# Patient Record
Sex: Female | Born: 1935 | Race: White | Hispanic: No | Smoking: Former smoker
Health system: Southern US, Community
[De-identification: ages and names within clinical notes are randomized; demographics above are authoritative.]

## PROBLEM LIST (undated history)

## (undated) ENCOUNTER — Inpatient Hospital Stay: Admission: EM | Payer: Self-pay | Source: Home / Self Care

## (undated) DIAGNOSIS — G500 Trigeminal neuralgia: Secondary | ICD-10-CM

## (undated) DIAGNOSIS — K760 Fatty (change of) liver, not elsewhere classified: Secondary | ICD-10-CM

## (undated) DIAGNOSIS — I2089 Other forms of angina pectoris: Secondary | ICD-10-CM

## (undated) DIAGNOSIS — I340 Nonrheumatic mitral (valve) insufficiency: Secondary | ICD-10-CM

## (undated) DIAGNOSIS — Z5189 Encounter for other specified aftercare: Secondary | ICD-10-CM

## (undated) DIAGNOSIS — Z974 Presence of external hearing-aid: Secondary | ICD-10-CM

## (undated) DIAGNOSIS — Z972 Presence of dental prosthetic device (complete) (partial): Secondary | ICD-10-CM

## (undated) DIAGNOSIS — J449 Chronic obstructive pulmonary disease, unspecified: Secondary | ICD-10-CM

## (undated) DIAGNOSIS — Z87898 Personal history of other specified conditions: Secondary | ICD-10-CM

## (undated) DIAGNOSIS — I517 Cardiomegaly: Secondary | ICD-10-CM

## (undated) DIAGNOSIS — I350 Nonrheumatic aortic (valve) stenosis: Secondary | ICD-10-CM

## (undated) DIAGNOSIS — H8110 Benign paroxysmal vertigo, unspecified ear: Secondary | ICD-10-CM

## (undated) DIAGNOSIS — J45909 Unspecified asthma, uncomplicated: Secondary | ICD-10-CM

## (undated) DIAGNOSIS — Z8719 Personal history of other diseases of the digestive system: Secondary | ICD-10-CM

## (undated) DIAGNOSIS — K219 Gastro-esophageal reflux disease without esophagitis: Secondary | ICD-10-CM

## (undated) DIAGNOSIS — H04123 Dry eye syndrome of bilateral lacrimal glands: Secondary | ICD-10-CM

## (undated) DIAGNOSIS — H269 Unspecified cataract: Secondary | ICD-10-CM

## (undated) DIAGNOSIS — I10 Essential (primary) hypertension: Secondary | ICD-10-CM

## (undated) DIAGNOSIS — N1831 Chronic kidney disease, stage 3a: Secondary | ICD-10-CM

## (undated) DIAGNOSIS — I5032 Chronic diastolic (congestive) heart failure: Secondary | ICD-10-CM

## (undated) DIAGNOSIS — I509 Heart failure, unspecified: Secondary | ICD-10-CM

## (undated) DIAGNOSIS — R011 Cardiac murmur, unspecified: Secondary | ICD-10-CM

## (undated) DIAGNOSIS — C801 Malignant (primary) neoplasm, unspecified: Secondary | ICD-10-CM

## (undated) DIAGNOSIS — D649 Anemia, unspecified: Secondary | ICD-10-CM

## (undated) DIAGNOSIS — I480 Paroxysmal atrial fibrillation: Secondary | ICD-10-CM

## (undated) DIAGNOSIS — I951 Orthostatic hypotension: Secondary | ICD-10-CM

## (undated) DIAGNOSIS — G459 Transient cerebral ischemic attack, unspecified: Secondary | ICD-10-CM

## (undated) DIAGNOSIS — M1990 Unspecified osteoarthritis, unspecified site: Secondary | ICD-10-CM

## (undated) HISTORY — DX: Encounter for other specified aftercare: Z51.89

## (undated) HISTORY — DX: Cardiac murmur, unspecified: R01.1

## (undated) HISTORY — DX: Anemia, unspecified: D64.9

## (undated) HISTORY — DX: Unspecified asthma, uncomplicated: J45.909

## (undated) HISTORY — PX: KNEE SURGERY: SHX244

## (undated) HISTORY — DX: Unspecified cataract: H26.9

## (undated) HISTORY — DX: Unspecified osteoarthritis, unspecified site: M19.90

## (undated) HISTORY — PX: JOINT REPLACEMENT: SHX530

## (undated) HISTORY — DX: Malignant (primary) neoplasm, unspecified: C80.1

## (undated) HISTORY — PX: APPENDECTOMY: SHX54

## (undated) HISTORY — PX: EYE SURGERY: SHX253

---

## 2004-09-23 DIAGNOSIS — E785 Hyperlipidemia, unspecified: Secondary | ICD-10-CM | POA: Insufficient documentation

## 2005-12-04 DIAGNOSIS — M5489 Other dorsalgia: Secondary | ICD-10-CM | POA: Insufficient documentation

## 2008-10-18 DIAGNOSIS — H8110 Benign paroxysmal vertigo, unspecified ear: Secondary | ICD-10-CM | POA: Insufficient documentation

## 2010-08-09 DIAGNOSIS — E669 Obesity, unspecified: Secondary | ICD-10-CM | POA: Insufficient documentation

## 2013-01-07 DIAGNOSIS — H2510 Age-related nuclear cataract, unspecified eye: Secondary | ICD-10-CM | POA: Insufficient documentation

## 2013-06-28 DIAGNOSIS — M47819 Spondylosis without myelopathy or radiculopathy, site unspecified: Secondary | ICD-10-CM | POA: Insufficient documentation

## 2013-10-29 DIAGNOSIS — N183 Chronic kidney disease, stage 3 unspecified: Secondary | ICD-10-CM | POA: Insufficient documentation

## 2013-10-29 DIAGNOSIS — N1831 Chronic kidney disease, stage 3a: Secondary | ICD-10-CM | POA: Diagnosis present

## 2014-11-18 HISTORY — PX: CATARACT EXTRACTION W/ INTRAOCULAR LENS IMPLANT: SHX1309

## 2015-11-08 DIAGNOSIS — I2510 Atherosclerotic heart disease of native coronary artery without angina pectoris: Secondary | ICD-10-CM | POA: Insufficient documentation

## 2016-03-24 DIAGNOSIS — M5412 Radiculopathy, cervical region: Secondary | ICD-10-CM | POA: Insufficient documentation

## 2016-03-24 DIAGNOSIS — G500 Trigeminal neuralgia: Secondary | ICD-10-CM | POA: Insufficient documentation

## 2016-08-22 DIAGNOSIS — E871 Hypo-osmolality and hyponatremia: Secondary | ICD-10-CM | POA: Insufficient documentation

## 2017-01-01 DIAGNOSIS — Z961 Presence of intraocular lens: Secondary | ICD-10-CM | POA: Insufficient documentation

## 2017-01-01 DIAGNOSIS — H353190 Nonexudative age-related macular degeneration, unspecified eye, stage unspecified: Secondary | ICD-10-CM | POA: Insufficient documentation

## 2017-01-01 DIAGNOSIS — E538 Deficiency of other specified B group vitamins: Secondary | ICD-10-CM | POA: Insufficient documentation

## 2017-02-06 DIAGNOSIS — D649 Anemia, unspecified: Secondary | ICD-10-CM | POA: Insufficient documentation

## 2017-03-10 DIAGNOSIS — K2100 Gastro-esophageal reflux disease with esophagitis, without bleeding: Secondary | ICD-10-CM | POA: Insufficient documentation

## 2017-05-05 DIAGNOSIS — K469 Unspecified abdominal hernia without obstruction or gangrene: Secondary | ICD-10-CM | POA: Insufficient documentation

## 2018-03-09 DIAGNOSIS — E042 Nontoxic multinodular goiter: Secondary | ICD-10-CM | POA: Insufficient documentation

## 2018-03-15 DIAGNOSIS — R55 Syncope and collapse: Secondary | ICD-10-CM | POA: Diagnosis present

## 2018-12-22 DIAGNOSIS — E559 Vitamin D deficiency, unspecified: Secondary | ICD-10-CM | POA: Insufficient documentation

## 2018-12-30 DIAGNOSIS — M810 Age-related osteoporosis without current pathological fracture: Secondary | ICD-10-CM | POA: Insufficient documentation

## 2020-08-08 DIAGNOSIS — R42 Dizziness and giddiness: Secondary | ICD-10-CM | POA: Insufficient documentation

## 2020-10-17 ENCOUNTER — Encounter: Payer: Self-pay | Admitting: Emergency Medicine

## 2020-10-17 ENCOUNTER — Other Ambulatory Visit: Payer: Self-pay

## 2020-10-17 ENCOUNTER — Emergency Department
Admission: EM | Admit: 2020-10-17 | Discharge: 2020-10-17 | Disposition: A | Discharge status: Home / Self Care | Payer: Medicare Other | Attending: Emergency Medicine | Admitting: Emergency Medicine

## 2020-10-17 DIAGNOSIS — Z87891 Personal history of nicotine dependence: Secondary | ICD-10-CM | POA: Insufficient documentation

## 2020-10-17 DIAGNOSIS — I10 Essential (primary) hypertension: Secondary | ICD-10-CM | POA: Diagnosis not present

## 2020-10-17 HISTORY — DX: Essential (primary) hypertension: I10

## 2020-10-17 LAB — CBC
HCT: 35 % — ABNORMAL LOW (ref 36.0–46.0)
Hemoglobin: 11.3 g/dL — ABNORMAL LOW (ref 12.0–15.0)
MCH: 29 pg (ref 26.0–34.0)
MCHC: 32.3 g/dL (ref 30.0–36.0)
MCV: 89.7 fL (ref 80.0–100.0)
Platelets: 243 10*3/uL (ref 150–400)
RBC: 3.9 MIL/uL (ref 3.87–5.11)
RDW: 13.3 % (ref 11.5–15.5)
WBC: 5.4 10*3/uL (ref 4.0–10.5)
nRBC: 0 % (ref 0.0–0.2)

## 2020-10-17 LAB — URINALYSIS, COMPLETE (UACMP) WITH MICROSCOPIC
Bilirubin Urine: NEGATIVE
Glucose, UA: NEGATIVE mg/dL
Hgb urine dipstick: NEGATIVE
Ketones, ur: NEGATIVE mg/dL
Nitrite: NEGATIVE
Protein, ur: NEGATIVE mg/dL
Specific Gravity, Urine: 1.005 (ref 1.005–1.030)
pH: 7 (ref 5.0–8.0)

## 2020-10-17 LAB — BASIC METABOLIC PANEL
Anion gap: 10 (ref 5–15)
BUN: 19 mg/dL (ref 8–23)
CO2: 28 mmol/L (ref 22–32)
Calcium: 8.7 mg/dL — ABNORMAL LOW (ref 8.9–10.3)
Chloride: 101 mmol/L (ref 98–111)
Creatinine, Ser: 1.07 mg/dL — ABNORMAL HIGH (ref 0.44–1.00)
GFR, Estimated: 50 mL/min — ABNORMAL LOW (ref >60)
Glucose, Bld: 107 mg/dL — ABNORMAL HIGH (ref 70–99)
Potassium: 3.9 mmol/L (ref 3.5–5.1)
Sodium: 139 mmol/L (ref 135–145)

## 2020-10-17 NOTE — ED Triage Notes (Signed)
Pt to ED via POV with c/o hypertension. Pt reports home reading of 180 systolic. Denies symptoms at this time, however does report had HTN earlier this week and had a syncopal episode. Pt denies injury from syncopal episode.

## 2020-10-17 NOTE — ED Notes (Signed)
Patient verbalizes understanding of discharge instructions. Opportunity for questioning and answers were provided. Armband removed by staff, pt discharged from ED. Wheeled out to lobby with daughter  

## 2020-10-17 NOTE — ED Notes (Addendum)
See triage note. Pt presents to the ED for HTN. Pt states she is on medication. Pt denies symptoms at this time. Pt is A&Ox4 and NAD.

## 2020-10-17 NOTE — ED Provider Notes (Signed)
[facility] Emergency Department Provider Note   ____________________________________________   First MD Initiated Contact with Patient 10/17/20 1359     (approximate)  I have reviewed the triage vital signs and the nursing notes.   HISTORY  Chief Complaint Hypertension    HPI Tricia Edwards is a 86 y.o. female with past medical history of orthostatic hypotension and trigeminal neuralgia who presents to the ED complaining of hypertension.  Majority of history is obtained from patient's daughter, who states that the patient's blood pressure has been running high for the past 3 to 4 days.  She initially had a near syncopal episode 5 days ago, which is not unusual for her as she often deals with lightheadedness when rising to a standing position.  She had just stood up and was walking down the hallway, when she started to feel lightheaded and nearly passed out.  She did not have any chest pain or shortness of breath with this episode.  She typically takes Florinef and midodrine to avoid these episodes.  Daughter then noticed that patient's blood pressure has been running high over the past few days.  She has otherwise been feeling well, has not had any further episodes of lightheadedness or near syncope.  She denies any headaches, numbness, or weakness.  She denies any pain.        Past Medical History:  Diagnosis Date  . Hypertension     There are no problems to display for this patient.   Past Surgical History:  Procedure Laterality Date  . APPENDECTOMY    . KNEE SURGERY      Prior to Admission medications   Not on File    Allergies Pneumovax [pneumococcal polysaccharide vaccine] and Pneumococcal vaccine  No family history on file.  Social History Social History   Tobacco Use  . Smoking status: Former Games developer  . Smokeless tobacco: Never Used  Substance Use Topics  . Alcohol use: Not Currently  . Drug use: Not Currently    Review of  Systems  Constitutional: No fever/chills Eyes: No visual changes. ENT: No sore throat. Cardiovascular: Denies chest pain.  Positive for lightheadedness and syncope. Respiratory: Denies shortness of breath. Gastrointestinal: No abdominal pain.  No nausea, no vomiting.  No diarrhea.  No constipation. Genitourinary: Negative for dysuria. Musculoskeletal: Negative for back pain. Skin: Negative for rash. Neurological: Negative for headaches, focal weakness or numbness.  ____________________________________________   PHYSICAL EXAM:  VITAL SIGNS: ED Triage Vitals  Enc Vitals Group     BP 10/17/20 1320 (!) 177/82     Pulse Rate 10/17/20 1320 74     Resp 10/17/20 1320 18     Temp 10/17/20 1320 98.2 F (36.8 C)     Temp Source 10/17/20 1320 Oral     SpO2 10/17/20 1320 98 %     Weight 10/17/20 1313 196 lb (88.9 kg)     Height 10/17/20 1313 5\' 7"  (1.702 m)     Head Circumference --      Peak Flow --      Pain Score 10/17/20 1313 0     Pain Loc --      Pain Edu? --      Excl. in GC? --     Constitutional: Alert and oriented. Eyes: Conjunctivae are normal. Head: Atraumatic. Nose: No congestion/rhinnorhea. Mouth/Throat: Mucous membranes are moist. Neck: Normal ROM Cardiovascular: Normal rate, regular rhythm. Grossly normal heart sounds.  2+ radial pulses bilaterally. Respiratory: Normal respiratory effort.  No retractions.  Lungs CTAB. Gastrointestinal: Soft and nontender. No distention. Genitourinary: deferred Musculoskeletal: No lower extremity tenderness nor edema. Neurologic:  Normal speech and language. No gross focal neurologic deficits are appreciated. Skin:  Skin is warm, dry and intact. No rash noted. Psychiatric: Mood and affect are normal. Speech and behavior are normal.  ____________________________________________   LABS (all labs ordered are listed, but only abnormal results are displayed)  Labs Reviewed  BASIC METABOLIC PANEL - Abnormal; Notable for the  following components:      Result Value   Glucose, Bld 107 (*)    Creatinine, Ser 1.07 (*)    Calcium 8.7 (*)    GFR, Estimated 50 (*)    All other components within normal limits  CBC - Abnormal; Notable for the following components:   Hemoglobin 11.3 (*)    HCT 35.0 (*)    All other components within normal limits  URINALYSIS, COMPLETE (UACMP) WITH MICROSCOPIC - Abnormal; Notable for the following components:   Color, Urine STRAW (*)    APPearance CLEAR (*)    Leukocytes,Ua MODERATE (*)    Bacteria, UA RARE (*)    All other components within normal limits   ____________________________________________  EKG  ED ECG REPORT I, [name], the attending physician, personally viewed and interpreted this ECG.   Date: 10/17/2020  EKG Time: 13:16  Rate: 73  Rhythm: normal EKG, normal sinus rhythm, unchanged from previous tracings  Axis: Normal  Intervals:none  ST&T Change: None   PROCEDURES  Procedure(s) performed (including Critical Care):  Procedures   ____________________________________________   INITIAL IMPRESSION / ASSESSMENT AND PLAN / ED COURSE       86 year old female with past medical history of orthostatic hypotension and trigeminal neuralgia who presents to the ED complaining of elevated blood pressure for the past few days along with a syncopal episode earlier this week.  Patient is asymptomatic at this time, states that the lightheadedness and syncopal episode earlier this week is very typical for her with her orthostatic hypotension.  She has no focal neurologic deficits on exam to suggest stroke, no headache to raise concern for intracranial hemorrhage.  She denies any chest pain or shortness of breath, EKG shows no evidence of arrhythmia or ischemia.  Lab work is unremarkable, shows some elevation in creatinine that is similar to previous.  There is no evidence of hypertensive emergency at this time.  Daughter counseled to have patient hold midodrine  unless the patient's blood pressure is low, otherwise schedule follow-up with her PCP back in [place].  Patient and daughter agree with plan.      ____________________________________________   FINAL CLINICAL IMPRESSION(S) / ED DIAGNOSES  Final diagnoses:  Hypertension, unspecified type     ED Discharge Orders    None       Note:  This document was prepared using Dragon voice recognition software and may include unintentional dictation errors.   [name], MD 10/17/20 1438

## 2021-01-08 ENCOUNTER — Emergency Department
Admission: EM | Admit: 2021-01-08 | Discharge: 2021-01-08 | Disposition: A | Discharge status: Home / Self Care | Payer: Medicare Other | Attending: Emergency Medicine | Admitting: Emergency Medicine

## 2021-01-08 ENCOUNTER — Emergency Department: Payer: Medicare Other

## 2021-01-08 ENCOUNTER — Other Ambulatory Visit: Payer: Self-pay

## 2021-01-08 DIAGNOSIS — I10 Essential (primary) hypertension: Secondary | ICD-10-CM | POA: Insufficient documentation

## 2021-01-08 DIAGNOSIS — Z87891 Personal history of nicotine dependence: Secondary | ICD-10-CM | POA: Insufficient documentation

## 2021-01-08 DIAGNOSIS — R55 Syncope and collapse: Secondary | ICD-10-CM | POA: Insufficient documentation

## 2021-01-08 DIAGNOSIS — E860 Dehydration: Secondary | ICD-10-CM | POA: Diagnosis not present

## 2021-01-08 LAB — CBC
HCT: 31.5 % — ABNORMAL LOW (ref 36.0–46.0)
Hemoglobin: 10.1 g/dL — ABNORMAL LOW (ref 12.0–15.0)
MCH: 28.5 pg (ref 26.0–34.0)
MCHC: 32.1 g/dL (ref 30.0–36.0)
MCV: 88.7 fL (ref 80.0–100.0)
Platelets: 242 10*3/uL (ref 150–400)
RBC: 3.55 MIL/uL — ABNORMAL LOW (ref 3.87–5.11)
RDW: 13.6 % (ref 11.5–15.5)
WBC: 5.3 10*3/uL (ref 4.0–10.5)
nRBC: 0 % (ref 0.0–0.2)

## 2021-01-08 LAB — BASIC METABOLIC PANEL
Anion gap: 9 (ref 5–15)
BUN: 19 mg/dL (ref 8–23)
CO2: 25 mmol/L (ref 22–32)
Calcium: 8.6 mg/dL — ABNORMAL LOW (ref 8.9–10.3)
Chloride: 100 mmol/L (ref 98–111)
Creatinine, Ser: 1.24 mg/dL — ABNORMAL HIGH (ref 0.44–1.00)
GFR, Estimated: 42 mL/min — ABNORMAL LOW (ref >60)
Glucose, Bld: 133 mg/dL — ABNORMAL HIGH (ref 70–99)
Potassium: 3.5 mmol/L (ref 3.5–5.1)
Sodium: 134 mmol/L — ABNORMAL LOW (ref 135–145)

## 2021-01-08 LAB — TROPONIN I (HIGH SENSITIVITY)
Troponin I (High Sensitivity): 20 ng/L — ABNORMAL HIGH (ref <18)
Troponin I (High Sensitivity): 17 ng/L (ref <18)

## 2021-01-08 MED ORDER — MIDODRINE HCL 5 MG PO TABS
2.5000 mg | ORAL_TABLET | ORAL | Status: DC
  Filled 2021-01-08 (×2): qty 0.5

## 2021-01-08 MED ORDER — LACTATED RINGERS IV BOLUS
1000.0000 mL | Freq: Once | INTRAVENOUS | Status: AC
  Administered 2021-01-08: 1000 mL via INTRAVENOUS

## 2021-01-08 NOTE — ED Triage Notes (Signed)
Pt comes with c/o weakness and LOC. Family reports pt usually puts her head back breaths heavy and then passes out. Pt states fatigue following incidents.  Pt denies any CP or SOB. Pt states some nausea.

## 2021-01-08 NOTE — ED Provider Notes (Signed)
[facility] Emergency Department Provider Note  ____________________________________________   Event Date/Time   First MD Initiated Contact with Patient 01/08/21 1704     (approximate)  I have reviewed the triage vital signs and the nursing notes.   HISTORY  Chief Complaint Weakness and Loss of Consciousness   HPI Tricia Edwards is a 87 y.o. 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  Patient's blood pressure reportedly is often in the 90s but will sometimes spike up into the 180s although she is not on any blood pressure lowering medications.  She seemed to be in her usual state of health this morning when she used her exercise bike as usual and went shopping with her daughter but shortly after lunch when they were stopping at home for bathroom break she stated she felt dizzy and was quickly lowered to a chair by family before losing consciousness for less than a minute.  She did not fall or hit her head and has not had any other recent falls or injuries.  When she woke up she still felt like her normal self.  She states she remembers feeling a little bit nauseous before passing out but no other recent or preceding symptoms.  She denies any headache, earache, vision changes, chest pain, cough, shortness of breath, abdominal pain, vomiting, diarrhea, dysuria, rash or focal extremity pain weakness numbness or tingling.  Similar episode did occur last week.  Patient is visiting from [place].       Past Medical History:  Diagnosis Date  . Hypertension     There are no problems to display for this patient.   Past Surgical History:  Procedure Laterality Date  . APPENDECTOMY    . KNEE SURGERY      Prior to Admission medications   Not on File    Allergies Pneumovax  [pneumococcal polysaccharide vaccine] and Pneumococcal vaccine  No family history on file.  Social History Social History   Tobacco Use  . Smoking status: Former Games developer  . Smokeless tobacco: Never Used  Substance Use Topics  . Alcohol use: Not Currently  . Drug use: Not Currently    Review of Systems  Review of Systems  Constitutional: Negative for chills and fever.  HENT: Negative for sore throat.   Eyes: Negative for pain.  Respiratory: Negative for cough and stridor.   Cardiovascular: Negative for chest pain.  Gastrointestinal: Negative for vomiting.  Genitourinary: Negative for dysuria.  Musculoskeletal: Negative for myalgias.  Skin: Negative for rash.  Neurological: Positive for loss of consciousness. Negative for seizures and headaches.  Psychiatric/Behavioral: Negative for suicidal ideas.  All other systems reviewed and are negative.     ____________________________________________   PHYSICAL EXAM:  VITAL SIGNS: ED Triage Vitals  Enc Vitals Group     BP 01/08/21 1355 104/64     Pulse Rate 01/08/21 1355 83     Resp 01/08/21 1355 18     Temp 01/08/21 1355 98.1 F (36.7 C)     Temp Source 01/08/21 1646 Oral     SpO2 01/08/21 1355 100 %     Weight --      Height --      Head Circumference --      Peak Flow --      Pain Score 01/08/21 1356 0  Pain Loc --      Pain Edu? --      Excl. in GC? --    Vitals:   01/08/21 1958 01/08/21 2030  BP: (!) 153/116 (!) 199/92  Pulse: 74 74  Resp:  20  Temp:    SpO2:  99%   Physical Exam Vitals and nursing note reviewed.  Constitutional:      General: She is not in acute distress.    Appearance: She is well-developed and well-nourished.  HENT:     Head: Normocephalic and atraumatic.     Right Ear: External ear normal.     Left Ear: External ear normal.     Nose: Nose normal.     Mouth/Throat:     Mouth: Mucous membranes are dry.  Eyes:     Conjunctiva/sclera: Conjunctivae normal.  Cardiovascular:      Rate and Rhythm: Normal rate and regular rhythm.     Heart sounds: No murmur heard.   Pulmonary:     Effort: Pulmonary effort is normal. No respiratory distress.     Breath sounds: Normal breath sounds.  Abdominal:     Palpations: Abdomen is soft.     Tenderness: There is no abdominal tenderness.  Musculoskeletal:        General: No edema.     Cervical back: Neck supple.  Skin:    General: Skin is warm and dry.     Capillary Refill: Capillary refill takes 2 to 3 seconds.  Neurological:     Mental Status: She is alert and oriented to person, place, and time.  Psychiatric:        Mood and Affect: Mood and affect and mood normal.     Cranial nerves II through XII grossly intact.  No pronator drift.  No finger dysmetria.  Symmetric 5/5 strength of all extremities.  Sensation intact to light touch in all extremities.  Unremarkable unassisted gait.  ____________________________________________   LABS (all labs ordered are listed, but only abnormal results are displayed)  Labs Reviewed  BASIC METABOLIC PANEL - Abnormal; Notable for the following components:      Result Value   Sodium 134 (*)    Glucose, Bld 133 (*)    Creatinine, Ser 1.24 (*)    Calcium 8.6 (*)    GFR, Estimated 42 (*)    All other components within normal limits  CBC - Abnormal; Notable for the following components:   RBC 3.55 (*)    Hemoglobin 10.1 (*)    HCT 31.5 (*)    All other components within normal limits  TROPONIN I (HIGH SENSITIVITY) - Abnormal; Notable for the following components:   Troponin I (High Sensitivity) 20 (*)    All other components within normal limits  URINALYSIS, COMPLETE (UACMP) WITH MICROSCOPIC  CBG MONITORING, ED  TROPONIN I (HIGH SENSITIVITY)   ____________________________________________  EKG  Sinus rhythm ventricular rate of 79, normal axis, unremarkable intervals, no clear evidence of acute ischemia or other significant underlying arrhythmia although is a nonspecific  change in lead III. ____________________________________________  RADIOLOGY  ED MD interpretation: No clear focal consolidation, effusion, significant edema or any other clear acute intrathoracic process.  Official radiology report(s): DG Chest 2 View  Result Date: 01/08/2021 CLINICAL DATA:  Weakness, syncope, loss of consciousness, fatigue, nausea EXAM: CHEST - 2 VIEW COMPARISON:  None FINDINGS: Upper normal size of cardiac silhouette. Calcified tortuous aorta. Mediastinal contours and pulmonary vascularity normal. Lungs clear. Mild peribronchial thickening. No pleural effusion or pneumothorax. Bones  demineralized with endplate spur formation thoracic spine. IMPRESSION: Bronchitic changes without infiltrate. Aortic Atherosclerosis (ICD10-I70.0). Electronically Signed   By: Ulyses Southward M.D.   On: 01/08/2021 16:19    ____________________________________________   PROCEDURES  Procedure(s) performed (including Critical Care):  Procedures   ____________________________________________   INITIAL IMPRESSION / ASSESSMENT AND PLAN / ED COURSE      Patient presents with above to history and exam with history of labile blood pressures on midodrine and fludrocortisone at baseline but not recently missed doses after a syncopal episode preceded by some dizziness and slightly decreased p.o. intake per patient and daughter.  On arrival she is slightly soft blood pressures in 9/61 otherwise stable vital signs on room air.   Suspect patient may have been orthostatic secondary to poor p.o. intake.  She has no chest pain ECG findings or elevated troponin to suggest ACS.  No evidence of arrhythmia on ECG.  Chest x-ray shows no evidence of heart failure or other acute intrathoracic process and there were no other historical or exam findings to suggest acute cardiomyopathy.  No focal deficits on exam to suggest CVA.  BMP shows a creatinine of 1.24 compared to 1.072 months ago and no other significant  derangements.  CBC shows no leukocytosis and hemoglobin of 10.1 close to baseline 11.32 months ago.  Low suspicion for acute symptomatic anemia.  No fever or other findings on exam or history to suggest acute infectious process.  Following IV hydration patient's blood pressure improved on recheck and she was not orthostatic.  She actually trended to the hypertensive range.  However she remained asymptomatic without any focal deficits.  In addition she was able to ambulate with some assistance and denies any lightheadedness dizziness or other acute sick symptoms.  Given reassuring exam and work-up and likely orthostatic etiology for her syncopal episode of lucency for discharge with plan for continued outpatient follow-up.  Discharged stable condition.  Strict term cautions advised and discussed  ____________________________________________   FINAL CLINICAL IMPRESSION(S) / ED DIAGNOSES  Final diagnoses:  Syncope and collapse  Dehydration    Medications  midodrine (PROAMATINE) tablet 2.5 mg (2.5 mg Oral Not Given 01/08/21 1955)  lactated ringers bolus 1,000 mL (0 mLs Intravenous Stopped 01/08/21 2036)     ED Discharge Orders    None       Note:  This document was prepared using Dragon voice recognition software and may include unintentional dictation errors.   Gilles Chiquito, MD 01/08/21 2106

## 2021-01-08 NOTE — ED Notes (Signed)
Pt and daughter, who is at bedside, agreeable with d/c plan as discussed by provider- this nurse has verbally reinforced d/c instructions and provided pt with written copy - pt acknowledges verbal understanding and denies any additional questions, concerns, needs.  Escorted to waiting room via w/c with daughter to await ride- enroute; ETA 5 minutes

## 2021-01-08 NOTE — ED Triage Notes (Addendum)
Pt comes via EMS from home with c/o syncopal episode for few minutes. Witnessed by family. Family states they were not able to get to respond. Pt has had two episodes for last week of this.  EMS reports 190/90 for BP, HR-86 EKG unremarkable.  CBG-129, 20g in IV

## 2021-01-08 NOTE — ED Notes (Signed)
Patient transported to X-ray 

## 2021-01-18 ENCOUNTER — Observation Stay (HOSPITAL_BASED_OUTPATIENT_CLINIC_OR_DEPARTMENT_OTHER)
Admit: 2021-01-18 | Discharge: 2021-01-18 | Disposition: A | Discharge status: Home / Self Care | Payer: Medicare Other | Attending: Internal Medicine | Admitting: Internal Medicine

## 2021-01-18 ENCOUNTER — Emergency Department: Payer: Medicare Other

## 2021-01-18 ENCOUNTER — Observation Stay
Admission: EM | Admit: 2021-01-18 | Discharge: 2021-01-19 | Disposition: A | Discharge status: Home / Self Care | Payer: Medicare Other | Attending: Obstetrics and Gynecology | Admitting: Obstetrics and Gynecology

## 2021-01-18 ENCOUNTER — Other Ambulatory Visit: Payer: Self-pay

## 2021-01-18 DIAGNOSIS — I951 Orthostatic hypotension: Secondary | ICD-10-CM | POA: Diagnosis not present

## 2021-01-18 DIAGNOSIS — I10 Essential (primary) hypertension: Secondary | ICD-10-CM | POA: Insufficient documentation

## 2021-01-18 DIAGNOSIS — Z87891 Personal history of nicotine dependence: Secondary | ICD-10-CM | POA: Diagnosis not present

## 2021-01-18 DIAGNOSIS — E876 Hypokalemia: Secondary | ICD-10-CM | POA: Diagnosis not present

## 2021-01-18 DIAGNOSIS — Z79899 Other long term (current) drug therapy: Secondary | ICD-10-CM | POA: Diagnosis not present

## 2021-01-18 DIAGNOSIS — R55 Syncope and collapse: Principal | ICD-10-CM | POA: Insufficient documentation

## 2021-01-18 DIAGNOSIS — Z20822 Contact with and (suspected) exposure to covid-19: Secondary | ICD-10-CM | POA: Diagnosis not present

## 2021-01-18 DIAGNOSIS — Z8669 Personal history of other diseases of the nervous system and sense organs: Secondary | ICD-10-CM

## 2021-01-18 LAB — ECHOCARDIOGRAM COMPLETE
AR max vel: 3.16 cm2
AV Area VTI: 4.03 cm2
AV Area mean vel: 3.39 cm2
AV Mean grad: 3 mmHg
AV Peak grad: 5.3 mmHg
Ao pk vel: 1.15 m/s
Area-P 1/2: 3.21 cm2
Height: 67 in
S' Lateral: 2.89 cm
Weight: 3135.82 oz

## 2021-01-18 LAB — CBC WITH DIFFERENTIAL/PLATELET
Abs Immature Granulocytes: 0.04 10*3/uL (ref 0.00–0.07)
Basophils Absolute: 0 10*3/uL (ref 0.0–0.1)
Basophils Relative: 0 %
Eosinophils Absolute: 0.1 10*3/uL (ref 0.0–0.5)
Eosinophils Relative: 1 %
HCT: 31.2 % — ABNORMAL LOW (ref 36.0–46.0)
Hemoglobin: 10 g/dL — ABNORMAL LOW (ref 12.0–15.0)
Immature Granulocytes: 1 %
Lymphocytes Relative: 28 %
Lymphs Abs: 1.6 10*3/uL (ref 0.7–4.0)
MCH: 28.5 pg (ref 26.0–34.0)
MCHC: 32.1 g/dL (ref 30.0–36.0)
MCV: 88.9 fL (ref 80.0–100.0)
Monocytes Absolute: 0.7 10*3/uL (ref 0.1–1.0)
Monocytes Relative: 12 %
Neutro Abs: 3.3 10*3/uL (ref 1.7–7.7)
Neutrophils Relative %: 58 %
Platelets: 226 10*3/uL (ref 150–400)
RBC: 3.51 MIL/uL — ABNORMAL LOW (ref 3.87–5.11)
RDW: 13.8 % (ref 11.5–15.5)
WBC: 5.7 10*3/uL (ref 4.0–10.5)
nRBC: 0 % (ref 0.0–0.2)

## 2021-01-18 LAB — BASIC METABOLIC PANEL
Anion gap: 10 (ref 5–15)
BUN: 19 mg/dL (ref 8–23)
CO2: 26 mmol/L (ref 22–32)
Calcium: 8.2 mg/dL — ABNORMAL LOW (ref 8.9–10.3)
Chloride: 103 mmol/L (ref 98–111)
Creatinine, Ser: 1.12 mg/dL — ABNORMAL HIGH (ref 0.44–1.00)
GFR, Estimated: 47 mL/min — ABNORMAL LOW (ref >60)
Glucose, Bld: 134 mg/dL — ABNORMAL HIGH (ref 70–99)
Potassium: 2.8 mmol/L — ABNORMAL LOW (ref 3.5–5.1)
Sodium: 139 mmol/L (ref 135–145)

## 2021-01-18 LAB — URINALYSIS, COMPLETE (UACMP) WITH MICROSCOPIC
Bacteria, UA: NONE SEEN
Bilirubin Urine: NEGATIVE
Glucose, UA: NEGATIVE mg/dL
Hgb urine dipstick: NEGATIVE
Ketones, ur: NEGATIVE mg/dL
Nitrite: NEGATIVE
Protein, ur: NEGATIVE mg/dL
Specific Gravity, Urine: 1.01 (ref 1.005–1.030)
pH: 7 (ref 5.0–8.0)

## 2021-01-18 LAB — TROPONIN I (HIGH SENSITIVITY)
Troponin I (High Sensitivity): 29 ng/L — ABNORMAL HIGH (ref <18)
Troponin I (High Sensitivity): 140 ng/L (ref <18)
Troponin I (High Sensitivity): 149 ng/L (ref <18)
Troponin I (High Sensitivity): 131 ng/L (ref <18)

## 2021-01-18 LAB — MAGNESIUM: Magnesium: 2.1 mg/dL (ref 1.7–2.4)

## 2021-01-18 LAB — SARS CORONAVIRUS 2 (TAT 6-24 HRS): SARS Coronavirus 2: NEGATIVE

## 2021-01-18 MED ORDER — MIDODRINE HCL 5 MG PO TABS
2.5000 mg | ORAL_TABLET | Freq: Three times a day (TID) | ORAL | Status: DC
  Administered 2021-01-18 – 2021-01-19 (×3): 2.5 mg via ORAL
  Filled 2021-01-18 (×3): qty 0.5
  Filled 2021-01-18: qty 1

## 2021-01-18 MED ORDER — CARBAMAZEPINE ER 200 MG PO TB12
200.0000 mg | ORAL_TABLET | Freq: Three times a day (TID) | ORAL | Status: DC
  Administered 2021-01-18 – 2021-01-19 (×3): 200 mg via ORAL
  Filled 2021-01-18 (×5): qty 1

## 2021-01-18 MED ORDER — ACETAMINOPHEN 650 MG RE SUPP: 650.0000 mg | Freq: Four times a day (QID) | RECTAL | Status: DC | PRN

## 2021-01-18 MED ORDER — SODIUM CHLORIDE 0.9% FLUSH
3.0000 mL | Freq: Two times a day (BID) | INTRAVENOUS | Status: DC
  Administered 2021-01-18 – 2021-01-19 (×2): 3 mL via INTRAVENOUS

## 2021-01-18 MED ORDER — PRAVASTATIN SODIUM 40 MG PO TABS
40.0000 mg | ORAL_TABLET | Freq: Every day | ORAL | Status: DC
  Administered 2021-01-18: 40 mg via ORAL
  Filled 2021-01-18 (×2): qty 1

## 2021-01-18 MED ORDER — ASPIRIN 81 MG PO CHEW
81.0000 mg | CHEWABLE_TABLET | Freq: Every day | ORAL | Status: DC
  Administered 2021-01-18 – 2021-01-19 (×2): 81 mg via ORAL
  Filled 2021-01-18 (×2): qty 1

## 2021-01-18 MED ORDER — FERROUS SULFATE 325 (65 FE) MG PO TABS
324.0000 mg | ORAL_TABLET | ORAL | Status: DC
  Administered 2021-01-19: 324 mg via ORAL
  Filled 2021-01-18: qty 1

## 2021-01-18 MED ORDER — ONDANSETRON HCL 4 MG PO TABS: 4.0000 mg | ORAL_TABLET | Freq: Four times a day (QID) | ORAL | Status: DC | PRN

## 2021-01-18 MED ORDER — SODIUM CHLORIDE 0.9 % IV SOLN: Freq: Once | INTRAVENOUS | Status: AC

## 2021-01-18 MED ORDER — PANTOPRAZOLE SODIUM 40 MG PO TBEC
40.0000 mg | DELAYED_RELEASE_TABLET | Freq: Every day | ORAL | Status: DC
  Administered 2021-01-18 – 2021-01-19 (×2): 40 mg via ORAL
  Filled 2021-01-18 (×2): qty 1

## 2021-01-18 MED ORDER — SODIUM CHLORIDE 0.9 % IV SOLN: INTRAVENOUS | Status: DC

## 2021-01-18 MED ORDER — ACETAMINOPHEN 325 MG PO TABS: 650.0000 mg | ORAL_TABLET | Freq: Four times a day (QID) | ORAL | Status: DC | PRN

## 2021-01-18 MED ORDER — POTASSIUM CHLORIDE CRYS ER 20 MEQ PO TBCR
40.0000 meq | EXTENDED_RELEASE_TABLET | Freq: Once | ORAL | Status: AC
  Administered 2021-01-18: 40 meq via ORAL
  Filled 2021-01-18: qty 2

## 2021-01-18 MED ORDER — ONDANSETRON HCL 4 MG/2ML IJ SOLN: 4.0000 mg | Freq: Four times a day (QID) | INTRAMUSCULAR | Status: DC | PRN

## 2021-01-18 MED ORDER — CALCIUM CARB-CHOLECALCIFEROL 600-400 MG-UNIT PO CHEW: CHEWABLE_TABLET | Freq: Every day | ORAL | Status: DC

## 2021-01-18 MED ORDER — ENOXAPARIN SODIUM 40 MG/0.4ML ~~LOC~~ SOLN
40.0000 mg | SUBCUTANEOUS | Status: DC
  Administered 2021-01-18: 40 mg via SUBCUTANEOUS
  Filled 2021-01-18: qty 0.4

## 2021-01-18 MED ORDER — CALCIUM CARBONATE-VITAMIN D 500-200 MG-UNIT PO TABS
1.0000 | ORAL_TABLET | Freq: Two times a day (BID) | ORAL | Status: DC
  Administered 2021-01-18 – 2021-01-19 (×3): 1 via ORAL
  Filled 2021-01-18 (×4): qty 1

## 2021-01-18 MED ORDER — POLYETHYLENE GLYCOL 3350 17 G PO PACK
17.0000 g | PACK | Freq: Every day | ORAL | Status: DC
  Administered 2021-01-19: 17 g via ORAL
  Filled 2021-01-18 (×2): qty 1

## 2021-01-18 MED ORDER — FLUDROCORTISONE ACETATE 0.1 MG PO TABS
0.1000 mg | ORAL_TABLET | Freq: Every day | ORAL | Status: DC
  Administered 2021-01-18 – 2021-01-19 (×2): 0.1 mg via ORAL
  Filled 2021-01-18 (×2): qty 1

## 2021-01-18 NOTE — ED Triage Notes (Signed)
PT ARRIVES VIA [facility]. PER EMS, CALLED OUT D/T POSSIBLE SYNCOPAL EPISODE AND "SEIZURE LIKE ACTIVITY". PT DENIES PAIN AND C/O NAUSEA. NO HX OF SEIZURES. PT HAS HAD THIS EPISODE MULTIPLE TIMES WITHIN THE LAST MONTH.

## 2021-01-18 NOTE — ED Notes (Signed)
Inpatient RN receiving patient denies questions. Patient awaiting transport to floor by Kathlen Mody, RN.

## 2021-01-18 NOTE — ED Provider Notes (Signed)
[facility] Emergency Department Provider Note   ____________________________________________   I have reviewed the triage vital signs and the nursing notes.   HISTORY  Chief Complaint Near Syncope   History limited by: Not Limited   HPI Tricia Edwards is a 87 y.o. female who presents to the emergency department today because of concerns for a syncopal episode.  The daughter states that she was with her mother and she just walked to the bathroom.  She started feeling dizzy.  She sat down in her walker and then the daughter states she went limp.  The daughter also felt like the patient was doing heavy breathing at this time.  She called 911 and was instructed to place her mother on the ground which she did.  She states that after that her breathing did improve and then her mother regained consciousness.  This is now the third time a similar episode is happened in the past 2 weeks.  Patient had no complaints of any chest pain.  Patient does have issues with blood pressure control and will have varying blood pressures.  Is on midodrine but has not had a dose this morning. No fevers.    Records reviewed. Per medical record review patient has a history of recent ER visits because of concern for near syncope and syncope.   Past Medical History:  Diagnosis Date  . Hypertension     There are no problems to display for this patient.   Past Surgical History:  Procedure Laterality Date  . APPENDECTOMY    . KNEE SURGERY      Prior to Admission medications   Not on File    Allergies Pneumovax [pneumococcal polysaccharide vaccine] and Pneumococcal vaccine  History reviewed. No pertinent family history.  Social History Social History   Tobacco Use  . Smoking status: Former Games developer  . Smokeless tobacco: Never Used  Substance Use Topics  . Alcohol use: Not Currently  . Drug use: Not Currently    Review of Systems Constitutional: No fever/chills Eyes:  No visual changes. ENT: No sore throat. Cardiovascular: Denies chest pain. Respiratory: Denies shortness of breath. Gastrointestinal: No abdominal pain.  No nausea, no vomiting.  No diarrhea.   Genitourinary: Negative for dysuria. Musculoskeletal: Positive for left hip pain.  Skin: Negative for rash. Neurological: Positive for dizziness and syncope.  ____________________________________________   PHYSICAL EXAM:  VITAL SIGNS: ED Triage Vitals  Enc Vitals Group     BP 01/18/21 0755 (!) 169/87     Pulse Rate 01/18/21 0755 88     Resp 01/18/21 0755 15     Temp 01/18/21 0755 (!) 97.3 F (36.3 C)     Temp Source 01/18/21 0755 Oral     SpO2 01/18/21 0755 96 %     Weight 01/18/21 0757 195 lb 15.8 oz (88.9 kg)     Height 01/18/21 0757 5\' 7"  (1.702 m)     Head Circumference --      Peak Flow --      Pain Score 01/18/21 0757 0   Constitutional: Alert and oriented.  Eyes: Conjunctivae are normal.  ENT      Head: Normocephalic and atraumatic.      Nose: No congestion/rhinnorhea.      Mouth/Throat: Mucous membranes are moist.      Neck: No stridor. Hematological/Lymphatic/Immunilogical: No cervical lymphadenopathy. Cardiovascular: Normal rate, regular rhythm.  No murmurs, rubs, or gallops.  Respiratory: Normal respiratory effort without tachypnea nor retractions. Breath sounds are clear and equal  bilaterally. No wheezes/rales/rhonchi. Gastrointestinal: Soft and non tender. No rebound. No guarding.  Genitourinary: Deferred Musculoskeletal: Normal range of motion in all extremities. No lower extremity edema. Neurologic:  Normal speech and language. No gross focal neurologic deficits are appreciated.  Skin:  Skin is warm, dry and intact. No rash noted. Psychiatric: Mood and affect are normal. Speech and behavior are normal. Patient exhibits appropriate insight and judgment.  ____________________________________________    LABS (pertinent positives/negatives)  BMP na 139, k 2.8, glu  134, cr 1.12 Trop hs 29 CBC wbc 5.7, hgb 10.0, plt 226  ____________________________________________   EKG  I, Phineas Semen, attending physician, personally viewed and interpreted this EKG  EKG Time: 0754 Rate: 88 Rhythm: sinus rhythm Axis: left axis deviation Intervals: qtc 478 QRS: narrow ST changes: no st elevation Impression: abnormal ekg  ____________________________________________    RADIOLOGY  CXR No acute abnormality  ____________________________________________   PROCEDURES  Procedures  ____________________________________________   INITIAL IMPRESSION / ASSESSMENT AND PLAN / ED COURSE  Pertinent labs & imaging results that were available during my care of the patient were reviewed by me and considered in my medical decision making (see chart for details).  Patient presented to the emergency department today because of concerns for syncopal episode.  The time my exam patient is awake and alert.  Blood work does show mild hypokalemia.  No obvious etiology of the patient's syncope however given that this is now the third time it has happened in 2 weeks to feel patient would benefit from prolonged monitoring and further work-up.  Discussed this with patient and family.  Will plan admission to the hospitalist service.  ____________________________________________   FINAL CLINICAL IMPRESSION(S) / ED DIAGNOSES  Final diagnoses:  Syncope, unspecified syncope type     Note: This dictation was prepared with Dragon dictation. Any transcriptional errors that result from this process are unintentional     Phineas Semen, MD 01/18/21 1035

## 2021-01-18 NOTE — ED Notes (Signed)
Attempt made to contact patient's daughter to update her about bed assignment at phone number provided on demographics sheet. No answer received.

## 2021-01-18 NOTE — ED Notes (Signed)
Patient transported on monitor to floor by Connye Burkitt, RN.

## 2021-01-18 NOTE — ED Notes (Signed)
Troponin 140- message sent to [place], MD

## 2021-01-18 NOTE — Progress Notes (Signed)
*  PRELIMINARY RESULTS* Echocardiogram 2D Echocardiogram has been performed.  Cristela Blue 01/18/2021, 12:39 PM

## 2021-01-18 NOTE — Consult Note (Signed)
Cardiology Consultation:   Patient ID: Tricia Edwards MRN: [id number]; DOB: [birth year]  Admit date: 01/18/2021 Date of Consult: 01/18/2021  PCP:  Aviva Kluver   [facility]  Cardiologist:  [facility] Physician requesting consult: Dr. Joylene Igo Reason for consult: Syncope, near syncope   Patient Profile:   Tricia Edwards is a 87 y.o. female with a hx of orthostatic hypotension dating back years, syncope, near syncope   History of Present Illness:   Ms. Heatwole with long history of syncope and near syncope, orthostasis managed by cardiologist in [place], on high-dose Florinef daily with midodrine 3 times daily, 3 visits to the emergency room October 17, 2020, febrile 21st 2022 and today  This morning got up in her usual state of health, had not had anything to eat or drink, walking around her kitchen, had orthostasis symptoms, sat down on the chair of her walker, had near syncope/syncope Daughter called 911, they recommended that daughter lay her on the ground which she did, mentation improved EMTs brought her to emergency room In the ER in a supine position blood pressure has been stable at least 160 systolic  Reports that typically she takes her midodrine sometimes later in the morning sometimes after breakfast Has hot coffee first thing  Occasionally has near syncope episodes after lunch One recent episode with out shopping, had near syncope Tries to stay hydrated  Review of records from outpatient cardiology indicates long history of orthostasis going back several years Managed with fludrocortisone, midodrine added last year  Some issues with constipation Unsteady gait, uses a walker   Past Medical History:  Diagnosis Date  . Hypertension     Past Surgical History:  Procedure Laterality Date  . APPENDECTOMY    . KNEE SURGERY       Home Medications:  Prior to Admission medications   Medication Sig Start Date End Date Taking? Authorizing  Provider  Calcium Carb-Cholecalciferol (CALCIUM 600/VITAMIN D PO) Take 1 tablet by mouth daily.   Yes [provider]  carbamazepine (CARBATROL) 200 MG 12 hr capsule Take 200 mg by mouth 3 (three) times daily. 12/25/20  Yes [provider]  ferrous sulfate 324 MG TBEC Take 324 mg by mouth 3 (three) times a week.   Yes [provider]  fludrocortisone (FLORINEF) 0.1 MG tablet TAKE 5 TABLETS BY MOUTH ONCE DAILY 12/25/20  Yes [provider]  midodrine (PROAMATINE) 2.5 MG tablet Take 2.5 mg by mouth 3 (three) times daily. 12/25/20  Yes [provider]  omeprazole (PRILOSEC) 20 MG capsule Take 20 mg by mouth daily.   Yes [provider]  polyethylene glycol (MIRALAX / GLYCOLAX) 17 g packet Take 17 g by mouth daily.   Yes [provider]  pravastatin (PRAVACHOL) 40 MG tablet Take 40 mg by mouth at bedtime. 12/25/20  Yes [provider]    Inpatient Medications: Scheduled Meds: . aspirin  81 mg Oral Daily  . calcium-vitamin D  1 tablet Oral BID  . carbamazepine  200 mg Oral TID  . enoxaparin (LOVENOX) injection  40 mg Subcutaneous Q24H  . [START ON 01/19/2021] ferrous sulfate  324 mg Oral Once per day on Mon Wed Fri  . fludrocortisone  0.1 mg Oral Daily  . midodrine  2.5 mg Oral TID  . pantoprazole  40 mg Oral Daily  . polyethylene glycol  17 g Oral Daily  . potassium chloride  40 mEq Oral Once  . pravastatin  40 mg Oral QHS  . sodium  chloride flush  3 mL Intravenous Q12H   Continuous Infusions: . sodium chloride 100 mL/hr at 01/18/21 1340   PRN Meds: acetaminophen **OR** acetaminophen, ondansetron **OR** ondansetron (ZOFRAN) IV  Allergies:    Allergies  Allergen Reactions  . Pneumovax [Pneumococcal Polysaccharide Vaccine]     Other reaction(s): Severe arm swelling  . Pneumococcal Vaccine     Other reaction(s): Unknown    Social History:   Social History   Socioeconomic History  . Marital status: Widowed    Spouse  name: Not on file  . Number of children: Not on file  . Years of education: Not on file  . Highest education level: Not on file  Occupational History  . Not on file  Tobacco Use  . Smoking status: Former Games developer  . Smokeless tobacco: Never Used  Substance and Sexual Activity  . Alcohol use: Not Currently  . Drug use: Not Currently  . Sexual activity: Not on file  Other Topics Concern  . Not on file  Social History Narrative  . Not on file   Social Determinants of Health   Financial Resource Strain: Not on file  Food Insecurity: Not on file  Transportation Needs: Not on file  Physical Activity: Not on file  Stress: Not on file  Social Connections: Not on file  Intimate Partner Violence: Not on file    Family History:   History reviewed. No pertinent family history.   ROS:  Please see the history of present illness.  Review of Systems  Constitutional: Negative.   HENT: Negative.   Respiratory: Negative.   Cardiovascular: Negative.   Gastrointestinal: Negative.   Musculoskeletal: Negative.   Neurological: Positive for dizziness and loss of consciousness.  Psychiatric/Behavioral: Negative.   All other systems reviewed and are negative.   Physical Exam/Data:   Vitals:   01/18/21 0900 01/18/21 1000 01/18/21 1200 01/18/21 1230  BP: 133/79 (!) 168/92    Pulse: 76 75 86 81  Resp: 14 17 18  (!) 23  Temp:      TempSrc:      SpO2: 92% 97% 96% 98%  Weight:      Height:        Intake/Output Summary (Last 24 hours) at 01/18/2021 1341 Last data filed at 01/18/2021 1339 Gross per 24 hour  Intake 366.67 ml  Output 125 ml  Net 241.67 ml   Last 3 Weights 01/18/2021 10/17/2020  Weight (lbs) 195 lb 15.8 oz 196 lb  Weight (kg) 88.9 kg 88.905 kg     Body mass index is 30.7 kg/m.  General:  Well nourished, well developed, in no acute distress obese HEENT: normal Lymph: no adenopathy Neck: no JVD Endocrine:  No thryomegaly Vascular: No carotid bruits; FA pulses 2+  bilaterally without bruits  Cardiac:  normal S1, S2; RRR; no murmur  Lungs:  clear to auscultation bilaterally, no wheezing, rhonchi or rales  Abd: soft, nontender, no hepatomegaly  Ext: no edema Musculoskeletal:  No deformities, BUE and BLE strength normal and equal Skin: warm and dry  Neuro:  CNs 2-12 intact, no focal abnormalities noted Psych:  Normal affect  EKG:  The EKG was personally reviewed and demonstrates:   NSR with rate 88 bpm, LAD, LVH, no ST or T wave changes  Telemetry:  Telemetry was personally reviewed and demonstrates:   NSR  Relevant CV Studies: Echo pending Normal EF, LVH noted (moderate)  Laboratory Data:  High Sensitivity Troponin:   Recent Labs  Lab 01/08/21 1830 01/08/21 1959 01/18/21 03/20/21  01/18/21 1049  TROPONINIHS 20* 17 29* 140*     Chemistry Recent Labs  Lab 01/18/21 0828  NA 139  K 2.8*  CL 103  CO2 26  GLUCOSE 134*  BUN 19  CREATININE 1.12*  CALCIUM 8.2*  GFRNONAA 47*  ANIONGAP 10    No results for input(s): PROT, ALBUMIN, AST, ALT, ALKPHOS, BILITOT in the last 168 hours. Hematology Recent Labs  Lab 01/18/21 0828  WBC 5.7  RBC 3.51*  HGB 10.0*  HCT 31.2*  MCV 88.9  MCH 28.5  MCHC 32.1  RDW 13.8  PLT 226   BNPNo results for input(s): BNP, PROBNP in the last 168 hours.  DDimer No results for input(s): DDIMER in the last 168 hours.   Radiology/Studies:  DG Chest Portable 1 View  Result Date: 01/18/2021 CLINICAL DATA:  Syncopal episode and seizure-like behavior. Ex-smoker. EXAM: PORTABLE CHEST 1 VIEW COMPARISON:  01/08/2021 FINDINGS: The cardiac silhouette remains borderline enlarged. The aorta remains tortuous and partially calcified. The lungs remain clear with stable mild prominence of the interstitial markings and normal vascularity. Minimal peribronchial thickening is unchanged. Thoracic spine and bilateral shoulder degenerative changes with superior migration of the right humeral head suggesting a chronic rotator cuff  tear. IMPRESSION: 1. No acute abnormality. 2. Stable mild chronic bronchitic changes. Electronically Signed   By: Beckie Salts M.D.   On: 01/18/2021 09:43     Assessment and Plan:   1. Syncope/near-syncope Long discussion with daughter and patient, Suspect some of her symptoms are exacerbated by LVH, small LV cavity --Recommend several changes including taking her midodrine 2.5 mg first thing in the morning with water Wait half an hour before getting out of bed, and before her coffee and breakfast  -Strongly recommend they check orthostatics almost on a daily basis particularly morning time and after lunch For profound drops in blood pressure may need to add additional midodrine 2.5 as needed in addition to the 2.5 3 times daily dosing -We will continue Florinef at current dose for now -Recommend they closely monitor symptoms for orthostasis after meals, try to avoid hot meals, large meals which can cause postprandial symptoms -We did discuss compression hose, abdominal binder  2.  Hypokalemia Will replete Etiology unclear, denies diarrhea or loose bowel movements  3.  Seizure disorder We will continue carbamazepine  4.  Hyperlipidemia On pravastatin 40 daily   Total encounter time more than 110 minutes  Greater than 50% was spent in counseling and coordination of care with the patient  For questions or updates, please contact [facility] Please consult [email or web address] for contact info under    Signed, [name], MD  01/18/2021 1:41 PM

## 2021-01-18 NOTE — ED Notes (Signed)
Patient and daughter at bedside updated on bed assignment and pending lab collection by Kathlen Mody, Charity fundraiser.

## 2021-01-18 NOTE — H&P (Addendum)
History and Physical    Tricia Edwards UXL:[id number] DOB: [birth year] DOA: 01/18/2021  PCP: Pcp, No   Patient coming from: Home  I have personally briefly reviewed patient's old medical records in [facility]  Chief Complaint: " I passed out"  HPI: Tricia Edwards is a 87 y.o. female with medical history significant for trigeminal neuralgia on carbamazepine, orthostatic hypotension, near syncope, chronic labile blood pressure on midodrine and fludrocortisone daily who was brought into the emergency room by EMS for evaluation of recurrent syncopal episodes.   Her daughter states that she has had issues with dizziness in the past related to orthostatic blood pressure changes for which she was started on midodrine and fludrocortisone but in the last 2 weeks patient has had 3 witnessed syncopal episodes. She was recently seen in the emergency room on January 08, 2021 for same but was discharged home.  Patient had walked to the bathroom and started feeling dizzy so she sat down in her rollator and according to the daughter she went limp.  Her daughter called 911 and was instructed to place her mother on the ground which he did.  She states that her mother regained consciousness not too long after that.  This is the third episode the patient has had in the last 2 weeks.   She complains of nausea but denies having any chest pain, no nausea, no vomiting, no diarrhea, no palpitations, no diaphoresis, no abdominal pain, no fever, no chills, no urinary frequency, nocturia, no dysuria, no hematuria, no headache, no blurred vision. Patient is on midodrine daily but has not had a dose prior to this episode.  Her 2 prior episodes happened in the morning but she also had one episode at noon. Labs show sodium 139, potassium 2.8, chloride 103, bicarb 26, glucose 134, BUN 19, creatinine 1.2, calcium 8.2, troponin XX 9, white count 5.7, hemoglobin 10.0, hematocrit 39.2, MCV 81.9, RDW 13.8, platelet count  226 Patient's SARS coronavirus 2 point-of-care test is still pending Chest x-ray reviewed by me shows no acute abnormality. Stable mild chronic bronchitic changes. Twelve-lead EKG reviewed by me shows sinus rhythm with LVH   ED Course: Patient is an 87 year old Caucasian female who presents to the ER via EMS for evaluation of recurrent syncopal episodes.  Patient has had 3 episodes in the last 2 weeks.  She has a history of orthostatic hypotension and usually has dizzy spells for which she was started on midodrine and fludrocortisone.  Her daughter who is her caregiver and healthcare power of attorney states that the syncopal episodes are new and started in the last 2 weeks.  Labs revealed hypokalemia.  She will be referred to observation status for further evaluation.  Review of Systems: As per HPI otherwise all other systems reviewed and negative.    Past Medical History:  Diagnosis Date  . Hypertension     Past Surgical History:  Procedure Laterality Date  . APPENDECTOMY    . KNEE SURGERY       reports that she has quit smoking. She has never used smokeless tobacco. She reports previous alcohol use. She reports previous drug use.  Allergies  Allergen Reactions  . Pneumovax [Pneumococcal Polysaccharide Vaccine]     Other reaction(s): Severe arm swelling  . Pneumococcal Vaccine     Other reaction(s): Unknown    History reviewed. No pertinent family history.    Prior to Admission medications   Medication Sig Start Date End Date Taking? Authorizing Provider  carbamazepine (CARBATROL) 200 MG  12 hr capsule Take 200 mg by mouth 3 (three) times daily. 12/25/20  Yes [provider]  fludrocortisone (FLORINEF) 0.1 MG tablet TAKE 5 TABLETS BY MOUTH ONCE DAILY 12/25/20   [provider]  midodrine (PROAMATINE) 2.5 MG tablet Take 2.5 mg by mouth 3 (three) times daily. 12/25/20   [provider]  pravastatin (PRAVACHOL) 40 MG tablet Take 40 mg by mouth at bedtime.  12/25/20   [provider]    Physical Exam: Vitals:   01/18/21 0755 01/18/21 0757 01/18/21 0900 01/18/21 1000  BP: (!) 169/87  133/79 (!) 168/92  Pulse: 88  76 75  Resp: 15  14 17   Temp: (!) 97.3 F (36.3 C)     TempSrc: Oral     SpO2: 96%  92% 97%  Weight:  88.9 kg    Height:  5\' 7"  (1.702 m)       Vitals:   01/18/21 0755 01/18/21 0757 01/18/21 0900 01/18/21 1000  BP: (!) 169/87  133/79 (!) 168/92  Pulse: 88  76 75  Resp: 15  14 17   Temp: (!) 97.3 F (36.3 C)     TempSrc: Oral     SpO2: 96%  92% 97%  Weight:  88.9 kg    Height:  5\' 7"  (1.702 m)        Constitutional: Alert and oriented x 3 . Not in any apparent distress HEENT:      Head: Normocephalic and atraumatic.         Eyes: PERLA, EOMI, Conjunctivae are normal. Sclera is non-icteric.       Mouth/Throat: Mucous membranes are moist.       Neck: Supple with no signs of meningismus. Cardiovascular: Regular rate and rhythm. No murmurs, gallops, or rubs. 2+ symmetrical distal pulses are present . No JVD. No LE edema Respiratory: Respiratory effort normal.Lungs sounds clear bilaterally. No wheezes, crackles, or rhonchi.  Gastrointestinal: Soft, non tender, and non distended with positive bowel sounds.  Genitourinary: No CVA tenderness. Musculoskeletal: Nontender with normal range of motion in all extremities. No cyanosis, or erythema of extremities. Neurologic:  Face is symmetric. Moving all extremities. No gross focal neurologic deficits  Skin: Skin is warm, dry.  No rash or ulcers Psychiatric: Mood and affect are normal   Labs on Admission: I have personally reviewed following labs and imaging studies  CBC: Recent Labs  Lab 01/18/21 0828  WBC 5.7  NEUTROABS 3.3  HGB 10.0*  HCT 31.2*  MCV 88.9  PLT 226   Basic Metabolic Panel: Recent Labs  Lab 01/18/21 0828  NA 139  K 2.8*  CL 103  CO2 26  GLUCOSE 134*  BUN 19  CREATININE 1.12*  CALCIUM 8.2*   GFR: Estimated Creatinine Clearance:  39.7 mL/min (A) (by C-G formula based on SCr of 1.12 mg/dL (H)). Liver Function Tests: No results for input(s): AST, ALT, ALKPHOS, BILITOT, PROT, ALBUMIN in the last 168 hours. No results for input(s): LIPASE, AMYLASE in the last 168 hours. No results for input(s): AMMONIA in the last 168 hours. Coagulation Profile: No results for input(s): INR, PROTIME in the last 168 hours. Cardiac Enzymes: No results for input(s): CKTOTAL, CKMB, CKMBINDEX, TROPONINI in the last 168 hours. BNP (last 3 results) No results for input(s): PROBNP in the last 8760 hours. HbA1C: No results for input(s): HGBA1C in the last 72 hours. CBG: No results for input(s): GLUCAP in the last 168 hours. Lipid Profile: No results for input(s): CHOL, HDL, LDLCALC, TRIG, CHOLHDL, LDLDIRECT  in the last 72 hours. Thyroid Function Tests: No results for input(s): TSH, T4TOTAL, FREET4, T3FREE, THYROIDAB in the last 72 hours. Anemia Panel: No results for input(s): VITAMINB12, FOLATE, FERRITIN, TIBC, IRON, RETICCTPCT in the last 72 hours. Urine analysis:    Component Value Date/Time   COLORURINE STRAW (A) 10/17/2020 1316   APPEARANCEUR CLEAR (A) 10/17/2020 1316   LABSPEC 1.005 10/17/2020 1316   PHURINE 7.0 10/17/2020 1316   GLUCOSEU NEGATIVE 10/17/2020 1316   HGBUR NEGATIVE 10/17/2020 1316   BILIRUBINUR NEGATIVE 10/17/2020 1316   KETONESUR NEGATIVE 10/17/2020 1316   PROTEINUR NEGATIVE 10/17/2020 1316   NITRITE NEGATIVE 10/17/2020 1316   LEUKOCYTESUR MODERATE (A) 10/17/2020 1316    Radiological Exams on Admission: DG Chest Portable 1 View  Result Date: 01/18/2021 CLINICAL DATA:  Syncopal episode and seizure-like behavior. Ex-smoker. EXAM: PORTABLE CHEST 1 VIEW COMPARISON:  01/08/2021 FINDINGS: The cardiac silhouette remains borderline enlarged. The aorta remains tortuous and partially calcified. The lungs remain clear with stable mild prominence of the interstitial markings and normal vascularity. Minimal peribronchial  thickening is unchanged. Thoracic spine and bilateral shoulder degenerative changes with superior migration of the right humeral head suggesting a chronic rotator cuff tear. IMPRESSION: 1. No acute abnormality. 2. Stable mild chronic bronchitic changes. Electronically Signed   By: Beckie Salts M.D.   On: 01/18/2021 09:43     Assessment/Plan Principal Problem:   Syncope and collapse Active Problems:   Hypokalemia   Chronic orthostatic hypotension   History of trigeminal neuralgia     Syncope and collapse Recurrent ??  Secondary to known orthostatic hypotension Orthostatic blood pressure checks We will place patient on cardiac monitor to rule out arrhythmias Obtain 2D echocardiogram to assess LVEF and rule out aortic valve stenosis We will consult cardiology   History of chronic orthostatic hypotension Continue midodrine and fludrocortisone   Hypokalemia Supplement potassium Obtain magnesium levels   History of trigeminal neuralgia Stable Continue carbamazepine   Elevated troponin Unclear etiology Patient bumped her troponin 29 >> 140 Patient denies having any chest pain or shortness of breath Twelve-lead EKG without any acute changes We will cycle cardiac enzymes and will start patient on a heparin drip if troponin continues to show an upward trend Start patient on aspirin 81 mg daily and continue statins   DVT prophylaxis: Lovenox Code Status: DO NOT RESUSCITATE Family Communication: Greater than 50% of time was spent discussing patient's condition and plan of care with patient and her daughter, Vara Mairena at the bedside.  All questions and concerns have been addressed.  CODE STATUS patient is a DO NOT RESUSCITATE. Disposition Plan: Back to previous home environment Consults called: Cardiology Status: Observation    Golden Emile MD [facility]     01/18/2021, 11:25 AM

## 2021-01-19 DIAGNOSIS — R55 Syncope and collapse: Secondary | ICD-10-CM

## 2021-01-19 DIAGNOSIS — I951 Orthostatic hypotension: Secondary | ICD-10-CM | POA: Diagnosis not present

## 2021-01-19 DIAGNOSIS — E876 Hypokalemia: Secondary | ICD-10-CM | POA: Diagnosis not present

## 2021-01-19 LAB — CBC
HCT: 30.8 % — ABNORMAL LOW (ref 36.0–46.0)
Hemoglobin: 9.8 g/dL — ABNORMAL LOW (ref 12.0–15.0)
MCH: 28.4 pg (ref 26.0–34.0)
MCHC: 31.8 g/dL (ref 30.0–36.0)
MCV: 89.3 fL (ref 80.0–100.0)
Platelets: 205 10*3/uL (ref 150–400)
RBC: 3.45 MIL/uL — ABNORMAL LOW (ref 3.87–5.11)
RDW: 13.9 % (ref 11.5–15.5)
WBC: 4.5 10*3/uL (ref 4.0–10.5)
nRBC: 0 % (ref 0.0–0.2)

## 2021-01-19 LAB — BASIC METABOLIC PANEL
Anion gap: 7 (ref 5–15)
BUN: 18 mg/dL (ref 8–23)
CO2: 27 mmol/L (ref 22–32)
Calcium: 8.6 mg/dL — ABNORMAL LOW (ref 8.9–10.3)
Chloride: 109 mmol/L (ref 98–111)
Creatinine, Ser: 0.94 mg/dL (ref 0.44–1.00)
GFR, Estimated: 58 mL/min — ABNORMAL LOW (ref >60)
Glucose, Bld: 98 mg/dL (ref 70–99)
Potassium: 3.8 mmol/L (ref 3.5–5.1)
Sodium: 143 mmol/L (ref 135–145)

## 2021-01-19 LAB — GLUCOSE, CAPILLARY: Glucose-Capillary: 90 mg/dL (ref 70–99)

## 2021-01-19 MED ORDER — MIDODRINE HCL 2.5 MG PO TABS: 2.5000 mg | ORAL_TABLET | Freq: Three times a day (TID) | ORAL | 0 refills | Status: DC

## 2021-01-19 NOTE — Plan of Care (Signed)
Patient is ready for discharge. Transportation has been arranged and is outside the main hospital entrance. AVS has been discussed in great detail with the patient and daughter. All questions answered to include medication questions. Patient provided with education on new meds and advised on what to expect.  All IV's removed personal belongings returned and patient transported by Clinical research associate to her car where her daughter is waiting to carry her home. Telemetry Box removed, cleaned, and turned in. Patient Discharged.   Problem: Education: Goal: Knowledge of General Education information will improve Description: Including pain rating scale, medication(s)/side effects and non-pharmacologic comfort measures Outcome: Adequate for Discharge   Problem: Health Behavior/Discharge Planning: Goal: Ability to manage health-related needs will improve Outcome: Adequate for Discharge   Problem: Clinical Measurements: Goal: Ability to maintain clinical measurements within normal limits will improve Outcome: Adequate for Discharge Goal: Will remain free from infection Outcome: Adequate for Discharge Goal: Diagnostic test results will improve Outcome: Adequate for Discharge Goal: Respiratory complications will improve Outcome: Adequate for Discharge Goal: Cardiovascular complication will be avoided Outcome: Adequate for Discharge   Problem: Activity: Goal: Risk for activity intolerance will decrease Outcome: Adequate for Discharge   Problem: Nutrition: Goal: Adequate nutrition will be maintained Outcome: Adequate for Discharge   Problem: Coping: Goal: Level of anxiety will decrease Outcome: Adequate for Discharge   Problem: Elimination: Goal: Will not experience complications related to bowel motility Outcome: Adequate for Discharge Goal: Will not experience complications related to urinary retention Outcome: Adequate for Discharge   Problem: Pain Managment: Goal: General experience of comfort  will improve Outcome: Adequate for Discharge   Problem: Safety: Goal: Ability to remain free from injury will improve Outcome: Adequate for Discharge   Problem: Skin Integrity: Goal: Risk for impaired skin integrity will decrease Outcome: Adequate for Discharge   Problem: Education: Goal: Knowledge of General Education information will improve Description: Including pain rating scale, medication(s)/side effects and non-pharmacologic comfort measures Outcome: Adequate for Discharge   Problem: Health Behavior/Discharge Planning: Goal: Ability to manage health-related needs will improve Outcome: Adequate for Discharge   Problem: Clinical Measurements: Goal: Ability to maintain clinical measurements within normal limits will improve Outcome: Adequate for Discharge Goal: Will remain free from infection Outcome: Adequate for Discharge Goal: Diagnostic test results will improve Outcome: Adequate for Discharge Goal: Respiratory complications will improve Outcome: Adequate for Discharge Goal: Cardiovascular complication will be avoided Outcome: Adequate for Discharge   Problem: Activity: Goal: Risk for activity intolerance will decrease Outcome: Adequate for Discharge   Problem: Nutrition: Goal: Adequate nutrition will be maintained Outcome: Adequate for Discharge   Problem: Coping: Goal: Level of anxiety will decrease Outcome: Adequate for Discharge   Problem: Elimination: Goal: Will not experience complications related to bowel motility Outcome: Adequate for Discharge Goal: Will not experience complications related to urinary retention Outcome: Adequate for Discharge   Problem: Pain Managment: Goal: General experience of comfort will improve Outcome: Adequate for Discharge   Problem: Safety: Goal: Ability to remain free from injury will improve Outcome: Adequate for Discharge   Problem: Skin Integrity: Goal: Risk for impaired skin integrity will decrease Outcome:  Adequate for Discharge

## 2021-01-19 NOTE — Discharge Summary (Signed)
Tricia Edwards BEM:[id number] DOB: November 22, 1929 DOA: 01/18/2021  PCP: Pcp, No  Admit date: 01/18/2021 Discharge date: 01/19/2021  Time spent: 35 min minutes  Recommendations for Outpatient Follow-up:  1. F/u with cardiologist later this month as scheduled 2. Will need potassium check at that visit     Discharge Diagnoses:  Principal Problem:   Syncope and collapse Active Problems:   Hypokalemia   Chronic orthostatic hypotension   History of trigeminal neuralgia   Discharge Condition: stable  Diet recommendation: heart healthy  Filed Weights   01/18/21 0757 01/19/21 0417  Weight: 88.9 kg 90.1 kg    History of present illness:  Tricia Edwards is a 87 y.o. female with medical history significant for trigeminal neuralgia on carbamazepine, orthostatic hypotension, near syncope, chronic labile blood pressure on midodrine and fludrocortisone daily who was brought into the emergency room by EMS for evaluation of recurrent syncopal episodes.   Her daughter states that she has had issues with dizziness in the past related to orthostatic blood pressure changes for which she was started on midodrine and fludrocortisone but in the last 2 weeks patient has had 3 witnessed syncopal episodes. She was recently seen in the emergency room on January 08, 2021 for same but was discharged home.  Patient had walked to the bathroom and started feeling dizzy so she sat down in her rollator and according to the daughter she went limp.  Her daughter called 911 and was instructed to place her mother on the ground which he did.  She states that her mother regained consciousness not too long after that.  This is the third episode the patient has had in the last 2 weeks.   She complains of nausea but denies having any chest pain, no nausea, no vomiting, no diarrhea, no palpitations, no diaphoresis, no abdominal pain, no fever, no chills, no urinary frequency, nocturia, no dysuria, no hematuria, no headache, no  blurred vision. Patient is on midodrine daily but has not had a dose prior to this episode.  Her 2 prior episodes happened in the morning but she also had one episode at noon. Labs show sodium 139, potassium 2.8, chloride 103, bicarb 26, glucose 134, BUN 19, creatinine 1.2, calcium 8.2, troponin XX 9, white count 5.7, hemoglobin 10.0, hematocrit 39.2, MCV 81.9, RDW 13.8, platelet count 226 Patient's SARS coronavirus 2 point-of-care test is still pending Chest x-ray reviewed by me shows no acute abnormality. Stable mild chronic bronchitic changes. Twelve-lead EKG reviewed by me shows sinus rhythm with LVH  Hospital Course:  Patient has a history of orthostasis and was admitted for several episodes of orthostatic syncope. CArdiology saw and evaluated, discussed appropriate timing of midodrine, prn use of an additional tab daily, and lifestyle changes (compression hose, abd binder, avoiding large meals, etc.) that can help prevent symptoms. No acute events on telemetry. TTE w/o significant abnormalities. Troponin mildly elevated, thought to be 2/2 demand. hgb stable at around 10. Potassium found to be low, which was repleted and normalized, etiology unclear, possible side effect of florinef. Will need repeat potassium check at cardiology f/u.  Procedures:  none   Consultations:  cardiology  Discharge Exam: Vitals:   01/19/21 0417 01/19/21 0739  BP: (!) 188/75 (!) 173/80  Pulse: 70 72  Resp: 16 18  Temp: 98.2 F (36.8 C) 98.2 F (36.8 C)  SpO2: 96% 97%    General: NAD Cardiovascular: rrr, soft systolic murmur Respiratory: ctab Extremities: warm, trace LE edema  Discharge Instructions   Discharge Instructions  Diet - low sodium heart healthy   Complete by: As directed    Increase activity slowly   Complete by: As directed      Allergies as of 01/19/2021      Reactions   Pneumovax [pneumococcal Polysaccharide Vaccine]    Other reaction(s): Severe arm swelling   Pneumococcal  Vaccine    Other reaction(s): Unknown      Medication List    TAKE these medications   CALCIUM 600/VITAMIN D PO Take 1 tablet by mouth daily.   carbamazepine 200 MG 12 hr capsule Commonly known as: CARBATROL Take 200 mg by mouth 3 (three) times daily.   ferrous sulfate 324 MG Tbec Take 324 mg by mouth 3 (three) times a week.   fludrocortisone 0.1 MG tablet Commonly known as: FLORINEF TAKE 5 TABLETS BY MOUTH ONCE DAILY   midodrine 2.5 MG tablet Commonly known as: PROAMATINE Take 1 tablet (2.5 mg total) by mouth 3 (three) times daily. Can take an additional dose daily as needed What changed: additional instructions   omeprazole 20 MG capsule Commonly known as: PRILOSEC Take 20 mg by mouth daily.   polyethylene glycol 17 g packet Commonly known as: MIRALAX / GLYCOLAX Take 17 g by mouth daily.   pravastatin 40 MG tablet Commonly known as: PRAVACHOL Take 40 mg by mouth at bedtime.      Allergies  Allergen Reactions  . Pneumovax [Pneumococcal Polysaccharide Vaccine]     Other reaction(s): Severe arm swelling  . Pneumococcal Vaccine     Other reaction(s): Unknown    Follow-up Information    Gollan, Tollie Pizza, MD Follow up.   Specialty: Cardiology Contact information: [address] [place] [address] [phone]                The results of significant diagnostics from this hospitalization (including imaging, microbiology, ancillary and laboratory) are listed below for reference.    Significant Diagnostic Studies: DG Chest 2 View  Result Date: 01/08/2021 CLINICAL DATA:  Weakness, syncope, loss of consciousness, fatigue, nausea EXAM: CHEST - 2 VIEW COMPARISON:  None FINDINGS: Upper normal size of cardiac silhouette. Calcified tortuous aorta. Mediastinal contours and pulmonary vascularity normal. Lungs clear. Mild peribronchial thickening. No pleural effusion or pneumothorax. Bones demineralized with endplate spur formation thoracic spine.  IMPRESSION: Bronchitic changes without infiltrate. Aortic Atherosclerosis (ICD10-I70.0). Electronically Signed   By: Ulyses Southward M.D.   On: 01/08/2021 16:19   DG Chest Portable 1 View  Result Date: 01/18/2021 CLINICAL DATA:  Syncopal episode and seizure-like behavior. Ex-smoker. EXAM: PORTABLE CHEST 1 VIEW COMPARISON:  01/08/2021 FINDINGS: The cardiac silhouette remains borderline enlarged. The aorta remains tortuous and partially calcified. The lungs remain clear with stable mild prominence of the interstitial markings and normal vascularity. Minimal peribronchial thickening is unchanged. Thoracic spine and bilateral shoulder degenerative changes with superior migration of the right humeral head suggesting a chronic rotator cuff tear. IMPRESSION: 1. No acute abnormality. 2. Stable mild chronic bronchitic changes. Electronically Signed   By: Beckie Salts M.D.   On: 01/18/2021 09:43   ECHOCARDIOGRAM COMPLETE  Result Date: 01/18/2021    ECHOCARDIOGRAM REPORT   Patient Name:   Tricia Edwards Date of Exam: 01/18/2021 Medical Rec #:  [id number]       Height:       67.0 in Accession #:    [id number]      Weight:       196.0 lb Date of Birth:  01/23/1930  BSA:          2.005 m Patient Age:    87 years        BP:           168/92 mmHg Patient Gender: F               HR:           75 bpm. Exam Location:  [facility] Procedure: 2D Echo, Cardiac Doppler and Color Doppler Indications:     Syncope R55  History:         Patient has no prior history of Echocardiogram examinations.                  Risk Factors:Hypertension.  Sonographer:     Cristela Blue RDCS (AE) Referring Phys:  [id number] Lucile Shutters Diagnosing Phys: [name] MD IMPRESSIONS  1. Left ventricular ejection fraction, by estimation, is 60 to 65%. The left ventricle has normal function. The left ventricle has no regional wall motion abnormalities. There is moderate left ventricular hypertrophy. Left ventricular diastolic parameters are consistent with Grade I  diastolic dysfunction (impaired relaxation).  2. Right ventricular systolic function is normal. The right ventricular size is normal.  3. Left atrial size was mildly dilated. FINDINGS  Left Ventricle: Left ventricular ejection fraction, by estimation, is 60 to 65%. The left ventricle has normal function. The left ventricle has no regional wall motion abnormalities. The left ventricular internal cavity size was normal in size. There is  moderate left ventricular hypertrophy. Left ventricular diastolic parameters are consistent with Grade I diastolic dysfunction (impaired relaxation). Right Ventricle: The right ventricular size is normal. No increase in right ventricular wall thickness. Right ventricular systolic function is normal. Left Atrium: Left atrial size was mildly dilated. Right Atrium: Right atrial size was normal in size. Pericardium: There is no evidence of pericardial effusion. Mitral Valve: The mitral valve is normal in structure. No evidence of mitral valve regurgitation. No evidence of mitral valve stenosis. Tricuspid Valve: The tricuspid valve is normal in structure. Tricuspid valve regurgitation is not demonstrated. No evidence of tricuspid stenosis. Aortic Valve: The aortic valve is normal in structure. Aortic valve regurgitation is not visualized. No aortic stenosis is present. Aortic valve mean gradient measures 3.0 mmHg. Aortic valve peak gradient measures 5.3 mmHg. Aortic valve area, by VTI measures 4.03 cm. Pulmonic Valve: The pulmonic valve was normal in structure. Pulmonic valve regurgitation is not visualized. No evidence of pulmonic stenosis. Aorta: The aortic root is normal in size and structure. Venous: The inferior vena cava is normal in size with greater than 50% respiratory variability, suggesting right atrial pressure of 3 mmHg. IAS/Shunts: No atrial level shunt detected by color flow Doppler.  LEFT VENTRICLE PLAX 2D LVIDd:         4.38 cm  Diastology LVIDs:         2.89 cm  LV e'  medial:    3.81 cm/s LV PW:         1.63 cm  LV E/e' medial:  21.9 LV IVS:        0.99 cm  LV e' lateral:   6.96 cm/s LVOT diam:     2.10 cm  LV E/e' lateral: 12.0 LV SV:         88 LV SV Index:   44 LVOT Area:     3.46 cm  RIGHT VENTRICLE RV Basal diam:  3.33 cm RV S prime:     16.30 cm/s TAPSE (M-mode):  4.4 cm LEFT ATRIUM             Index       RIGHT ATRIUM           Index LA diam:        4.70 cm 2.34 cm/m  RA Area:     20.10 cm LA Vol (A2C):   46.8 ml 23.34 ml/m RA Volume:   53.50 ml  26.68 ml/m LA Vol (A4C):   49.6 ml 24.74 ml/m LA Biplane Vol: 51.2 ml 25.54 ml/m  AORTIC VALVE                   PULMONIC VALVE AV Area (Vmax):    3.16 cm    RVOT Peak grad: 1 mmHg AV Area (Vmean):   3.39 cm AV Area (VTI):     4.03 cm AV Vmax:           115.00 cm/s AV Vmean:          77.100 cm/s AV VTI:            0.219 m AV Peak Grad:      5.3 mmHg AV Mean Grad:      3.0 mmHg LVOT Vmax:         105.00 cm/s LVOT Vmean:        75.500 cm/s LVOT VTI:          0.255 m LVOT/AV VTI ratio: 1.16  AORTA Ao Root diam: 3.10 cm MITRAL VALVE               TRICUSPID VALVE MV Area (PHT): 3.21 cm    TR Peak grad:   13.8 mmHg MV Decel Time: 236 msec    TR Vmax:        186.00 cm/s MV E velocity: 83.60 cm/s MV A velocity: 97.70 cm/s  SHUNTS MV E/A ratio:  0.86        Systemic VTI:  0.26 m                            Systemic Diam: 2.10 cm Julien Nordmannimothy Gollan MD Electronically signed by Julien Nordmannimothy Gollan MD Signature Date/Time: 01/18/2021/8:34:52 PM    Final     Microbiology: Recent Results (from the past 240 hour(s))  SARS CORONAVIRUS 2 (TAT 6-24 HRS) Nasopharyngeal Nasopharyngeal Swab     Status: None   Collection Time: 01/18/21 10:30 AM   Specimen: Nasopharyngeal Swab  Result Value Ref Range Status   SARS Coronavirus 2 NEGATIVE NEGATIVE Final    Comment: (NOTE) SARS-CoV-2 target nucleic acids are NOT DETECTED.  The SARS-CoV-2 RNA is generally detectable in upper and lower respiratory specimens during the acute phase of infection.  Negative results do not preclude SARS-CoV-2 infection, do not rule out co-infections with other pathogens, and should not be used as the sole basis for treatment or other patient management decisions. Negative results must be combined with clinical observations, patient history, and epidemiological information. The expected result is Negative.  Fact Sheet for Patients: HairSlick.nohttps://www.fda.gov/media/138098/download  Fact Sheet for Healthcare Providers: quierodirigir.comhttps://www.fda.gov/media/138095/download  This test is not yet approved or cleared by the [place] FDA and  has been authorized for detection and/or diagnosis of SARS-CoV-2 by FDA under an Emergency Use Authorization (EUA). This EUA will remain  in effect (meaning this test can be used) for the duration of the COVID-19 declaration under Se ction 564(b)(1) of the Act, 21 U.S.C. section 360bbb-3(b)(1), unless the authorization  is terminated or revoked sooner.  Performed at [facility], [address], [place], [place] [address]      Labs: Basic Metabolic Panel: Recent Labs  Lab 01/18/21 0828 01/18/21 1721 01/19/21 0501  NA 139  --  143  K 2.8*  --  3.8  CL 103  --  109  CO2 26  --  27  GLUCOSE 134*  --  98  BUN 19  --  18  CREATININE 1.12*  --  0.94  CALCIUM 8.2*  --  8.6*  MG  --  2.1  --    Liver Function Tests: No results for input(s): AST, ALT, ALKPHOS, BILITOT, PROT, ALBUMIN in the last 168 hours. No results for input(s): LIPASE, AMYLASE in the last 168 hours. No results for input(s): AMMONIA in the last 168 hours. CBC: Recent Labs  Lab 01/18/21 0828 01/19/21 0501  WBC 5.7 4.5  NEUTROABS 3.3  --   HGB 10.0* 9.8*  HCT 31.2* 30.8*  MCV 88.9 89.3  PLT 226 205   Cardiac Enzymes: No results for input(s): CKTOTAL, CKMB, CKMBINDEX, TROPONINI in the last 168 hours. BNP: BNP (last 3 results) No results for input(s): BNP in the last 8760 hours.  ProBNP (last 3 results) No results for input(s): PROBNP  in the last 8760 hours.  CBG: Recent Labs  Lab 01/19/21 0544  GLUCAP 90       Signed:  Silvano Bilis MD.  [facility] 01/19/2021, 10:00 AM

## 2021-01-19 NOTE — Progress Notes (Signed)
Progress Note  Patient Name: Tricia Edwards Date of Encounter: 01/19/2021  [facility] Cardiologist: [facility] Ismahan Lippman  Subjective   Feels well, pains, eating well Was given normal saline fluids starting last night for low blood pressure Blood pressure is actually running high but she has been staying in bed Daughter at the bedside  Inpatient Medications    Scheduled Meds: . aspirin  81 mg Oral Daily  . calcium-vitamin D  1 tablet Oral BID  . carbamazepine  200 mg Oral TID  . enoxaparin (LOVENOX) injection  40 mg Subcutaneous Q24H  . ferrous sulfate  324 mg Oral Once per day on Mon Wed Fri  . fludrocortisone  0.1 mg Oral Daily  . midodrine  2.5 mg Oral TID  . pantoprazole  40 mg Oral Daily  . polyethylene glycol  17 g Oral Daily  . pravastatin  40 mg Oral QHS  . sodium chloride flush  3 mL Intravenous Q12H   Continuous Infusions:  PRN Meds: acetaminophen **OR** acetaminophen, ondansetron **OR** ondansetron (ZOFRAN) IV   Vital Signs    Vitals:   01/18/21 1942 01/19/21 0417 01/19/21 0739 01/19/21 1156  BP: (!) 177/83 (!) 188/75 (!) 173/80 130/75  Pulse: 75 70 72 69  Resp: 15 16 18 18   Temp: 98.4 F (36.9 C) 98.2 F (36.8 C) 98.2 F (36.8 C) 97.7 F (36.5 C)  TempSrc: Oral  Oral Oral  SpO2: 97% 96% 97% 99%  Weight:  90.1 kg    Height:        Intake/Output Summary (Last 24 hours) at 01/19/2021 1240 Last data filed at 01/19/2021 1156 Gross per 24 hour  Intake 1060 ml  Output 2000 ml  Net -940 ml   Last 3 Weights 01/19/2021 01/18/2021 10/17/2020  Weight (lbs) 198 lb 11.2 oz 195 lb 15.8 oz 196 lb  Weight (kg) 90.13 kg 88.9 kg 88.905 kg      Telemetry    Normal sinus rhythm- Personally Reviewed  ECG     - Personally Reviewed  Physical Exam   GEN: No acute distress.  Obese Neck: No JVD Cardiac: RRR, no murmurs, rubs, or gallops.  Respiratory: Clear to auscultation bilaterally. GI: Soft, nontender, non-distended  MS: No edema; No deformity. Neuro:   Nonfocal  Psych: Normal affect   Labs    High Sensitivity Troponin:   Recent Labs  Lab 01/08/21 1959 01/18/21 0828 01/18/21 1049 01/18/21 1503 01/18/21 1721  TROPONINIHS 17 29* 140* 149* 131*      Chemistry Recent Labs  Lab 01/18/21 0828 01/19/21 0501  NA 139 143  K 2.8* 3.8  CL 103 109  CO2 26 27  GLUCOSE 134* 98  BUN 19 18  CREATININE 1.12* 0.94  CALCIUM 8.2* 8.6*  GFRNONAA 47* 58*  ANIONGAP 10 7     Hematology Recent Labs  Lab 01/18/21 0828 01/19/21 0501  WBC 5.7 4.5  RBC 3.51* 3.45*  HGB 10.0* 9.8*  HCT 31.2* 30.8*  MCV 88.9 89.3  MCH 28.5 28.4  MCHC 32.1 31.8  RDW 13.8 13.9  PLT 226 205    BNPNo results for input(s): BNP, PROBNP in the last 168 hours.   DDimer No results for input(s): DDIMER in the last 168 hours.   Radiology    DG Chest Portable 1 View  Result Date: 01/18/2021 CLINICAL DATA:  Syncopal episode and seizure-like behavior. Ex-smoker. EXAM: PORTABLE CHEST 1 VIEW COMPARISON:  01/08/2021 FINDINGS: The cardiac silhouette remains borderline enlarged. The aorta remains tortuous and partially calcified.  The lungs remain clear with stable mild prominence of the interstitial markings and normal vascularity. Minimal peribronchial thickening is unchanged. Thoracic spine and bilateral shoulder degenerative changes with superior migration of the right humeral head suggesting a chronic rotator cuff tear. IMPRESSION: 1. No acute abnormality. 2. Stable mild chronic bronchitic changes. Electronically Signed   By: Beckie Salts M.D.   On: 01/18/2021 09:43   ECHOCARDIOGRAM COMPLETE  Result Date: 01/18/2021    ECHOCARDIOGRAM REPORT   Patient Name:   Tricia Edwards Date of Exam: 01/18/2021 Medical Rec #:  [id number]       Height:       67.0 in Accession #:    [id number]      Weight:       196.0 lb Date of Birth:  06-02-1930       BSA:          2.005 m Patient Age:    87 years        BP:           168/92 mmHg Patient Gender: F               HR:           75 bpm.  Exam Location:  [facility] Procedure: 2D Echo, Cardiac Doppler and Color Doppler Indications:     Syncope R55  History:         Patient has no prior history of Echocardiogram examinations.                  Risk Factors:Hypertension.  Sonographer:     Cristela Blue RDCS (AE) Referring Phys:  [id number] Lucile Shutters Diagnosing Phys: [name] MD IMPRESSIONS  1. Left ventricular ejection fraction, by estimation, is 60 to 65%. The left ventricle has normal function. The left ventricle has no regional wall motion abnormalities. There is moderate left ventricular hypertrophy. Left ventricular diastolic parameters are consistent with Grade I diastolic dysfunction (impaired relaxation).  2. Right ventricular systolic function is normal. The right ventricular size is normal.  3. Left atrial size was mildly dilated. FINDINGS  Left Ventricle: Left ventricular ejection fraction, by estimation, is 60 to 65%. The left ventricle has normal function. The left ventricle has no regional wall motion abnormalities. The left ventricular internal cavity size was normal in size. There is  moderate left ventricular hypertrophy. Left ventricular diastolic parameters are consistent with Grade I diastolic dysfunction (impaired relaxation). Right Ventricle: The right ventricular size is normal. No increase in right ventricular wall thickness. Right ventricular systolic function is normal. Left Atrium: Left atrial size was mildly dilated. Right Atrium: Right atrial size was normal in size. Pericardium: There is no evidence of pericardial effusion. Mitral Valve: The mitral valve is normal in structure. No evidence of mitral valve regurgitation. No evidence of mitral valve stenosis. Tricuspid Valve: The tricuspid valve is normal in structure. Tricuspid valve regurgitation is not demonstrated. No evidence of tricuspid stenosis. Aortic Valve: The aortic valve is normal in structure. Aortic valve regurgitation is not visualized. No aortic stenosis is  present. Aortic valve mean gradient measures 3.0 mmHg. Aortic valve peak gradient measures 5.3 mmHg. Aortic valve area, by VTI measures 4.03 cm. Pulmonic Valve: The pulmonic valve was normal in structure. Pulmonic valve regurgitation is not visualized. No evidence of pulmonic stenosis. Aorta: The aortic root is normal in size and structure. Venous: The inferior vena cava is normal in size with greater than 50% respiratory variability, suggesting right atrial pressure of 3 mmHg. IAS/Shunts:  No atrial level shunt detected by color flow Doppler.  LEFT VENTRICLE PLAX 2D LVIDd:         4.38 cm  Diastology LVIDs:         2.89 cm  LV e' medial:    3.81 cm/s LV PW:         1.63 cm  LV E/e' medial:  21.9 LV IVS:        0.99 cm  LV e' lateral:   6.96 cm/s LVOT diam:     2.10 cm  LV E/e' lateral: 12.0 LV SV:         88 LV SV Index:   44 LVOT Area:     3.46 cm  RIGHT VENTRICLE RV Basal diam:  3.33 cm RV S prime:     16.30 cm/s TAPSE (M-mode): 4.4 cm LEFT ATRIUM             Index       RIGHT ATRIUM           Index LA diam:        4.70 cm 2.34 cm/m  RA Area:     20.10 cm LA Vol (A2C):   46.8 ml 23.34 ml/m RA Volume:   53.50 ml  26.68 ml/m LA Vol (A4C):   49.6 ml 24.74 ml/m LA Biplane Vol: 51.2 ml 25.54 ml/m  AORTIC VALVE                   PULMONIC VALVE AV Area (Vmax):    3.16 cm    RVOT Peak grad: 1 mmHg AV Area (Vmean):   3.39 cm AV Area (VTI):     4.03 cm AV Vmax:           115.00 cm/s AV Vmean:          77.100 cm/s AV VTI:            0.219 m AV Peak Grad:      5.3 mmHg AV Mean Grad:      3.0 mmHg LVOT Vmax:         105.00 cm/s LVOT Vmean:        75.500 cm/s LVOT VTI:          0.255 m LVOT/AV VTI ratio: 1.16  AORTA Ao Root diam: 3.10 cm MITRAL VALVE               TRICUSPID VALVE MV Area (PHT): 3.21 cm    TR Peak grad:   13.8 mmHg MV Decel Time: 236 msec    TR Vmax:        186.00 cm/s MV E velocity: 83.60 cm/s MV A velocity: 97.70 cm/s  SHUNTS MV E/A ratio:  0.86        Systemic VTI:  0.26 m                             Systemic Diam: 2.10 cm [name] MD Electronically signed by [name] MD Signature Date/Time: 01/18/2021/8:34:52 PM    Final     Cardiac Studies     Patient Profile     Ms. Hankinson with long history of syncope and near syncope, orthostasis managed by cardiologist in [place], on high-dose Florinef daily with midodrine 3 times daily, 3 visits to the emergency room October 17, 2020, feb 21st 2022 and today   Assessment & Plan     1. Syncope/near-syncope Underlying LVH, small LV cavity, morbid  obesity, deconditioned Long history of orthostasis, near syncope and syncope dating back several years, managed by cardiology in [place] -Discussed various strategies with him including taking water, midodrine first thing in the morning before getting up Avoiding hot meals, large meals especially if she has not had her midodrine -Consider abdominal binder, compression hose Staying well-hydrated Checking orthostatics on a regular basis especially before going out shopping Extra midodrine as needed for orthostatic numbers  2.  Hypokalemia Repleted, etiology unclear, not on diuretic  3.  Seizure disorder continue carbamazepine  4.  Hyperlipidemia On pravastatin 40 daily  Long discussion concerning discharge planning, she will follow up in the near future [place] cardiology, Plan as above with extra midodrine as needed, close monitoring of blood pressures before ambulating, staying hydrated, getting in midodrine before meals especially first thing in the morning  Total encounter time more than 35 minutes  Greater than 50% was spent in counseling and coordination of care with the patient   For questions or updates, please contact [facility] Please consult [email or web address] for contact info under        Signed, Julien Nordmannimothy Arantza Darrington, MD  01/19/2021, 12:40 PM

## 2021-01-31 NOTE — Progress Notes (Signed)
Cardiology Office Note:    Date:  02/01/2021   ID:  Tricia Edwards, DOB 1930-04-29, MRN [id number]  PCP:  Pcp, No  [facility] Cardiologist:  No primary care provider on file.  [facility] Electrophysiologist:  None   Referring MD: No ref. provider found   Chief Complaint: Hospital follow-up  History of Present Illness:    Tricia Edwards is a 87 y.o. female with a hx of orthostatic hypotension dating back years, murmur, syncope, near syncope who is being seen for hospital follow-up.   She has a long history of orthostasis managed by cardiologist in [place], on high dose Florinef with midodrine 3 times daily, 3 visits to the ER in the last 5 months.   She was admitted for recurrent syncopal episodes, which was new. She would generally feel dizzy but had not passed out before. She had 3 witnessed syncopal episodes. Cardiology saw patient and discussed lifestyle changes such as compression hoes, prn use of midodrine, abd binder, avoiding lrge meals. Echo wnl, no significant valve disease. Telemetry was unremarkable. Troponin mildlly elevated, felt to be demand ischemia. Discharged on fludrocortisone 0.1mg  5 tablets daily and midodrine 2.5mg  TID, which is what she was on before admission. Planned on following with cardiologist in [place] as well.   Today, the patient reports she is doing ok. She gets tired easily, which is not new. She has been taking blood pressures and it shows persistent orthostatic hypotension. Her doses for florinef and midodrine where the same as when she went into the hospital. She wears compression stockings. She will only wear up to the knee, she doesn't like the thigh high because they role down the leg. She has not tried the abdominal binder, says she tried it and it caused back pain. She is will to try something like a corset. Overall her symptoms have seemed to improve with some small lifestyle changes. She now takes her medicine first thing and  then waits 30 minutes before she gets up. Also staying away from hot food. Every time she changes positions she feels dizzy and lightheaded and waits a few seconds until she walks. She uses a Museum/gallery exhibitions officer around the house. She tries to stay hydrated. She is heading back to [facility] for at least 3 months on Saturday and plans to follow-up with cardiology there. PCP apt in June.  Past Medical History:  Diagnosis Date  . Hypertension     Past Surgical History:  Procedure Laterality Date  . APPENDECTOMY    . KNEE SURGERY      Current Medications: Current Meds  Medication Sig  . Calcium Carb-Cholecalciferol (CALCIUM 600/VITAMIN D PO) Take 1 tablet by mouth daily.  . carbamazepine (CARBATROL) 200 MG 12 hr capsule Take 200 mg by mouth 3 (three) times daily.  . ferrous sulfate 324 MG TBEC Take 324 mg by mouth 3 (three) times a week.  . fludrocortisone (FLORINEF) 0.1 MG tablet TAKE 5 TABLETS BY MOUTH ONCE DAILY  . midodrine (PROAMATINE) 2.5 MG tablet Take 1 tablet (2.5 mg total) by mouth 3 (three) times daily. Can take an additional dose daily as needed  . omeprazole (PRILOSEC) 20 MG capsule Take 20 mg by mouth daily.  . polyethylene glycol (MIRALAX / GLYCOLAX) 17 g packet Take 17 g by mouth daily.  . pravastatin (PRAVACHOL) 40 MG tablet Take 40 mg by mouth at bedtime.     Allergies:   Pneumovax [pneumococcal polysaccharide vaccine] and Pneumococcal vaccine   Social History   Socioeconomic History  .  Marital status: Widowed    Spouse name: Not on file  . Number of children: Not on file  . Years of education: Not on file  . Highest education level: Not on file  Occupational History  . Not on file  Tobacco Use  . Smoking status: Former Games developer  . Smokeless tobacco: Never Used  Substance and Sexual Activity  . Alcohol use: Not Currently  . Drug use: Not Currently  . Sexual activity: Not on file  Other Topics Concern  . Not on file  Social History Narrative  . Not on file   Social  Determinants of Health   Financial Resource Strain: Not on file  Food Insecurity: Not on file  Transportation Needs: Not on file  Physical Activity: Not on file  Stress: Not on file  Social Connections: Not on file     Family History: The patient's*family history is not on file.  ROS:   Please see the history of present illness.    All other systems reviewed and are negative.  EKGs/Labs/Other Studies Reviewed:    The following studies were reviewed today:  Echo 01/18/21 Left ventricular ejection fraction, by estimation, is 60 to 65%. The  left ventricle has normal function. The left ventricle has no regional  wall motion abnormalities. There is moderate left ventricular hypertrophy.  Left ventricular diastolic  parameters are consistent with Grade I diastolic dysfunction (impaired  relaxation).  2. Right ventricular systolic function is normal. The right ventricular  size is normal.  3. Left atrial size was mildly dilated.   EKG:  EKG is  ordered today.  The ekg ordered today demonstrates NSR 74 bpm, LAD  Recent Labs: 01/18/2021: Magnesium 2.1 01/19/2021: BUN 18; Creatinine, Ser 0.94; Hemoglobin 9.8; Platelets 205; Potassium 3.8; Sodium 143  Recent Lipid Panel No results found for: CHOL, TRIG, HDL, CHOLHDL, VLDL, LDLCALC, LDLDIRECT  Physical Exam:    VS:  BP 110/60 (BP Location: Left Arm, Patient Position: Sitting, Cuff Size: Normal)   Pulse 74   Ht 5\' 7"  (1.702 m)   Wt 201 lb (91.2 kg)   SpO2 98%   BMI 31.48 kg/m     Wt Readings from Last 3 Encounters:  02/01/21 201 lb (91.2 kg)  01/19/21 198 lb 11.2 oz (90.1 kg)  10/17/20 196 lb (88.9 kg)     GEN:  Well nourished, well developed in no acute distress HEENT: Normal NECK: No JVD; No carotid bruits LYMPHATICS: No lymphadenopathy CARDIAC: RRR, no murmurs, rubs, gallops RESPIRATORY:  Clear to auscultation without rales, wheezing or rhonchi  ABDOMEN: Soft, non-tender, non-distended MUSCULOSKELETAL:  No edema; No  deformity  SKIN: Warm and dry NEUROLOGIC:  Alert and oriented x 3 PSYCHIATRIC:  Normal affect   ASSESSMENT:    1. Chronic orthostatic hypotension   2. Hyperlipidemia, mixed   3. Hypokalemia    PLAN:    In order of problems listed above:  Orthostatic hypotension Recently admitted for orthostatic hypotension with syncope, syncope was new. Echo showed normal LVEF, no significant valve abnormalities. Telemetry was unremarkable. She was discharged on the same medications she was admitted with. Patient is still orthostatic however she feels symptoms are better with minor medication/lifestyle changes. She does not like thigh high stockings but she is willing to try abdominal binder. Review of pressures at home are very labile. Sitting SBP up to 170s but once she stands it can go as low as 10/19/20. Orthostatics positive on exam today. Patient is going to on Saturday. Since patient  feels symptoms are overall better I will not change medications, and she will see cardiology over there. Encouraged good hydration. She can see [place] as needed if she is in town. Patient and daughter are agreeable to plan.   HLD Continue Pravastatin 40 mg daily  Hypokalemia Check BMET   Disposition: Follow up as needed     Signed, Cadence David Stall, PA-C  02/01/2021 1:51 PM    [facility]

## 2021-02-01 ENCOUNTER — Telehealth: Payer: Self-pay | Admitting: *Deleted

## 2021-02-01 ENCOUNTER — Ambulatory Visit (INDEPENDENT_AMBULATORY_CARE_PROVIDER_SITE_OTHER): Payer: Medicare Other | Admitting: Medical

## 2021-02-01 ENCOUNTER — Encounter: Payer: Self-pay | Admitting: Physician Assistant

## 2021-02-01 ENCOUNTER — Other Ambulatory Visit
Admission: RE | Admit: 2021-02-01 | Discharge: 2021-02-01 | Disposition: A | Discharge status: Home / Self Care | Payer: Medicare Other | Source: Ambulatory Visit | Attending: Medical | Admitting: Medical

## 2021-02-01 ENCOUNTER — Other Ambulatory Visit: Payer: Self-pay

## 2021-02-01 VITALS — BP 110/60 | HR 74 | Ht 67.0 in | Wt 201.0 lb

## 2021-02-01 DIAGNOSIS — E876 Hypokalemia: Secondary | ICD-10-CM | POA: Diagnosis present

## 2021-02-01 DIAGNOSIS — I951 Orthostatic hypotension: Secondary | ICD-10-CM

## 2021-02-01 DIAGNOSIS — E782 Mixed hyperlipidemia: Secondary | ICD-10-CM

## 2021-02-01 LAB — BASIC METABOLIC PANEL
Anion gap: 8 (ref 5–15)
BUN: 22 mg/dL (ref 8–23)
CO2: 27 mmol/L (ref 22–32)
Calcium: 8.5 mg/dL — ABNORMAL LOW (ref 8.9–10.3)
Chloride: 101 mmol/L (ref 98–111)
Creatinine, Ser: 1.2 mg/dL — ABNORMAL HIGH (ref 0.44–1.00)
GFR, Estimated: 44 mL/min — ABNORMAL LOW (ref >60)
Glucose, Bld: 115 mg/dL — ABNORMAL HIGH (ref 70–99)
Potassium: 4.1 mmol/L (ref 3.5–5.1)
Sodium: 136 mmol/L (ref 135–145)

## 2021-02-01 NOTE — Telephone Encounter (Signed)
Received orders from Cadence [place], [place] for pt to have repeat Bmet for hypokalemia.  Called and spoke with pt's daughter, [name], who was with pt today at office visit, notified will need repeat lab work. [name] voiced understanding and states they will come [place] the medical mall now to have drawn.  Bmet orders placed for [facility] to complete. Notified [name] that we will call with results.  No further questions at this time.

## 2021-02-01 NOTE — Patient Instructions (Signed)
Medication Instructions:  Your physician recommends that you continue on your current medications as directed. Please refer to the Current Medication list given to you today.  *If you need a refill on your cardiac medications before your next appointment, please call your pharmacy*   Lab Work: None ordered   Testing/Procedures: None ordered   Follow-Up: At [facility], you and your health needs are our priority.  As part of our continuing mission to provide you with exceptional heart care, we have created designated Provider Care Teams.  These Care Teams include your primary Cardiologist (physician) and Advanced Practice Providers (APPs -  Physician Assistants and Nurse Practitioners) who all work together to provide you with the care you need, when you need it.  We recommend signing up for the patient portal called "MyChart".  Sign up information is provided on this After Visit Summary.  MyChart is used to connect with patients for Virtual Visits (Telemedicine).  Patients are able to view lab/test results, encounter notes, upcoming appointments, etc.  Non-urgent messages can be sent to your provider as well.   To learn more about what you can do with MyChart, go to [email or web address].    Your next appointment:    Follow up with cardiologist in [place]

## 2021-02-02 ENCOUNTER — Telehealth: Payer: Self-pay | Admitting: *Deleted

## 2021-02-02 NOTE — Telephone Encounter (Signed)
Spoke to pt's daughter, [name], notified of lab results and provider's recc.  Mary verbalized understanding and has no further questions at this time.

## 2021-02-02 NOTE — Telephone Encounter (Signed)
-----   Message from Cadence David Stall, PA-C sent at 02/02/2021  9:50 AM EDT ----- Please call patient and tell her that potassium is normal, however might have some dehydration. Encourage increased water intake.

## 2021-04-16 ENCOUNTER — Emergency Department
Admission: EM | Admit: 2021-04-16 | Discharge: 2021-04-16 | Disposition: A | Discharge status: Home / Self Care | Payer: Medicare Other | Attending: Emergency Medicine | Admitting: Emergency Medicine

## 2021-04-16 ENCOUNTER — Other Ambulatory Visit: Payer: Self-pay

## 2021-04-16 DIAGNOSIS — Z79899 Other long term (current) drug therapy: Secondary | ICD-10-CM | POA: Insufficient documentation

## 2021-04-16 DIAGNOSIS — G500 Trigeminal neuralgia: Secondary | ICD-10-CM | POA: Insufficient documentation

## 2021-04-16 DIAGNOSIS — N183 Chronic kidney disease, stage 3 unspecified: Secondary | ICD-10-CM | POA: Diagnosis not present

## 2021-04-16 DIAGNOSIS — Z87891 Personal history of nicotine dependence: Secondary | ICD-10-CM | POA: Insufficient documentation

## 2021-04-16 DIAGNOSIS — I129 Hypertensive chronic kidney disease with stage 1 through stage 4 chronic kidney disease, or unspecified chronic kidney disease: Secondary | ICD-10-CM | POA: Diagnosis not present

## 2021-04-16 DIAGNOSIS — R6884 Jaw pain: Secondary | ICD-10-CM | POA: Diagnosis present

## 2021-04-16 LAB — BASIC METABOLIC PANEL
Anion gap: 11 (ref 5–15)
BUN: 15 mg/dL (ref 8–23)
CO2: 28 mmol/L (ref 22–32)
Calcium: 8.8 mg/dL — ABNORMAL LOW (ref 8.9–10.3)
Chloride: 103 mmol/L (ref 98–111)
Creatinine, Ser: 1.08 mg/dL — ABNORMAL HIGH (ref 0.44–1.00)
GFR, Estimated: 49 mL/min — ABNORMAL LOW (ref >60)
Glucose, Bld: 111 mg/dL — ABNORMAL HIGH (ref 70–99)
Potassium: 3.4 mmol/L — ABNORMAL LOW (ref 3.5–5.1)
Sodium: 142 mmol/L (ref 135–145)

## 2021-04-16 LAB — CBC
HCT: 33.6 % — ABNORMAL LOW (ref 36.0–46.0)
Hemoglobin: 10.6 g/dL — ABNORMAL LOW (ref 12.0–15.0)
MCH: 28 pg (ref 26.0–34.0)
MCHC: 31.5 g/dL (ref 30.0–36.0)
MCV: 88.9 fL (ref 80.0–100.0)
Platelets: 210 10*3/uL (ref 150–400)
RBC: 3.78 MIL/uL — ABNORMAL LOW (ref 3.87–5.11)
RDW: 14.2 % (ref 11.5–15.5)
WBC: 5.4 10*3/uL (ref 4.0–10.5)
nRBC: 0 % (ref 0.0–0.2)

## 2021-04-16 MED ORDER — MORPHINE SULFATE (PF) 4 MG/ML IV SOLN
4.0000 mg | Freq: Once | INTRAVENOUS | Status: AC
  Administered 2021-04-16: 4 mg via INTRAVENOUS
  Filled 2021-04-16: qty 1

## 2021-04-16 MED ORDER — SODIUM CHLORIDE 0.9 % IV BOLUS
500.0000 mL | Freq: Once | INTRAVENOUS | Status: AC
  Administered 2021-04-16: 500 mL via INTRAVENOUS

## 2021-04-16 MED ORDER — ONDANSETRON HCL 4 MG/2ML IJ SOLN
4.0000 mg | Freq: Once | INTRAMUSCULAR | Status: AC
  Administered 2021-04-16: 4 mg via INTRAVENOUS
  Filled 2021-04-16: qty 2

## 2021-04-16 NOTE — ED Triage Notes (Addendum)
Pt come with c/o dizziness that started this am when she woke up. Pt denies any SOB of CP.   Pt states ringing in left ear or maybe right. Pt tearful at this time.  Pt also c/o jaw pain. Pt states she takes meds for it, pt unable to tell this RN what meds she takes. Pt states her daugther knows it all.

## 2021-04-16 NOTE — ED Provider Notes (Signed)
[facility] Emergency Department Provider Note   ____________________________________________    I have reviewed the triage vital signs and the nursing notes.   HISTORY  Chief Complaint Dizziness     HPI Tricia Edwards is a 87 y.o. female with history of trigeminal neuralgia who presents with complaints of severe left-sided jaw pain related to her trigeminal neuralgia.  Reportedly per daughter patient has been weaning off of carbamazepine because of episodes of somnolence and has been transitioned to gabapentin.  Is currently on gabapentin 300 mg twice daily.  They are here on vacation, jaw pain became worse, spoke to her on-call neurologist who recommended starting 200 mg of carbamazepine midday which they have done with little improvement.    Past Medical History:  Diagnosis Date  . Hypertension     Patient Active Problem List   Diagnosis Date Noted  . Syncope and collapse 01/18/2021  . Hypokalemia 01/18/2021  . Chronic orthostatic hypotension 01/18/2021  . History of trigeminal neuralgia 01/18/2021  . Osteoporosis 12/30/2018  . Anemia, unspecified 02/06/2017  . 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 Hyponatremia 08/22/2016  . Trigeminal neuralgia 03/24/2016  . Atherosclerotic heart disease of native coronary artery without angina pectoris 11/08/2015  . Stage 3 chronic kidney disease (HCC) 10/29/2013  . Obesity 08/09/2010  . Benign paroxysmal vertigo, unspecified ear 10/18/2008    Past Surgical History:  Procedure Laterality Date  . APPENDECTOMY    . KNEE SURGERY      Prior to Admission medications   Medication Sig Start Date End Date Taking? Authorizing Provider  Calcium Carb-Cholecalciferol (CALCIUM 600/VITAMIN D PO) Take 1 tablet by mouth daily.    [provider]  carbamazepine (CARBATROL) 200 MG 12 hr capsule Take 200 mg by mouth 3 (three) times daily. 12/25/20   [provider]  ferrous sulfate 324 MG TBEC Take  324 mg by mouth 3 (three) times a week.    [provider]  fludrocortisone (FLORINEF) 0.1 MG tablet TAKE 5 TABLETS BY MOUTH ONCE DAILY 12/25/20   [provider]  midodrine (PROAMATINE) 2.5 MG tablet Take 1 tablet (2.5 mg total) by mouth 3 (three) times daily. Can take an additional dose daily as needed 01/19/21   Wouk, Wilfred Curtis, MD  omeprazole (PRILOSEC) 20 MG capsule Take 20 mg by mouth daily.    [provider]  polyethylene glycol (MIRALAX / GLYCOLAX) 17 g packet Take 17 g by mouth daily.    [provider]  pravastatin (PRAVACHOL) 40 MG tablet Take 40 mg by mouth at bedtime. 12/25/20   [provider]     Allergies Pneumovax [pneumococcal polysaccharide vaccine] and Pneumococcal vaccine  No family history on file.  Social History Social History   Tobacco Use  . Smoking status: Former Games developer  . Smokeless tobacco: Never Used  Substance Use Topics  . Alcohol use: Not Currently  . Drug use: Not Currently    Review of Systems  Constitutional: No fever/chills Eyes: No visual changes.  ENT: Sharp jaw pain Cardiovascular: Denies chest pain. Respiratory: Denies shortness of breath. Gastrointestinal: No abdominal pain.     Genitourinary: Negative for dysuria. Musculoskeletal: Negative for back pain. Skin: Negative for rash. Neurological: Negative for headaches or weakness   ____________________________________________   PHYSICAL EXAM:  VITAL SIGNS: ED Triage Vitals  Enc Vitals Group     BP 04/16/21 0851 (!) 190/114     Pulse Rate 04/16/21 0851 87     Resp 04/16/21 0851 18  Temp 04/16/21 0851 98.1 F (36.7 C)     Temp src --      SpO2 04/16/21 0851 99 %     Weight --      Height --      Head Circumference --      Peak Flow --      Pain Score 04/16/21 0848 10     Pain Loc --      Pain Edu? --      Excl. in GC? --     Constitutional: Alert and oriented.  Nose: No congestion/rhinnorhea. Mouth/Throat: Mucous  membranes are moist.    Cardiovascular: Normal rate, regular rhythm.   Good peripheral circulation. Respiratory: Normal respiratory effort.  No retractions.   Musculoskeletal: No lower extremity tenderness nor edema.  Warm and well perfused Neurologic:  Normal speech and language. No gross focal neurologic deficits are appreciated.  Skin:  Skin is warm, dry and intact. No rash noted. Psychiatric: Mood and affect are normal. Speech and behavior are normal.  ____________________________________________   LABS (all labs ordered are listed, but only abnormal results are displayed)  Labs Reviewed  BASIC METABOLIC PANEL - Abnormal; Notable for the following components:      Result Value   Potassium 3.4 (*)    Glucose, Bld 111 (*)    Creatinine, Ser 1.08 (*)    Calcium 8.8 (*)    GFR, Estimated 49 (*)    All other components within normal limits  CBC - Abnormal; Notable for the following components:   RBC 3.78 (*)    Hemoglobin 10.6 (*)    HCT 33.6 (*)    All other components within normal limits  URINALYSIS, COMPLETE (UACMP) WITH MICROSCOPIC  CBG MONITORING, ED   ____________________________________________  EKG  ED ECG REPORT I, Jene Every, the attending physician, personally viewed and interpreted this ECG.  Date: 04/16/2021  Rhythm: normal sinus rhythm QRS Axis: normal Intervals: normal ST/T Wave abnormalities: normal Narrative Interpretation: no evidence of acute ischemia  ____________________________________________  RADIOLOGY  None ____________________________________________   PROCEDURES  Procedure(s) performed: No  Procedures   Critical Care performed: No ____________________________________________   INITIAL IMPRESSION / ASSESSMENT AND PLAN / ED COURSE  Pertinent labs & imaging results that were available during my care of the patient were reviewed by me and considered in my medical decision making (see chart for details).  Patient with  worsening of trigeminal neuralgia likely related to transitioning of medications.  We will attempt to provide pain relief we will give IV morphine, IV Zofran, 500 cc fluid bolus and reevaluate.  Patient symptoms improved after treatment.  Discussed with patient and daughter that no rapid resolution exists for trigeminal neuralgia, they would prefer discharge with continuing to take the gabapentin and carbamazepine.  I know they can return anytime if pain is uncontrollable.   ____________________________________________   FINAL CLINICAL IMPRESSION(S) / ED DIAGNOSES  Final diagnoses:  Trigeminal neuralgia        Note:  This document was prepared using Dragon voice recognition software and may include unintentional dictation errors.   Jene Every, MD 04/16/21 1159

## 2021-04-16 NOTE — ED Notes (Signed)
Pt has been provided with discharge instructions. Pt denies any questions or concerns at this time. Pt verbalizes understanding for follow up care and d/c.  VSS.  Pt left department with all belongings.  

## 2021-05-25 DIAGNOSIS — I5032 Chronic diastolic (congestive) heart failure: Secondary | ICD-10-CM | POA: Insufficient documentation

## 2021-05-25 DIAGNOSIS — I480 Paroxysmal atrial fibrillation: Secondary | ICD-10-CM | POA: Diagnosis present

## 2021-08-14 ENCOUNTER — Emergency Department: Payer: Medicare Other

## 2021-08-14 ENCOUNTER — Other Ambulatory Visit: Payer: Self-pay

## 2021-08-14 ENCOUNTER — Emergency Department
Admission: EM | Admit: 2021-08-14 | Discharge: 2021-08-14 | Disposition: A | Discharge status: Home / Self Care | Payer: Medicare Other | Attending: Emergency Medicine | Admitting: Emergency Medicine

## 2021-08-14 DIAGNOSIS — Z87891 Personal history of nicotine dependence: Secondary | ICD-10-CM | POA: Insufficient documentation

## 2021-08-14 DIAGNOSIS — N183 Chronic kidney disease, stage 3 unspecified: Secondary | ICD-10-CM | POA: Insufficient documentation

## 2021-08-14 DIAGNOSIS — R1013 Epigastric pain: Secondary | ICD-10-CM | POA: Insufficient documentation

## 2021-08-14 DIAGNOSIS — R1310 Dysphagia, unspecified: Secondary | ICD-10-CM

## 2021-08-14 DIAGNOSIS — H8110 Benign paroxysmal vertigo, unspecified ear: Secondary | ICD-10-CM

## 2021-08-14 DIAGNOSIS — I129 Hypertensive chronic kidney disease with stage 1 through stage 4 chronic kidney disease, or unspecified chronic kidney disease: Secondary | ICD-10-CM | POA: Insufficient documentation

## 2021-08-14 DIAGNOSIS — R42 Dizziness and giddiness: Secondary | ICD-10-CM | POA: Diagnosis present

## 2021-08-14 LAB — COMPREHENSIVE METABOLIC PANEL
ALT: 28 U/L (ref 0–44)
AST: 31 U/L (ref 15–41)
Albumin: 3.2 g/dL — ABNORMAL LOW (ref 3.5–5.0)
Alkaline Phosphatase: 115 U/L (ref 38–126)
Anion gap: 9 (ref 5–15)
BUN: 14 mg/dL (ref 8–23)
CO2: 28 mmol/L (ref 22–32)
Calcium: 8.8 mg/dL — ABNORMAL LOW (ref 8.9–10.3)
Chloride: 103 mmol/L (ref 98–111)
Creatinine, Ser: 1.04 mg/dL — ABNORMAL HIGH (ref 0.44–1.00)
GFR, Estimated: 51 mL/min — ABNORMAL LOW (ref >60)
Glucose, Bld: 116 mg/dL — ABNORMAL HIGH (ref 70–99)
Potassium: 4.5 mmol/L (ref 3.5–5.1)
Sodium: 140 mmol/L (ref 135–145)
Total Bilirubin: 0.5 mg/dL (ref 0.3–1.2)
Total Protein: 6.4 g/dL — ABNORMAL LOW (ref 6.5–8.1)

## 2021-08-14 LAB — LIPASE, BLOOD: Lipase: 24 U/L (ref 11–51)

## 2021-08-14 LAB — CBC
HCT: 34.5 % — ABNORMAL LOW (ref 36.0–46.0)
Hemoglobin: 10.5 g/dL — ABNORMAL LOW (ref 12.0–15.0)
MCH: 29.2 pg (ref 26.0–34.0)
MCHC: 30.4 g/dL (ref 30.0–36.0)
MCV: 95.8 fL (ref 80.0–100.0)
Platelets: 256 10*3/uL (ref 150–400)
RBC: 3.6 MIL/uL — ABNORMAL LOW (ref 3.87–5.11)
RDW: 14.8 % (ref 11.5–15.5)
WBC: 6.4 10*3/uL (ref 4.0–10.5)
nRBC: 0 % (ref 0.0–0.2)

## 2021-08-14 LAB — TROPONIN I (HIGH SENSITIVITY): Troponin I (High Sensitivity): 12 ng/L (ref <18)

## 2021-08-14 MED ORDER — MECLIZINE HCL 12.5 MG PO TABS: 12.5000 mg | ORAL_TABLET | Freq: Three times a day (TID) | ORAL | 0 refills | Status: AC | PRN

## 2021-08-14 MED ORDER — MECLIZINE HCL 25 MG PO TABS
12.5000 mg | ORAL_TABLET | Freq: Once | ORAL | Status: AC
  Administered 2021-08-14: 12.5 mg via ORAL
  Filled 2021-08-14: qty 1

## 2021-08-14 MED ORDER — ONDANSETRON 4 MG PO TBDP
4.0000 mg | ORAL_TABLET | Freq: Once | ORAL | Status: AC
  Administered 2021-08-14: 4 mg via ORAL
  Filled 2021-08-14: qty 1

## 2021-08-14 NOTE — Discharge Instructions (Signed)
The dizziness is likely caused by benign paroxysmal positional vertigo.  This is something that is due to an inner ear problem and should resolve on its own.  Her difficulty taking pills may be related to inflammation or a stricture in her esophagus.  Please sit upright when taking medications and drink a lot of water afterward.  Avoid lying down for 30 minutes afterward.  Please follow-up with the GI doctors and your primary care provider as you may need to have an endoscopy to rule out any structural abnormality of your esophagus.  Meanwhile please continue to take the omeprazole.

## 2021-08-14 NOTE — ED Triage Notes (Signed)
Pt c/o epigastric/chest pain with dizziness , "feels like a ball is stuck in there" hx of afib. Having some cough with congestion Pt is in NAD on arrival

## 2021-08-14 NOTE — ED Provider Notes (Signed)
[facility]  ____________________________________________   Event Date/Time   First MD Initiated Contact with Patient 08/14/21 1030     (approximate)  I have reviewed the triage vital signs and the nursing notes.   HISTORY  Chief Complaint Abdominal Pain and Dizziness    HPI Tricia Edwards is a 87 y.o. female past medical history of anemia, macular degeneration, trigeminal neuralgia, PVD who presents with dizziness and dysphagia.  Onset was about 1 week ago.  Patient endorses intermittent spinning sensation.  It is worse when she moves her head in certain directions and when she lays down.  Today she did not want to get out of bed due to the dizzy feeling.  She denies associated diplopia dysarthria or numbness weakness.  Patient over the same timeframe has had difficulty taking her medications.  Feels like they get stuck in her lower esophagus and has a sensation of nausea.  She recently finished a course of doxycycline for sinus infection.  She denies any associated abdominal pain, chest pain, dyspnea.      Past Medical History:  Diagnosis Date   Hypertension     Patient Active Problem List   Diagnosis Date Noted   Syncope and collapse 01/18/2021   Hypokalemia 01/18/2021   Chronic orthostatic hypotension 01/18/2021   History of trigeminal neuralgia 01/18/2021   Osteoporosis 12/30/2018   Anemia, unspecified 02/06/2017   Macular degeneration, dry 01/01/2017   Hyponatremia 08/22/2016   Trigeminal neuralgia 03/24/2016   Atherosclerotic heart disease of native coronary artery without angina pectoris 11/08/2015   Stage 3 chronic kidney disease (HCC) 10/29/2013   Obesity 08/09/2010   Benign paroxysmal vertigo, unspecified ear 10/18/2008    Past Surgical History:  Procedure Laterality Date   APPENDECTOMY     KNEE SURGERY      Prior to Admission medications   Medication Sig Start Date End Date Taking? Authorizing Provider  meclizine (ANTIVERT)  12.5 MG tablet Take 1 tablet (12.5 mg total) by mouth 3 (three) times daily as needed for up to 10 days for dizziness. 08/14/21 08/24/21 Yes Georga Hacking, MD  Calcium Carb-Cholecalciferol (CALCIUM 600/VITAMIN D PO) Take 1 tablet by mouth daily.    [provider]  carbamazepine (CARBATROL) 200 MG 12 hr capsule Take 200 mg by mouth 3 (three) times daily. 12/25/20   [provider]  ferrous sulfate 324 MG TBEC Take 324 mg by mouth 3 (three) times a week.    [provider]  fludrocortisone (FLORINEF) 0.1 MG tablet TAKE 5 TABLETS BY MOUTH ONCE DAILY 12/25/20   [provider]  midodrine (PROAMATINE) 2.5 MG tablet Take 1 tablet (2.5 mg total) by mouth 3 (three) times daily. Can take an additional dose daily as needed 01/19/21   Wouk, Wilfred Curtis, MD  omeprazole (PRILOSEC) 20 MG capsule Take 20 mg by mouth daily.    [provider]  polyethylene glycol (MIRALAX / GLYCOLAX) 17 g packet Take 17 g by mouth daily.    [provider]  pravastatin (PRAVACHOL) 40 MG tablet Take 40 mg by mouth at bedtime. 12/25/20   [provider]    Allergies Pneumovax [pneumococcal polysaccharide vaccine] and Pneumococcal vaccine  No family history on file.  Social History Social History   Tobacco Use   Smoking status: Former   Smokeless tobacco: Never  Substance Use Topics   Alcohol use: Not Currently   Drug use: Not Currently    Review of Systems   Review of Systems  Constitutional:  Negative for appetite change, chills and fever.  Eyes:  Negative for visual disturbance.  Respiratory:  Negative for chest tightness and shortness of breath.   Gastrointestinal:  Positive for nausea. Negative for abdominal pain and vomiting.  Neurological:  Positive for dizziness. Negative for light-headedness, numbness and headaches.  All other systems reviewed and are negative.  Physical Exam Updated Vital Signs BP 124/60 (BP Location: Left Arm)   Pulse 81    Temp 98.2 F (36.8 C) (Oral)   Resp 17   Ht 5\' 7"  (1.702 m)   Wt 83.9 kg   SpO2 97%   BMI 28.98 kg/m   Physical Exam Vitals and nursing note reviewed.  Constitutional:      General: She is not in acute distress.    Appearance: Normal appearance. She is well-developed.  HENT:     Head: Normocephalic and atraumatic.  Eyes:     General: No scleral icterus.    Conjunctiva/sclera: Conjunctivae normal.  Pulmonary:     Effort: Pulmonary effort is normal. No respiratory distress.     Breath sounds: No stridor.  Abdominal:     General: Abdomen is flat.     Palpations: Abdomen is soft.     Comments: Mild tenderness to palpation epigastric region, no guarding  Musculoskeletal:        General: No deformity or signs of injury.     Cervical back: Normal range of motion.  Skin:    General: Skin is dry.     Coloration: Skin is not jaundiced or pale.  Neurological:     General: No focal deficit present.     Mental Status: She is alert and oriented to person, place, and time. Mental status is at baseline.     Comments: Aox3, nml speech  PERRL, EOMI, face symmetric, nml tongue movement  Right-sided horizontal nystagmus, no rotary or vertical nystagmus 5/5 strength in the BL upper and lower extremities  Sensation grossly intact in the BL upper and lower extremities  Finger-nose-finger intact BL Patient ambulates without ataxia   Psychiatric:        Mood and Affect: Mood normal.        Behavior: Behavior normal.     LABS (all labs ordered are listed, but only abnormal results are displayed)  Labs Reviewed  COMPREHENSIVE METABOLIC PANEL - Abnormal; Notable for the following components:      Result Value   Glucose, Bld 116 (*)    Creatinine, Ser 1.04 (*)    Calcium 8.8 (*)    Total Protein 6.4 (*)    Albumin 3.2 (*)    GFR, Estimated 51 (*)    All other components within normal limits  CBC - Abnormal; Notable for the following components:   RBC 3.60 (*)    Hemoglobin 10.5 (*)     HCT 34.5 (*)    All other components within normal limits  LIPASE, BLOOD  URINALYSIS, COMPLETE (UACMP) WITH MICROSCOPIC  TROPONIN I (HIGH SENSITIVITY)  TROPONIN I (HIGH SENSITIVITY)   ____________________________________________  EKG  Atrial fibrillation, controlled rate, diffuse T wave flattening ____________________________________________  RADIOLOGY , personally viewed and evaluated these images (plain radiographs) as part of my medical decision making, as well as reviewing the written report by the radiologist.  ED MD interpretation:  I reviewed the CXR which does not show any acute cardiopulmonary process   Reviewed the abdominal ultrasound which shows cholelithiasis but no evidence of cholecystitis    ____________________________________________   PROCEDURES  Procedure(s) performed (including Critical Care):  Procedures   ____________________________________________   INITIAL IMPRESSION / ASSESSMENT AND PLAN / ED COURSE     87 year old female presents with 2 different complaints, dizziness as well as some dysphagia.  In terms of her dizziness it is clearly positional, worse with head movement and with lying down as well as closing her eyes.  It is intermittent without other associated neurologic symptoms.  On exam she has rightward beating horizontal nystagmus, no vertical nystagmus no cerebellar signs and normal gait.  I am reassured that this is likely BPPV I have very low suspicion for central vertigo.  She was given a dose of meclizine and prescription.  Patient also has a history of this.  She also is having some dysphagia.  For the past week has had some difficulty taking her pills and feels nauseous sometimes vomiting after eating.  She will have her has no frank abdominal pain.  Her abdomen is benign today.  Labs are also reassuring.  A right upper quadrant ultrasound was obtained from triage as again she had some tenderness on their exam  which shows cholelithiasis but no cholecystitis.  EKG shows A. fib but no acute ischemic changes.  Patient was able to tolerate p.o. in the ED.  I suspect that she may have some esophagitis or potential esophageal stricture.  She recently finished a course of doxycycline which could be causing some pill esophagitis.  I advised that she sit upright while taking her pills and drink fluids and stay upright for 30 minutes after.  I also advised that they follow-up with GI as she most likely will need an endoscopy at some point.  She is otherwise stable for discharge      ____________________________________________   FINAL CLINICAL IMPRESSION(S) / ED DIAGNOSES  Final diagnoses:  Benign paroxysmal positional vertigo, unspecified laterality  Dysphagia, unspecified type     ED Discharge Orders          Ordered    meclizine (ANTIVERT) 12.5 MG tablet  3 times daily PRN        08/14/21 1153             Note:  This document was prepared using Dragon voice recognition software and may include unintentional dictation errors.    Georga Hacking, MD 08/14/21 [phone]

## 2021-08-14 NOTE — ED Provider Notes (Signed)
Emergency Medicine Provider Triage Evaluation Note  Tricia Edwards , a 87 y.o. female  was evaluated in triage.  Pt complains of dizziness, weakness, epigastric/chest pain.  History of recent diagnosis of A. fib.  Patient is on a blood thinner.  This patient had recently increased her phenytoin to 200 mg.  Patient recently finished doxycycline for sinus pressure/infection..  Review of Systems  Positive: Chest pain, epigastric pain, dizziness, weakness, AMS per the daughter Negative: Fever, chills, vomiting or diarrhea  Physical Exam  BP (!) 144/83 (BP Location: Left Arm)   Pulse 99   Temp 98.3 F (36.8 C) (Oral)   Resp 18   SpO2 95%  Gen:   Awake, no distress   Resp:  Normal effort  MSK:   Moves extremities without difficulty  Other:  Abdomen is soft, no pulsatile mass noted, tender along the epigastric area  Medical Decision Making  Medically screening exam initiated at 8:15 AM.  Appropriate orders placed.  Tricia Edwards was informed that the remainder of the evaluation will be completed by another provider, this initial triage assessment does not replace that evaluation, and the importance of remaining in the ED until their evaluation is complete.  Patient is a 87 year old female with AMS, chest pain, epigastric pain.  See HPI.  Family history of aneurysms.  We will do ultrasound complete of abdomen to include right upper quadrant due to epigastric pain.   Faythe Ghee, PA-C 08/14/21 [id number]    [name], MD 08/14/21 1534

## 2021-08-21 DIAGNOSIS — K5904 Chronic idiopathic constipation: Secondary | ICD-10-CM | POA: Insufficient documentation

## 2021-12-04 DIAGNOSIS — K760 Fatty (change of) liver, not elsewhere classified: Secondary | ICD-10-CM | POA: Insufficient documentation

## 2022-01-08 ENCOUNTER — Observation Stay
Admission: EM | Admit: 2022-01-08 | Discharge: 2022-01-09 | Disposition: A | Discharge status: Home Health | Payer: Medicare Other | Attending: Internal Medicine | Admitting: Internal Medicine

## 2022-01-08 ENCOUNTER — Emergency Department: Payer: Medicare Other

## 2022-01-08 ENCOUNTER — Other Ambulatory Visit: Payer: Self-pay

## 2022-01-08 DIAGNOSIS — Z79899 Other long term (current) drug therapy: Secondary | ICD-10-CM | POA: Diagnosis not present

## 2022-01-08 DIAGNOSIS — R778 Other specified abnormalities of plasma proteins: Secondary | ICD-10-CM

## 2022-01-08 DIAGNOSIS — I480 Paroxysmal atrial fibrillation: Secondary | ICD-10-CM | POA: Diagnosis present

## 2022-01-08 DIAGNOSIS — M6281 Muscle weakness (generalized): Secondary | ICD-10-CM | POA: Insufficient documentation

## 2022-01-08 DIAGNOSIS — Z20822 Contact with and (suspected) exposure to covid-19: Secondary | ICD-10-CM | POA: Diagnosis not present

## 2022-01-08 DIAGNOSIS — I129 Hypertensive chronic kidney disease with stage 1 through stage 4 chronic kidney disease, or unspecified chronic kidney disease: Secondary | ICD-10-CM | POA: Diagnosis not present

## 2022-01-08 DIAGNOSIS — I4891 Unspecified atrial fibrillation: Secondary | ICD-10-CM | POA: Diagnosis not present

## 2022-01-08 DIAGNOSIS — Z7901 Long term (current) use of anticoagulants: Secondary | ICD-10-CM | POA: Diagnosis not present

## 2022-01-08 DIAGNOSIS — R55 Syncope and collapse: Principal | ICD-10-CM

## 2022-01-08 DIAGNOSIS — N183 Chronic kidney disease, stage 3 unspecified: Secondary | ICD-10-CM | POA: Diagnosis not present

## 2022-01-08 DIAGNOSIS — N1831 Chronic kidney disease, stage 3a: Secondary | ICD-10-CM | POA: Diagnosis present

## 2022-01-08 DIAGNOSIS — Z8669 Personal history of other diseases of the nervous system and sense organs: Secondary | ICD-10-CM

## 2022-01-08 DIAGNOSIS — R7989 Other specified abnormal findings of blood chemistry: Secondary | ICD-10-CM

## 2022-01-08 DIAGNOSIS — N390 Urinary tract infection, site not specified: Secondary | ICD-10-CM | POA: Diagnosis present

## 2022-01-08 DIAGNOSIS — Z87891 Personal history of nicotine dependence: Secondary | ICD-10-CM | POA: Diagnosis not present

## 2022-01-08 DIAGNOSIS — I951 Orthostatic hypotension: Secondary | ICD-10-CM | POA: Diagnosis present

## 2022-01-08 HISTORY — DX: Paroxysmal atrial fibrillation: I48.0

## 2022-01-08 LAB — CBC WITH DIFFERENTIAL/PLATELET
Abs Immature Granulocytes: 0.02 10*3/uL (ref 0.00–0.07)
Basophils Absolute: 0 10*3/uL (ref 0.0–0.1)
Basophils Relative: 0 %
Eosinophils Absolute: 0.1 10*3/uL (ref 0.0–0.5)
Eosinophils Relative: 1 %
HCT: 33.5 % — ABNORMAL LOW (ref 36.0–46.0)
Hemoglobin: 10.1 g/dL — ABNORMAL LOW (ref 12.0–15.0)
Immature Granulocytes: 0 %
Lymphocytes Relative: 26 %
Lymphs Abs: 2 10*3/uL (ref 0.7–4.0)
MCH: 29.3 pg (ref 26.0–34.0)
MCHC: 30.1 g/dL (ref 30.0–36.0)
MCV: 97.1 fL (ref 80.0–100.0)
Monocytes Absolute: 0.9 10*3/uL (ref 0.1–1.0)
Monocytes Relative: 12 %
Neutro Abs: 4.6 10*3/uL (ref 1.7–7.7)
Neutrophils Relative %: 61 %
Platelets: 323 10*3/uL (ref 150–400)
RBC: 3.45 MIL/uL — ABNORMAL LOW (ref 3.87–5.11)
RDW: 12.7 % (ref 11.5–15.5)
WBC: 7.6 10*3/uL (ref 4.0–10.5)
nRBC: 0 % (ref 0.0–0.2)

## 2022-01-08 LAB — COMPREHENSIVE METABOLIC PANEL
ALT: 23 U/L (ref 0–44)
AST: 39 U/L (ref 15–41)
Albumin: 3.5 g/dL (ref 3.5–5.0)
Alkaline Phosphatase: 157 U/L — ABNORMAL HIGH (ref 38–126)
Anion gap: 11 (ref 5–15)
BUN: 14 mg/dL (ref 8–23)
CO2: 24 mmol/L (ref 22–32)
Calcium: 8.4 mg/dL — ABNORMAL LOW (ref 8.9–10.3)
Chloride: 104 mmol/L (ref 98–111)
Creatinine, Ser: 1.28 mg/dL — ABNORMAL HIGH (ref 0.44–1.00)
GFR, Estimated: 40 mL/min — ABNORMAL LOW (ref >60)
Glucose, Bld: 119 mg/dL — ABNORMAL HIGH (ref 70–99)
Potassium: 3.7 mmol/L (ref 3.5–5.1)
Sodium: 139 mmol/L (ref 135–145)
Total Bilirubin: 0.8 mg/dL (ref 0.3–1.2)
Total Protein: 7.4 g/dL (ref 6.5–8.1)

## 2022-01-08 LAB — URINALYSIS, ROUTINE W REFLEX MICROSCOPIC
Bilirubin Urine: NEGATIVE
Glucose, UA: NEGATIVE mg/dL
Hgb urine dipstick: NEGATIVE
Ketones, ur: NEGATIVE mg/dL
Leukocytes,Ua: NEGATIVE
Nitrite: NEGATIVE
Protein, ur: NEGATIVE mg/dL
Specific Gravity, Urine: 1.003 — ABNORMAL LOW (ref 1.005–1.030)
pH: 7 (ref 5.0–8.0)

## 2022-01-08 LAB — TROPONIN I (HIGH SENSITIVITY)
Troponin I (High Sensitivity): 25 ng/L — ABNORMAL HIGH (ref <18)
Troponin I (High Sensitivity): 47 ng/L — ABNORMAL HIGH (ref <18)

## 2022-01-08 LAB — RESP PANEL BY RT-PCR (FLU A&B, COVID) ARPGX2
Influenza A by PCR: NEGATIVE
Influenza B by PCR: NEGATIVE
SARS Coronavirus 2 by RT PCR: NEGATIVE

## 2022-01-08 LAB — LACTIC ACID, PLASMA
Lactic Acid, Venous: 4.6 mmol/L (ref 0.5–1.9)
Lactic Acid, Venous: 1.4 mmol/L (ref 0.5–1.9)
Lactic Acid, Venous: 1.4 mmol/L (ref 0.5–1.9)
Lactic Acid, Venous: 1.8 mmol/L (ref 0.5–1.9)

## 2022-01-08 LAB — DIGOXIN LEVEL: Digoxin Level: 0.7 ng/mL — ABNORMAL LOW (ref 0.8–2.0)

## 2022-01-08 LAB — MAGNESIUM: Magnesium: 2 mg/dL (ref 1.7–2.4)

## 2022-01-08 MED ORDER — FLUDROCORTISONE ACETATE 0.1 MG PO TABS
0.5000 mg | ORAL_TABLET | Freq: Every day | ORAL | Status: DC
  Administered 2022-01-09: 0.5 mg via ORAL
  Filled 2022-01-08: qty 5

## 2022-01-08 MED ORDER — GABAPENTIN 300 MG PO CAPS
300.0000 mg | ORAL_CAPSULE | Freq: Two times a day (BID) | ORAL | Status: DC
Start: 2022-01-08 — End: 2022-01-09
  Administered 2022-01-08 – 2022-01-09 (×2): 300 mg via ORAL
  Filled 2022-01-08 (×2): qty 1

## 2022-01-08 MED ORDER — PANTOPRAZOLE SODIUM 40 MG PO TBEC
40.0000 mg | DELAYED_RELEASE_TABLET | Freq: Every day | ORAL | Status: DC
  Administered 2022-01-08 – 2022-01-09 (×2): 40 mg via ORAL
  Filled 2022-01-08 (×2): qty 1

## 2022-01-08 MED ORDER — SENNA 8.6 MG PO TABS
1.0000 | ORAL_TABLET | Freq: Every day | ORAL | Status: DC | PRN
  Administered 2022-01-09: 8.6 mg via ORAL
  Filled 2022-01-08: qty 1

## 2022-01-08 MED ORDER — ACETAMINOPHEN 650 MG RE SUPP: 650.0000 mg | Freq: Four times a day (QID) | RECTAL | Status: DC | PRN

## 2022-01-08 MED ORDER — SODIUM CHLORIDE 0.9 % IV SOLN
2.0000 g | Freq: Once | INTRAVENOUS | Status: AC
  Administered 2022-01-08: 2 g via INTRAVENOUS
  Filled 2022-01-08: qty 20

## 2022-01-08 MED ORDER — DIGOXIN 125 MCG PO TABS
0.1250 mg | ORAL_TABLET | Freq: Every day | ORAL | Status: DC
  Filled 2022-01-08: qty 1

## 2022-01-08 MED ORDER — DIGOXIN 125 MCG PO TABS
0.1250 mg | ORAL_TABLET | Freq: Once | ORAL | Status: DC
  Filled 2022-01-08: qty 1

## 2022-01-08 MED ORDER — OYSTER SHELL CALCIUM/D3 500-5 MG-MCG PO TABS
ORAL_TABLET | Freq: Every day | ORAL | Status: DC
  Filled 2022-01-08: qty 1

## 2022-01-08 MED ORDER — SODIUM CHLORIDE 0.9 % IV BOLUS
1000.0000 mL | Freq: Once | INTRAVENOUS | Status: AC
  Administered 2022-01-08: 1000 mL via INTRAVENOUS

## 2022-01-08 MED ORDER — CEFDINIR 300 MG PO CAPS
300.0000 mg | ORAL_CAPSULE | Freq: Two times a day (BID) | ORAL | Status: DC
  Administered 2022-01-09: 300 mg via ORAL
  Filled 2022-01-08 (×2): qty 1

## 2022-01-08 MED ORDER — PHENYTOIN SODIUM EXTENDED 100 MG PO CAPS: 200.0000 mg | ORAL_CAPSULE | Freq: Two times a day (BID) | ORAL | Status: DC

## 2022-01-08 MED ORDER — DIGOXIN 250 MCG PO TABS
0.2500 mg | ORAL_TABLET | Freq: Once | ORAL | Status: DC
  Filled 2022-01-08: qty 1

## 2022-01-08 MED ORDER — DIGOXIN 250 MCG PO TABS
0.2500 mg | ORAL_TABLET | Freq: Every day | ORAL | Status: DC
  Filled 2022-01-08: qty 1

## 2022-01-08 MED ORDER — DIGOXIN 250 MCG PO TABS
0.5000 mg | ORAL_TABLET | Freq: Once | ORAL | Status: DC
  Filled 2022-01-08: qty 2

## 2022-01-08 MED ORDER — MIDODRINE HCL 5 MG PO TABS
2.5000 mg | ORAL_TABLET | Freq: Three times a day (TID) | ORAL | Status: DC
  Administered 2022-01-08 – 2022-01-09 (×2): 2.5 mg via ORAL
  Filled 2022-01-08 (×2): qty 1

## 2022-01-08 MED ORDER — PHENYTOIN SODIUM EXTENDED 100 MG PO CAPS
100.0000 mg | ORAL_CAPSULE | Freq: Every day | ORAL | Status: DC
Start: 2022-01-08 — End: 2022-01-09
  Administered 2022-01-08: 100 mg via ORAL
  Filled 2022-01-08 (×2): qty 1

## 2022-01-08 MED ORDER — ONDANSETRON HCL 4 MG PO TABS: 4.0000 mg | ORAL_TABLET | Freq: Four times a day (QID) | ORAL | Status: DC | PRN

## 2022-01-08 MED ORDER — ONDANSETRON HCL 4 MG/2ML IJ SOLN: 4.0000 mg | Freq: Four times a day (QID) | INTRAMUSCULAR | Status: DC | PRN

## 2022-01-08 MED ORDER — PHENYTOIN SODIUM EXTENDED 100 MG PO CAPS
200.0000 mg | ORAL_CAPSULE | Freq: Every day | ORAL | Status: DC
  Administered 2022-01-09: 200 mg via ORAL
  Filled 2022-01-08: qty 2

## 2022-01-08 MED ORDER — PRAVASTATIN SODIUM 20 MG PO TABS: 40.0000 mg | ORAL_TABLET | Freq: Every day | ORAL | Status: DC

## 2022-01-08 MED ORDER — ACETAMINOPHEN 325 MG PO TABS: 650.0000 mg | ORAL_TABLET | Freq: Four times a day (QID) | ORAL | Status: DC | PRN

## 2022-01-08 MED ORDER — FERROUS SULFATE 325 (65 FE) MG PO TABS
324.0000 mg | ORAL_TABLET | ORAL | Status: DC
  Administered 2022-01-09: 324 mg via ORAL
  Filled 2022-01-08: qty 1

## 2022-01-08 MED ORDER — SODIUM CHLORIDE 0.9% FLUSH
3.0000 mL | Freq: Two times a day (BID) | INTRAVENOUS | Status: DC
  Administered 2022-01-08 – 2022-01-09 (×2): 3 mL via INTRAVENOUS

## 2022-01-08 MED ORDER — ATORVASTATIN CALCIUM 10 MG PO TABS
10.0000 mg | ORAL_TABLET | Freq: Every day | ORAL | Status: DC
  Administered 2022-01-08 – 2022-01-09 (×2): 10 mg via ORAL
  Filled 2022-01-08 (×2): qty 1

## 2022-01-08 MED ORDER — APIXABAN 5 MG PO TABS
5.0000 mg | ORAL_TABLET | Freq: Two times a day (BID) | ORAL | Status: DC
  Administered 2022-01-08 – 2022-01-09 (×2): 5 mg via ORAL
  Filled 2022-01-08 (×2): qty 1

## 2022-01-08 NOTE — Assessment & Plan Note (Signed)
>>  ASSESSMENT AND PLAN FOR STAGE 3 CHRONIC KIDNEY DISEASE (HCC) WRITTEN ON 01/08/2022  4:45 PM BY LANETTA LINGO, MD  Renal function is stable Monitor closely

## 2022-01-08 NOTE — H&P (Addendum)
History and Physical    Patient: Tricia Edwards [id number] DOB: 01/23/1930 DOA: 01/08/2022 DOS: the patient was seen and examined on 01/08/2022 PCP: System, Provider Not In  Patient coming from: Home  Chief Complaint:  Chief Complaint  Patient presents with   Loss of Consciousness    HPI: Tricia Edwards is a 88 y.o. female with medical history significant for paroxysmal A-fib on anticoagulation, history of orthostatic hypotension with episodes of syncope and near syncope, chronic labile blood pressure on midodrine and fludrocortisone daily as well as trigeminal neuralgia on carbamazepine who was brought into the ER today by her daughter for evaluation after she had several syncopal episodes that were witnessed. Patient's daughter and caregiver states that she had the first episode while sitting on the commode.  Patient states that she had strained to have a bowel movement and subsequently felt dizzy and lightheaded.  Per her daughter she had a transient loss of consciousness and she took her back to her room and put her in bed. Patient had 2 more episodes while ambulatory and each time she felt dizzy and lightheaded and went limp.  Her daughter was concerned about the frequency of these episodes and states that she usually has 1 but never this many in 1 day. Patient has been on antibiotic therapy for UTI but denies having any fever, no chills, no nausea, no vomiting, no abdominal pain, no leg swelling, no headache, no cough, no palpitations, no diaphoresis, no blurred vision, no focal deficit. She received 2 L of IV fluids in the ER and will be referred to observation status for further evaluation.  Review of Systems: As mentioned in the history of present illness. All other systems reviewed and are negative. Past Medical History:  Diagnosis Date   Hypertension    Paroxysmal A-fib [facility])    Past Surgical History:  Procedure Laterality Date   APPENDECTOMY     KNEE SURGERY     Social  History:  reports that she has quit smoking. She has never used smokeless tobacco. She reports that she does not currently use alcohol. She reports that she does not currently use drugs.  Allergies  Allergen Reactions   Pneumovax [Pneumococcal Polysaccharide Vaccine]     Other reaction(s): Severe arm swelling   Pneumococcal Vaccine     Other reaction(s): Unknown    History reviewed. No pertinent family history.  Prior to Admission medications   Medication Sig Start Date End Date Taking? Authorizing Provider  atorvastatin (LIPITOR) 10 MG tablet Take 10 mg by mouth daily. 12/11/21   [provider]  Calcium Carb-Cholecalciferol (CALCIUM 600/VITAMIN D PO) Take 1 tablet by mouth daily.    [provider]  carbamazepine (CARBATROL) 200 MG 12 hr capsule Take 200 mg by mouth 3 (three) times daily. 12/25/20   [provider]  cefdinir (OMNICEF) 300 MG capsule Take 300 mg by mouth 2 (two) times daily. 01/02/22   [provider]  digoxin (LANOXIN) 0.125 MG tablet Take 125 mcg by mouth daily. 12/18/21   [provider]  ELIQUIS 5 MG TABS tablet Take 5 mg by mouth 2 (two) times daily. 12/18/21   [provider]  ferrous sulfate 324 MG TBEC Take 324 mg by mouth 3 (three) times a week.    [provider]  fludrocortisone (FLORINEF) 0.1 MG tablet TAKE 5 TABLETS BY MOUTH ONCE DAILY 12/25/20   [provider]  gabapentin (NEURONTIN) 300 MG capsule Take 300 mg by mouth 2 (two) times daily. 12/12/21  [provider]  midodrine (PROAMATINE) 2.5 MG tablet Take 1 tablet (2.5 mg total) by mouth 3 (three) times daily. Can take an additional dose daily as needed 01/19/21   Wouk, Ailene Rud, MD  omeprazole (PRILOSEC) 20 MG capsule Take 20 mg by mouth daily.    [provider]  phenytoin (DILANTIN) 100 MG ER capsule Take by mouth. 11/22/21   [provider]  polyethylene glycol (MIRALAX / GLYCOLAX) 17 g packet Take 17 g by mouth  daily.    [provider]  pravastatin (PRAVACHOL) 40 MG tablet Take 40 mg by mouth at bedtime. 12/25/20   [provider]    Physical Exam: Vitals:   01/08/22 1330 01/08/22 1400 01/08/22 1430 01/08/22 1510  BP: 122/74 94/68 116/71 (!) 125/100  Pulse: 97 97 80 (!) 123  Resp: 14 (!) 21 19 13   Temp:      TempSrc:      SpO2: 99% 99% 98% 97%  Weight:      Height:       Physical Exam Constitutional:      Appearance: Normal appearance. She is normal weight.  HENT:     Head: Normocephalic and atraumatic.     Nose: Nose normal.     Mouth/Throat:     Mouth: Mucous membranes are moist.  Eyes:     Pupils: Pupils are equal, round, and reactive to light.  Cardiovascular:     Rate and Rhythm: Tachycardia present. Rhythm irregular.  Pulmonary:     Effort: Pulmonary effort is normal.     Breath sounds: Normal breath sounds.  Abdominal:     General: Abdomen is flat. Bowel sounds are normal.     Palpations: Abdomen is soft.  Musculoskeletal:        General: Normal range of motion.     Cervical back: Normal range of motion and neck supple.  Skin:    General: Skin is warm and dry.  Neurological:     General: No focal deficit present.     Mental Status: She is alert and oriented to person, place, and time.  Psychiatric:        Mood and Affect: Mood normal.        Behavior: Behavior normal.    Data Reviewed: Data Reviewed: Relevant notes from primary care and specialist visits, past discharge summaries as available in EHR, including Care Everywhere. Prior diagnostic testing as pertinent to current admission diagnoses Updated medications and problem lists for reconciliation ED course, including vitals, labs, imaging, treatment and response to treatment Triage notes, nursing and pharmacy notes and ED provider's notes Notable results as noted in HPI Labs reviewed troponin 25 >> 47, digoxin level 0.7, magnesium 2.0, BUN 14, creatinine 1.28, lactic acid 4.6, white count  7.6 CT scan of the head without contrast showed no acute findings Chest x-ray reviewed by me shows cardiomegaly with no evidence of pulmonary infiltrates or pleural effusion. Twelve-lead EKG reviewed by me shows A-fib with a rapid ventricular rate.  ST depression in the inferior lateral leads There are no new results to review at this time.  Assessment and Plan: * Syncope and collapse- (present on admission) Appears to be multifactorial and related to orthostatic blood pressure changes as well as vasovagal syncope Patient received 2 L of IV fluid Continue Florinef and midodrine Place patient on cardiac monitor to rule out arrhythmias We will request cardiology consult Patient had a 2D echocardiogram which showed an LVEF of 60 to 65% with no evidence  of aortic stenosis   Paroxysmal atrial fibrillation (HCC)- (present on admission) Patient has a history of paroxysmal A-fib and is currently in rapid ventricular rate Digoxin level is subtherapeutic We will load patient with digoxin for rate control Continue Eliquis as primary prophylaxis for acute stroke  Chronic orthostatic hypotension- (present on admission) Patient has a history of orthostatic hypotension Continue midodrine and Florinef Patient also needs to wear TED hose daily  UTI (urinary tract infection)- (present on admission) Patient with a history of recent UTI currently on antibiotic therapy, cefdinir 300 mg twice daily. Patient completes antibiotic therapy on 01/12/22  Stage 3 chronic kidney disease ([place])- (present on admission) Renal function is stable Monitor closely  History of trigeminal neuralgia Stable Continue carbamazepine and Neurontin       Advance Care Planning:   Code Status: DNR .DNR  Consults: Cardiology  Family Communication: Greater than 50% of time was spent discussing patient's condition and plan of care with patient and her daughter who is her healthcare power of attorney at the bedside.  All  questions and concerns have been addressed.  They verbalized understanding and agree with the plan.  CODE STATUS was discussed and she wishes to be a DNR.  Severity of Illness: The appropriate patient status for this patient is OBSERVATION. Observation status is judged to be reasonable and necessary in order to provide the required intensity of service to ensure the patient's safety. The patient's presenting symptoms, physical exam findings, and initial radiographic and laboratory data in the context of their medical condition is felt to place them at decreased risk for further clinical deterioration. Furthermore, it is anticipated that the patient will be medically stable for discharge from the hospital within 2 midnights of admission.   Author: Collier Bullock, MD 01/08/2022 4:55 PM  For on call review [email or web address].

## 2022-01-08 NOTE — ED Notes (Signed)
Informed RN bed assigned 

## 2022-01-08 NOTE — Assessment & Plan Note (Signed)
Patient has a history of orthostatic hypotension Continue midodrine and Florinef Patient also needs to wear TED hose daily

## 2022-01-08 NOTE — ED Provider Notes (Signed)
[facility] Provider Note    Event Date/Time   First MD Initiated Contact with Patient 01/08/22 1238     (approximate)   History   Loss of Consciousness   HPI  Tricia Edwards is a 88 y.o. female here with syncope.  The patient had 3 episodes of syncope today.  She has history of A-fib, paroxysmal, but has reportedly had episodes of syncope when she pops into this.  She believes she went into atrial fibrillation today.  She has recently been treated for UTI and has been somewhat more weak than usual.  She has been eating and drinking normally however.  Earlier today, she began to feel lightheaded and syncopized, she then had 2 additional episodes shortly thereafter.  Denies any chest pain with these.  She currently feels back to baseline although slightly overall tired.  No abdominal pain.  No dysuria ongoing.  No flank pain.  No rashes.  No leg swelling.     Physical Exam   Triage Vital Signs: ED Triage Vitals  Enc Vitals Group     BP 01/08/22 1245 134/85     Pulse Rate 01/08/22 1245 (!) 117     Resp 01/08/22 1245 18     Temp 01/08/22 1245 98.6 F (37 C)     Temp Source 01/08/22 1245 Oral     SpO2 01/08/22 1245 98 %     Weight 01/08/22 1254 170 lb (77.1 kg)     Height 01/08/22 1254 5\' 7"  (1.702 m)     Head Circumference --      Peak Flow --      Pain Score 01/08/22 1253 0     Pain Loc --      Pain Edu? --      Excl. in GC? --     Most recent vital signs: Vitals:   01/08/22 1430 01/08/22 1510  BP: 116/71 (!) 125/100  Pulse: 80 (!) 123  Resp: 19 13  Temp:    SpO2: 98% 97%     General: Awake, no distress.  CV:  Good peripheral perfusion.  Irregular rhythm, slightly tachycardic. Resp:  Normal effort.  No rales or rhonchi. Abd:  No distention.  No tenderness. Other:  No lower extremity edema.   ED Results / Procedures / Treatments   Labs (all labs ordered are listed, but only abnormal results are displayed) Labs Reviewed   COMPREHENSIVE METABOLIC PANEL - Abnormal; Notable for the following components:      Result Value   Glucose, Bld 119 (*)    Creatinine, Ser 1.28 (*)    Calcium 8.4 (*)    Alkaline Phosphatase 157 (*)    GFR, Estimated 40 (*)    All other components within normal limits  LACTIC ACID, PLASMA - Abnormal; Notable for the following components:   Lactic Acid, Venous 4.6 (*)    All other components within normal limits  CBC WITH DIFFERENTIAL/PLATELET - Abnormal; Notable for the following components:   RBC 3.45 (*)    Hemoglobin 10.1 (*)    HCT 33.5 (*)    All other components within normal limits  URINALYSIS, ROUTINE W REFLEX MICROSCOPIC - Abnormal; Notable for the following components:   Color, Urine STRAW (*)    APPearance HAZY (*)    Specific Gravity, Urine 1.003 (*)    All other components within normal limits  DIGOXIN LEVEL - Abnormal; Notable for the following components:   Digoxin Level 0.7 (*)    All other  components within normal limits  TROPONIN I (HIGH SENSITIVITY) - Abnormal; Notable for the following components:   Troponin I (High Sensitivity) 25 (*)    All other components within normal limits  TROPONIN I (HIGH SENSITIVITY) - Abnormal; Notable for the following components:   Troponin I (High Sensitivity) 47 (*)    All other components within normal limits  RESP PANEL BY RT-PCR (FLU A&B, COVID) ARPGX2  CULTURE, BLOOD (ROUTINE X 2)  CULTURE, BLOOD (ROUTINE X 2)  MAGNESIUM  LACTIC ACID, PLASMA     EKG Atrial fibrillation with rapid ventricular response, ventricular rate 131.  Nonspecific ST changes, likely rate related.   RADIOLOGY CT head: No acute intracranial abnormality Chest x-ray: Pending   I also independently reviewed and agree wit radiologist interpretations.   PROCEDURES:  Critical Care performed: No  .1-3 Lead EKG Interpretation Performed by: Shaune Pollack, MD Authorized by: Shaune Pollack, MD     Interpretation: normal     ECG rate:   90-120   ECG rate assessment: tachycardic     Rhythm: atrial fibrillation     Ectopy: none     Conduction: normal   Comments:     Indication: Afib, syncope    MEDICATIONS ORDERED IN ED: Medications  sodium chloride 0.9 % bolus 1,000 mL (0 mLs Intravenous Stopped 01/08/22 1553)  cefTRIAXone (ROCEPHIN) 2 g in sodium chloride 0.9 % 100 mL IVPB (0 g Intravenous Stopped 01/08/22 1545)  sodium chloride 0.9 % bolus 1,000 mL (1,000 mLs Intravenous New Bag/Given 01/08/22 1611)     IMPRESSION / MDM / ASSESSMENT AND PLAN / ED COURSE  I reviewed the triage vital signs and the nursing notes.                               The patient is on the cardiac monitor to evaluate for evidence of arrhythmia and/or significant heart rate changes.  MDM:  88 year old female with history of paroxysmal atrial fibrillation here with syncope x3.  EKG shows atrial fibrillation with likely rate related demand changes.  Her rate has improved slightly with fluids in the ED.  Otherwise, she does have recent UTI, though does not appear septic.  She was given a dose of Rocephin here and urinalysis shows likely resolution of this.  CBC is without leukocytosis or significant change in her baseline anemia.  Lactic acid 4.6, which I suspect is due to hypoperfusion in the setting of her A-fib RVR.  Troponin 25.  She was given cautious fluids.  Digoxin level subtherapeutic at 0.7, likely will need a load of this for better rate control.  Hesitant to give any beta-blockers or calcium channel blockers in the setting of her history of very labile blood pressures with hypotension in the setting of these medications.  CT head is negative.  We will plan to admit for hydration, digoxin load, further management.  Patient updated and is in agreement with this plan.  Daughter also updated.   MEDICATIONS GIVEN IN ED: Medications  sodium chloride 0.9 % bolus 1,000 mL (0 mLs Intravenous Stopped 01/08/22 1553)  cefTRIAXone (ROCEPHIN) 2 g in  sodium chloride 0.9 % 100 mL IVPB (0 g Intravenous Stopped 01/08/22 1545)  sodium chloride 0.9 % bolus 1,000 mL (1,000 mLs Intravenous New Bag/Given 01/08/22 1611)     Consults:  Hospitalist consult   EMR reviewed  Previous admission notes from March 2022, ED notes     FINAL CLINICAL IMPRESSION(S) /  ED DIAGNOSES   Final diagnoses:  Syncope and collapse  Paroxysmal atrial fibrillation (HCC)     Rx / DC Orders   ED Discharge Orders     None        Note:  This document was prepared using Dragon voice recognition software and may include unintentional dictation errors.   Shaune Pollack, MD 01/08/22 [phone]

## 2022-01-08 NOTE — Consult Note (Signed)
Cardiology Consultation:   Patient ID: Tricia Edwards MRN: [id number]; DOB: [birth year]  Admit date: 01/08/2022 Date of Consult: 01/08/2022  PCP:  System, Provider Not In   [facility] Providers Cardiologist:  Esmond Plants, MD PhD   Patient Profile:   Tricia Edwards is a 88 y.o. female with a hx of persistent atrial fibrillation status post cardioversion in 08/2021 in [place], orthostatic hypotension with recurrent syncope, chronic HFpEF, hypertension, and hyperlipidemia, who is being seen 01/08/2022 for the evaluation of atrial fibrillation and syncope at the request of Dr. Francine Graven.  History of Present Illness:   Tricia Edwards reports that she was in her usual state of health until this afternoon when she tried to stand up after using the bathroom and suddenly felt lightheaded and weak in her legs.  Her daughter was with her and was able to help her to the ground, where Tricia Edwards believes that she passed out for less than a minute.  When she came to, she felt back to her normal self and attempted to get up and walk again.  However, she became lightheaded and transiently passed out two subsequent times.  Her daughter brought her to the emergency department, where she was noted to be in atrial fibrillation with rapid ventricular response.  She spontaneously converted back to sinus rhythm prior to reaching the floor.  At this time, Tricia Edwards reports that she feels well.  She has not had any chest pain, shortness of breath, palpitations, or edema.  She notes that prior to her cardioversion last year, she was fairly fatigued, though she has not felt any of this recently.  Her syncopal episodes today are similar to what she has experienced in the past, though she has not had 3 successive episodes like today in quite some time.  She remains compliant with her medications including apixaban.  She splits her time between [place] and [place] and is planning to return to [place]  at the Tricia Edwards of this month.   Past Medical History:  Diagnosis Date   Hypertension    Paroxysmal A-fib [facility])     Past Surgical History:  Procedure Laterality Date   APPENDECTOMY     KNEE SURGERY       Home Medications:  Prior to Admission medications   Medication Sig Start Date Climmie Buelow Date Taking? Authorizing Provider  atorvastatin (LIPITOR) 10 MG tablet Take 10 mg by mouth daily. 12/11/21  Yes [provider]  Calcium Carb-Cholecalciferol (CALCIUM 600/VITAMIN D PO) Take 1 tablet by mouth daily.   Yes [provider]  cefdinir (OMNICEF) 300 MG capsule Take 300 mg by mouth 2 (two) times daily. 01/02/22  Yes [provider]  digoxin (LANOXIN) 0.125 MG tablet Take 125 mcg by mouth daily. 12/18/21  Yes [provider]  ELIQUIS 5 MG TABS tablet Take 5 mg by mouth 2 (two) times daily. 12/18/21  Yes [provider]  ferrous sulfate 324 MG TBEC Take 324 mg by mouth 3 (three) times a week.   Yes [provider]  fludrocortisone (FLORINEF) 0.1 MG tablet TAKE 5 TABLETS BY MOUTH ONCE DAILY 12/25/20  Yes [provider]  gabapentin (NEURONTIN) 300 MG capsule Take 300 mg by mouth 2 (two) times daily. 12/12/21  Yes [provider]  midodrine (PROAMATINE) 2.5 MG tablet Take 1 tablet (2.5 mg total) by mouth 3 (three) times daily. Can take an additional dose daily as needed 01/19/21  Yes Edwards, Ailene Rud, MD  omeprazole (PRILOSEC) 20 MG capsule Take  20 mg by mouth daily.   Yes [provider]  phenytoin (DILANTIN) 100 MG ER capsule Take 200 mg by mouth 2 (two) times daily. 200mg  am, 100mg  pm 11/22/21  Yes [provider]  pravastatin (PRAVACHOL) 40 MG tablet Take 40 mg by mouth at bedtime. 12/25/20  Yes [provider]  senna (SENOKOT) 8.6 MG TABS tablet Take 1 tablet by mouth daily as needed for mild constipation.   Yes [provider]  carbamazepine (CARBATROL) 200 MG 12 hr capsule Take 200 mg by mouth 3  (three) times daily. Patient not taking: Reported on 01/08/2022 12/25/20   [provider]  polyethylene glycol (MIRALAX / GLYCOLAX) 17 g packet Take 17 g by mouth daily. Patient not taking: Reported on 01/08/2022    [provider]    Inpatient Medications: Scheduled Meds:  apixaban  5 mg Oral BID   atorvastatin  10 mg Oral Daily   [START ON 01/09/2022] calcium-vitamin D   Oral Daily   [START ON 01/09/2022] cefdinir  300 mg Oral BID   [START ON 01/09/2022] digoxin  0.125 mg Oral Daily   digoxin  0.125 mg Oral Once   digoxin  0.5 mg Oral Once   [START ON 01/09/2022] ferrous sulfate  324 mg Oral Once per day on Mon Wed Fri   [START ON 01/09/2022] fludrocortisone  0.5 mg Oral Daily   gabapentin  300 mg Oral BID   midodrine  2.5 mg Oral TID   pantoprazole  40 mg Oral Daily   [START ON 01/09/2022] phenytoin  200 mg Oral Daily   And   phenytoin  100 mg Oral QHS   sodium chloride flush  3 mL Intravenous Q12H   Continuous Infusions:  PRN Meds: acetaminophen **OR** acetaminophen, ondansetron **OR** ondansetron (ZOFRAN) IV, senna  Allergies:    Allergies  Allergen Reactions   Pneumovax [Pneumococcal Polysaccharide Vaccine]     Other reaction(s): Severe arm swelling   Pneumococcal Vaccine     Other reaction(s): Unknown    Social History:   Social History   Tobacco Use   Smoking status: Former   Smokeless tobacco: Never  Substance Use Topics   Alcohol use: Not Currently   Drug use: Not Currently      Family History:   History reviewed. No pertinent family history.  Sister has a history of breast cancer.  ROS:  Please see the history of present illness. All other ROS reviewed and negative.     Physical Exam/Data:   Vitals:   01/08/22 1430 01/08/22 1510 01/08/22 1804 01/08/22 1936  BP: 116/71 (!) 125/100 (!) 165/86 (!) 150/74  Pulse: 80 (!) 123 85 72  Resp: 19 13 20 16   Temp:   98.3 F (36.8 C) 97.8 F (36.6 C)  TempSrc:      SpO2: 98% 97% 100% 99%   Weight:      Height:        Intake/Output Summary (Last 24 hours) at 01/08/2022 2037 Last data filed at 01/08/2022 1553 Gross per 24 hour  Intake 1100.27 ml  Output --  Net 1100.27 ml   Last 3 Weights 01/08/2022 08/14/2021 08/14/2021  Weight (lbs) 170 lb 185 lb 185 lb  Weight (kg) 77.111 kg 83.915 kg 83.915 kg     Body mass index is 26.63 kg/m.  General:  Well nourished, well developed, in no acute distress HEENT: normal Neck: no JVD Vascular: No carotid bruits; Distal pulses 2+ bilaterally Cardiac:  normal S1, S2; RRR; no murmurs,  rubs, or gallops Lungs:  clear to auscultation bilaterally, no wheezing, rhonchi or rales  Abd: soft, nontender, no hepatomegaly  Ext: no edema Musculoskeletal:  No deformities, BUE and BLE strength normal and equal Skin: warm and dry  Neuro:  CNs 2-12 intact, no focal abnormalities noted Psych:  Normal affect   EKG:  The EKG was personally reviewed and demonstrates: Normal sinus rhythm with diffuse ST depressions. Telemetry:  Telemetry was personally reviewed and demonstrates: Normal sinus rhythm.  ED telemetry not available for review.  Relevant CV Studies: TTE (01/18/2021):  1. Left ventricular ejection fraction, by estimation, is 60 to 65%. The  left ventricle has normal function. The left ventricle has no regional  wall motion abnormalities. There is moderate left ventricular hypertrophy.  Left ventricular diastolic  parameters are consistent with Grade I diastolic dysfunction (impaired  relaxation).   2. Right ventricular systolic function is normal. The right ventricular  size is normal.   3. Left atrial size was mildly dilated.   Laboratory Data:  High Sensitivity Troponin:   Recent Labs  Lab 01/08/22 1230 01/08/22 1401  TROPONINIHS 25* 47*     Chemistry Recent Labs  Lab 01/08/22 1230 01/08/22 1401  NA 139  --   K 3.7  --   CL 104  --   CO2 24  --   GLUCOSE 119*  --   BUN 14  --   CREATININE 1.28*  --   CALCIUM 8.4*  --    MG  --  2.0  GFRNONAA 40*  --   ANIONGAP 11  --     Recent Labs  Lab 01/08/22 1230  PROT 7.4  ALBUMIN 3.5  AST 39  ALT 23  ALKPHOS 157*  BILITOT 0.8   Lipids No results for input(s): CHOL, TRIG, HDL, LABVLDL, LDLCALC, CHOLHDL in the last 168 hours.  Hematology Recent Labs  Lab 01/08/22 1230  WBC 7.6  RBC 3.45*  HGB 10.1*  HCT 33.5*  MCV 97.1  MCH 29.3  MCHC 30.1  RDW 12.7  PLT 323   Thyroid No results for input(s): TSH, FREET4 in the last 168 hours.  BNPNo results for input(s): BNP, PROBNP in the last 168 hours.  DDimer No results for input(s): DDIMER in the last 168 hours.   Radiology/Studies:  CT HEAD WO CONTRAST (5MM)  Result Date: 01/08/2022 CLINICAL DATA:  Syncope/presyncope, cerebrovascular cause suspected EXAM: CT HEAD WITHOUT CONTRAST TECHNIQUE: Contiguous axial images were obtained from the base of the skull through the vertex without intravenous contrast. RADIATION DOSE REDUCTION: This exam was performed according to the departmental dose-optimization program which includes automated exposure control, adjustment of the mA and/or kV according to patient size and/or use of iterative reconstruction technique. COMPARISON:  None. BRAIN: BRAIN Cerebral ventricle sizes are concordant with the degree of cerebral volume loss. Patchy and confluent areas of decreased attenuation are noted throughout the deep and periventricular white matter of the cerebral hemispheres bilaterally, compatible with chronic microvascular ischemic disease. No evidence of large-territorial acute infarction. No parenchymal hemorrhage. No mass lesion. No extra-axial collection. No mass effect or midline shift. No hydrocephalus. Basilar cisterns are patent. Vascular: No hyperdense vessel. Atherosclerotic calcifications are present within the cavernous internal carotid arteries. Skull: No acute fracture or focal lesion. Sinuses/Orbits: Partial effusion of the right mastoid air cells. No definite fluid  within the middle ear. Otherwise paranasal sinuses and left mastoid air cells are clear. Right lens replacement. Otherwise the orbits are unremarkable. Other: None. IMPRESSION: 1. No  acute intracranial abnormality. 2. Partial effusion of the right mastoid air cells. Electronically Signed   By: Iven Finn M.D.   On: 01/08/2022 15:29   DG Chest Portable 1 View  Result Date: 01/08/2022 CLINICAL DATA:  Syncope EXAM: PORTABLE CHEST 1 VIEW COMPARISON:  08/14/2021 FINDINGS: Transverse diameter of heart is increased. There are no signs of pulmonary edema or new focal infiltrates. Costophrenic angles are clear. There is no pneumothorax. Degenerative changes are noted in both shoulders, more so on the right side. IMPRESSION: Cardiomegaly. There are no signs of pulmonary edema or new focal infiltrates. Electronically Signed   By: Elmer Picker M.D.   On: 01/08/2022 16:29     Assessment and Plan:   Syncope: Syncopal episode most likely vasovagal in nature, though atrial fibrillation with rapid ventricular response noted upon presentation in the ED may have contributed.  High ventricular rates could have led to more precipitous drops in blood pressure.  At this time, Tricia Edwards feels back to her baseline and is in sinus rhythm.  I think it is reasonable to continue her home regimen of midodrine and fludrocortisone.  Atrial fibrillation with rapid ventricular response: Tricia Edwards is a history of persistent atrial fibrillation and underwent cardioversion in the fall.  At the time of follow-up, it was recommended that amiodarone be considered if she were to have recurrent atrial fibrillation.  I think it is reasonable to consider adding this medication, though I think it would be worthwhile to discuss this with her daughter, who helps care for her mother.  It would also be worthwhile to consult with pharmacy regarding digoxin and phenytoin dosing in the setting of amiodarone.  I will hold further doses of  digoxin, given that the patient was given a load in the ED today, and will await repeat digoxin level tomorrow.  Given the patient's age and potential addition of amiodarone, it may be best to avoid digoxin altogether.  Demand ischemia: Mild troponin elevation noted as well as widespread ST segment changes on presenting EKG in the setting of atrial fibrillation with rapid ventricular response.  Though Tricia Edwards was asymptomatic, I think it is worthwhile to consider underlying ischemic heart disease as a potential cause for her syncope.  I do not see that ischemia evaluation has been performed recently.  Tricia Edwards may benefit from myocardial perfusion stress test.  I will make her n.p.o. after midnight in case this can be performed tomorrow.  However, will need to be discussed further with the patient and her daughter, who was no longer at her bedside this evening, before proceeding.  For questions or updates, please contact [place] Please consult [email or web address] for contact info under [facility].  Signed, Nelva Bush, MD  01/08/2022 8:37 PM

## 2022-01-08 NOTE — ED Notes (Signed)
Pt pulled out of car and assisted into wheelchair. Pt appears little lethargic and pale. Pt assisted into wheelchair. Pt taken back for EKG and revealed Afib RVR. EKG taken to MD Isaacs to review.  Charge RN called for room for pt. Pt taken to ED 15 and RN Adam at bedside and informed that pt is not triaged but EKG completed and rainbow sent to lab.

## 2022-01-08 NOTE — Assessment & Plan Note (Signed)
Patient with a history of recent UTI currently on antibiotic therapy, cefdinir 300 mg twice daily. Patient completes antibiotic therapy on 01/12/22

## 2022-01-08 NOTE — ED Triage Notes (Signed)
Pt with hx of a-fib had 3 syncopal episodes today. Pt currently in a-fib with RVR with rate max of 130. Pt A/Ox4 at time of arrival.

## 2022-01-08 NOTE — Assessment & Plan Note (Signed)
Appears to be multifactorial and related to orthostatic blood pressure changes as well as vasovagal syncope Patient received 2 L of IV fluid Continue Florinef and midodrine Place patient on cardiac monitor to rule out arrhythmias We will request cardiology consult Patient had a 2D echocardiogram which showed an LVEF of 60 to 65% with no evidence of aortic stenosis

## 2022-01-08 NOTE — ED Provider Triage Note (Signed)
Emergency Medicine Provider Triage Evaluation Note  Tricia Edwards , a 88 y.o. female  was evaluated in triage.  Pt complains of onset of A-fib today.  Daughter states her heart rate was at 63 this morning.  Suddenly after a bowel movement has gone into A-fib.  Also being treated for UTI.  States mother has been weak..  Review of Systems  Positive: A-fib Negative:   Physical Exam  There were no vitals taken for this visit. Gen:   Awake, no distress   Resp:  Normal effort  MSK:   Moves extremities without difficulty  Other:  Heart rate is irregular  Medical Decision Making  Medically screening exam initiated at 12:21 PM.  Appropriate orders placed.  Lashala Laser was informed that the remainder of the evaluation will be completed by another provider, this initial triage assessment does not replace that evaluation, and the importance of remaining in the ED until their evaluation is complete.     Faythe Ghee, PA-C 01/08/22 1224

## 2022-01-08 NOTE — Assessment & Plan Note (Signed)
Stable Continue carbamazepine and Neurontin

## 2022-01-08 NOTE — Assessment & Plan Note (Signed)
Patient has a history of paroxysmal A-fib and is currently in rapid ventricular rate Digoxin level is subtherapeutic We will load patient with digoxin for rate control Continue Eliquis as primary prophylaxis for acute stroke

## 2022-01-08 NOTE — Assessment & Plan Note (Signed)
Renal function is stable. Monitor closely 

## 2022-01-09 DIAGNOSIS — I480 Paroxysmal atrial fibrillation: Secondary | ICD-10-CM | POA: Diagnosis not present

## 2022-01-09 DIAGNOSIS — R778 Other specified abnormalities of plasma proteins: Secondary | ICD-10-CM | POA: Diagnosis not present

## 2022-01-09 DIAGNOSIS — R55 Syncope and collapse: Secondary | ICD-10-CM | POA: Diagnosis not present

## 2022-01-09 DIAGNOSIS — R7989 Other specified abnormal findings of blood chemistry: Secondary | ICD-10-CM

## 2022-01-09 LAB — CBC
HCT: 26.6 % — ABNORMAL LOW (ref 36.0–46.0)
Hemoglobin: 8.2 g/dL — ABNORMAL LOW (ref 12.0–15.0)
MCH: 29.4 pg (ref 26.0–34.0)
MCHC: 30.8 g/dL (ref 30.0–36.0)
MCV: 95.3 fL (ref 80.0–100.0)
Platelets: 291 10*3/uL (ref 150–400)
RBC: 2.79 MIL/uL — ABNORMAL LOW (ref 3.87–5.11)
RDW: 12.6 % (ref 11.5–15.5)
WBC: 5.9 10*3/uL (ref 4.0–10.5)
nRBC: 0 % (ref 0.0–0.2)

## 2022-01-09 LAB — GLUCOSE, CAPILLARY: Glucose-Capillary: 108 mg/dL — ABNORMAL HIGH (ref 70–99)

## 2022-01-09 LAB — BASIC METABOLIC PANEL
Anion gap: 7 (ref 5–15)
BUN: 12 mg/dL (ref 8–23)
CO2: 27 mmol/L (ref 22–32)
Calcium: 7.8 mg/dL — ABNORMAL LOW (ref 8.9–10.3)
Chloride: 109 mmol/L (ref 98–111)
Creatinine, Ser: 0.85 mg/dL (ref 0.44–1.00)
GFR, Estimated: >60 mL/min (ref >60)
Glucose, Bld: 106 mg/dL — ABNORMAL HIGH (ref 70–99)
Potassium: 3.4 mmol/L — ABNORMAL LOW (ref 3.5–5.1)
Sodium: 143 mmol/L (ref 135–145)

## 2022-01-09 LAB — DIGOXIN LEVEL: Digoxin Level: 0.8 ng/mL (ref 0.8–2.0)

## 2022-01-09 MED ORDER — FLUDROCORTISONE ACETATE 0.1 MG PO TABS: 0.3000 mg | ORAL_TABLET | Freq: Every day | ORAL | Status: DC

## 2022-01-09 MED ORDER — MIDODRINE HCL 5 MG PO TABS
7.5000 mg | ORAL_TABLET | Freq: Once | ORAL | Status: AC
  Administered 2022-01-09: 7.5 mg via ORAL
  Filled 2022-01-09: qty 2

## 2022-01-09 MED ORDER — MIDODRINE HCL 5 MG PO TABS: 10.0000 mg | ORAL_TABLET | Freq: Three times a day (TID) | ORAL | Status: DC

## 2022-01-09 NOTE — Discharge Summary (Signed)
Physician Discharge Summary  Jaskiran Nauer [id number] DOB: 06-03-1930 DOA: 01/08/2022  PCP: System, Provider Not In  Admit date: 01/08/2022 Discharge date: 01/09/2022  Admitted From: Home Disposition: Home with home health  Recommendations for Outpatient Follow-up:  Follow up with PCP in 1-2 weeks   Home Health: Yes Equipment/Devices: None  Discharge Condition: Stable CODE STATUS: Full Diet recommendation: Heart healthy  Brief/Interim Summary: 88 y.o. female with medical history significant for paroxysmal A-fib on anticoagulation, history of orthostatic hypotension with episodes of syncope and near syncope, chronic labile blood pressure on midodrine and fludrocortisone daily as well as trigeminal neuralgia on carbamazepine who was brought into the ER today by her daughter for evaluation after she had several syncopal episodes that were witnessed. Patient's daughter and caregiver states that she had the first episode while sitting on the commode.  Patient states that she had strained to have a bowel movement and subsequently felt dizzy and lightheaded.  Per her daughter she had a transient loss of consciousness and she took her back to her room and put her in bed. Patient had 2 more episodes while ambulatory and each time she felt dizzy and lightheaded and went limp.  Her daughter was concerned about the frequency of these episodes and states that she usually has 1 but never this many in 1 day. Patient has been on antibiotic therapy for UTI but denies having any fever, no chills, no nausea, no vomiting, no abdominal pain, no leg swelling, no headache, no cough, no palpitations, no diaphoresis, no blurred vision, no focal deficit. She received 2 L of IV fluids in the ER and will be referred to observation status for further evaluation.  Patient converted to sinus rhythm during course of hospitalization.  Cardiology consulted.  Recommend to hold digoxin as patient has converted to sinus  rhythm.  Had lengthy discussion with patient's daughter at bedside.  Daughter provides full-time care.  Patient on aggressive doses of midodrine and Florinef for orthostatic hypotension.  Her specialists are mostly located in [place].  She is established with [facility] cardiology in [place].  Patient inquired about establishment of primary care in [place].  TOC engaged for recommendations.  At time of discharge patient is stable.  Back to baseline level of functioning.  Stable for discharge home.  Home health services ordered.    Discharge Diagnoses:  Principal Problem:   Syncope and collapse Active Problems:   Chronic orthostatic hypotension   History of trigeminal neuralgia   Stage 3 chronic kidney disease (HCC)   Paroxysmal atrial fibrillation (HCC)   UTI (urinary tract infection)   Elevated troponin  * Syncope and collapse- (present on admission) Appears to be multifactorial and related to orthostatic blood pressure changes as well as vasovagal syncope.  Received fluid resuscitation in ED.  Blood pressure stable, borderline low.  Seen by physical therapy and Occupational Therapy.  Home health services recommended and ordered.  Per cardiology recommendation we will discontinue digoxin at time of discharge     Paroxysmal atrial fibrillation ([place])- (present on admission) Patient has a history of paroxysmal A-fib and is currently in rapid ventricular rate Spontaneously converted.  Will discontinue digoxin per cardiology recommendations at time of discharge   Chronic orthostatic hypotension- (present on admission) Patient has a history of orthostatic hypotension Continue midodrine and Florinef Patient also needs to wear TED hose daily   UTI (urinary tract infection)- (present on admission) Patient with a history of recent UTI currently on antibiotic therapy, cefdinir 300 mg twice daily. Patient  completes antibiotic therapy on 01/12/22   Stage 3 chronic kidney disease ([place])- (present on  admission) Renal function is stable Monitor closely   History of trigeminal neuralgia Stable Continue carbamazepine and Neurontin  Discharge Instructions  Discharge Instructions     Diet - low sodium heart healthy   Complete by: As directed    Increase activity slowly   Complete by: As directed       Allergies as of 01/09/2022       Reactions   Pneumovax [pneumococcal Polysaccharide Vaccine]    Other reaction(s): Severe arm swelling   Pneumococcal Vaccine    Other reaction(s): Unknown        Medication List     STOP taking these medications    digoxin 0.125 MG tablet Commonly known as: LANOXIN   polyethylene glycol 17 g packet Commonly known as: MIRALAX / GLYCOLAX   pravastatin 40 MG tablet Commonly known as: PRAVACHOL       TAKE these medications    atorvastatin 10 MG tablet Commonly known as: LIPITOR Take 10 mg by mouth daily.   CALCIUM 600/VITAMIN D PO Take 1 tablet by mouth daily.   cefdinir 300 MG capsule Commonly known as: OMNICEF Take 300 mg by mouth 2 (two) times daily.   Eliquis 5 MG Tabs tablet Generic drug: apixaban Take 5 mg by mouth 2 (two) times daily.   ferrous sulfate 324 MG Tbec Take 324 mg by mouth 3 (three) times a week. Take 1 tablet every morning, and 1 tablet in the evening every Wednesday and Saturday   fludrocortisone 0.1 MG tablet Commonly known as: FLORINEF Take 0.1 mg by mouth. Two tablets in the morning and one tablet at noon   gabapentin 300 MG capsule Commonly known as: NEURONTIN Take 300 mg by mouth 2 (two) times daily.   midodrine 2.5 MG tablet Commonly known as: PROAMATINE Take 1 tablet (2.5 mg total) by mouth 3 (three) times daily. Can take an additional dose daily as needed What changed: how much to take   omeprazole 20 MG capsule Commonly known as: PRILOSEC Take 20 mg by mouth daily.   phenytoin 100 MG ER capsule Commonly known as: DILANTIN Take 200 mg by mouth 2 (two) times daily. Take 200 mg  every other morning alternating with 100 mg, and 100 mg capsule daily in the evening   senna 8.6 MG Tabs tablet Commonly known as: SENOKOT Take 1 tablet by mouth daily as needed for mild constipation.        Allergies  Allergen Reactions   Pneumovax [Pneumococcal Polysaccharide Vaccine]     Other reaction(s): Severe arm swelling   Pneumococcal Vaccine     Other reaction(s): Unknown    Consultations: Cardiology-[facility]   Procedures/Studies: CT HEAD WO CONTRAST (5MM)  Result Date: 01/08/2022 CLINICAL DATA:  Syncope/presyncope, cerebrovascular cause suspected EXAM: CT HEAD WITHOUT CONTRAST TECHNIQUE: Contiguous axial images were obtained from the base of the skull through the vertex without intravenous contrast. RADIATION DOSE REDUCTION: This exam was performed according to the departmental dose-optimization program which includes automated exposure control, adjustment of the mA and/or kV according to patient size and/or use of iterative reconstruction technique. COMPARISON:  None. BRAIN: BRAIN Cerebral ventricle sizes are concordant with the degree of cerebral volume loss. Patchy and confluent areas of decreased attenuation are noted throughout the deep and periventricular white matter of the cerebral hemispheres bilaterally, compatible with chronic microvascular ischemic disease. No evidence of large-territorial acute infarction. No parenchymal hemorrhage. No mass lesion. No  extra-axial collection. No mass effect or midline shift. No hydrocephalus. Basilar cisterns are patent. Vascular: No hyperdense vessel. Atherosclerotic calcifications are present within the cavernous internal carotid arteries. Skull: No acute fracture or focal lesion. Sinuses/Orbits: Partial effusion of the right mastoid air cells. No definite fluid within the middle ear. Otherwise paranasal sinuses and left mastoid air cells are clear. Right lens replacement. Otherwise the orbits are unremarkable. Other: None. IMPRESSION:  1. No acute intracranial abnormality. 2. Partial effusion of the right mastoid air cells. Electronically Signed   By: Iven Finn M.D.   On: 01/08/2022 15:29   DG Chest Portable 1 View  Result Date: 01/08/2022 CLINICAL DATA:  Syncope EXAM: PORTABLE CHEST 1 VIEW COMPARISON:  08/14/2021 FINDINGS: Transverse diameter of heart is increased. There are no signs of pulmonary edema or new focal infiltrates. Costophrenic angles are clear. There is no pneumothorax. Degenerative changes are noted in both shoulders, more so on the right side. IMPRESSION: Cardiomegaly. There are no signs of pulmonary edema or new focal infiltrates. Electronically Signed   By: Elmer Picker M.D.   On: 01/08/2022 16:29      Subjective: Seen and examined on day of discharge.  Stable no distress.  Stable for discharge home.  Discharge Exam: Vitals:   01/09/22 0800 01/09/22 1125  BP: (!) 157/64 (!) 92/41  Pulse: 65 72  Resp: 18 19  Temp: 98.1 F (36.7 C) 98.2 F (36.8 C)  SpO2: 97% 98%   Vitals:   01/08/22 2311 01/09/22 0454 01/09/22 0800 01/09/22 1125  BP: (!) 143/70 (!) 141/51 (!) 157/64 (!) 92/41  Pulse: 72 65 65 72  Resp: 19 15 18 19   Temp: 98.7 F (37.1 C) 98.6 F (37 C) 98.1 F (36.7 C) 98.2 F (36.8 C)  TempSrc:      SpO2: 98% 97% 97% 98%  Weight:  75.4 kg    Height:        General: Pt is alert, awake, not in acute distress Cardiovascular: RRR, S1/S2 +, no rubs, no gallops Respiratory: CTA bilaterally, no wheezing, no rhonchi Abdominal: Soft, NT, ND, bowel sounds + Extremities: no edema, no cyanosis    The results of significant diagnostics from this hospitalization (including imaging, microbiology, ancillary and laboratory) are listed below for reference.     Microbiology: Recent Results (from the past 240 hour(s))  Resp Panel by RT-PCR (Flu A&B, Covid) Nasopharyngeal Swab     Status: None   Collection Time: 01/08/22  2:01 PM   Specimen: Nasopharyngeal Swab; Nasopharyngeal(NP)  swabs in vial transport medium  Result Value Ref Range Status   SARS Coronavirus 2 by RT PCR NEGATIVE NEGATIVE Final    Comment: (NOTE) SARS-CoV-2 target nucleic acids are NOT DETECTED.  The SARS-CoV-2 RNA is generally detectable in upper respiratory specimens during the acute phase of infection. The lowest concentration of SARS-CoV-2 viral copies this assay can detect is 138 copies/mL. A negative result does not preclude SARS-Cov-2 infection and should not be used as the sole basis for treatment or other patient management decisions. A negative result may occur with  improper specimen collection/handling, submission of specimen other than nasopharyngeal swab, presence of viral mutation(s) within the areas targeted by this assay, and inadequate number of viral copies(<138 copies/mL). A negative result must be combined with clinical observations, patient history, and epidemiological information. The expected result is Negative.  Fact Sheet for Patients:  EntrepreneurPulse.com.au  Fact Sheet for Healthcare Providers:  IncredibleEmployment.be  This test is no t yet approved or cleared  by the [place] and  has been authorized for detection and/or diagnosis of SARS-CoV-2 by FDA under an Emergency Use Authorization (EUA). This EUA will remain  in effect (meaning this test can be used) for the duration of the COVID-19 declaration under Section 564(b)(1) of the Act, 21 U.S.C.section 360bbb-3(b)(1), unless the authorization is terminated  or revoked sooner.       Influenza A by PCR NEGATIVE NEGATIVE Final   Influenza B by PCR NEGATIVE NEGATIVE Final    Comment: (NOTE) The Xpert Xpress SARS-CoV-2/FLU/RSV plus assay is intended as an aid in the diagnosis of influenza from Nasopharyngeal swab specimens and should not be used as a sole basis for treatment. Nasal washings and aspirates are unacceptable for Xpert Xpress  SARS-CoV-2/FLU/RSV testing.  Fact Sheet for Patients: EntrepreneurPulse.com.au  Fact Sheet for Healthcare Providers: IncredibleEmployment.be  This test is not yet approved or cleared by the [place] FDA and has been authorized for detection and/or diagnosis of SARS-CoV-2 by FDA under an Emergency Use Authorization (EUA). This EUA will remain in effect (meaning this test can be used) for the duration of the COVID-19 declaration under Section 564(b)(1) of the Act, 21 U.S.C. section 360bbb-3(b)(1), unless the authorization is terminated or revoked.  Performed at [facility], [address] [place], [place] [address]   Blood culture (routine x 2)     Status: None (Preliminary result)   Collection Time: 01/08/22  2:01 PM   Specimen: BLOOD  Result Value Ref Range Status   Specimen Description BLOOD LAC  Final   Special Requests BOTTLES DRAWN AEROBIC AND ANAEROBIC BCAV  Final   Culture   Final    NO GROWTH < 24 HOURS Performed at [facility], [address], [place], [place] [address]    Report Status PENDING  Incomplete  Blood culture (routine x 2)     Status: None (Preliminary result)   Collection Time: 01/08/22  2:01 PM   Specimen: BLOOD  Result Value Ref Range Status   Specimen Description BLOOD RAC  Final   Special Requests BOTTLES DRAWN AEROBIC AND ANAEROBIC BCAV  Final   Culture   Final    NO GROWTH < 24 HOURS Performed at [facility], [address] [place], [place] [address]    Report Status PENDING  Incomplete     Labs: BNP (last 3 results) No results for input(s): BNP in the last 8760 hours. Basic Metabolic Panel: Recent Labs  Lab 01/08/22 1230 01/08/22 1401 01/09/22 0509  NA 139  --  143  K 3.7  --  3.4*  CL 104  --  109  CO2 24  --  27  GLUCOSE 119*  --  106*  BUN 14  --  12  CREATININE 1.28*  --  0.85  CALCIUM 8.4*  --  7.8*  MG  --  2.0  --    Liver Function Tests: Recent  Labs  Lab 01/08/22 1230  AST 39  ALT 23  ALKPHOS 157*  BILITOT 0.8  PROT 7.4  ALBUMIN 3.5   No results for input(s): LIPASE, AMYLASE in the last 168 hours. No results for input(s): AMMONIA in the last 168 hours. CBC: Recent Labs  Lab 01/08/22 1230 01/09/22 0509  WBC 7.6 5.9  NEUTROABS 4.6  --   HGB 10.1* 8.2*  HCT 33.5* 26.6*  MCV 97.1 95.3  PLT 323 291   Cardiac Enzymes: No results for input(s): CKTOTAL, CKMB, CKMBINDEX, TROPONINI in the last 168  hours. BNP: Invalid input(s): POCBNP CBG: Recent Labs  Lab 01/09/22 0623  GLUCAP 108*   D-Dimer No results for input(s): DDIMER in the last 72 hours. Hgb A1c No results for input(s): HGBA1C in the last 72 hours. Lipid Profile No results for input(s): CHOL, HDL, LDLCALC, TRIG, CHOLHDL, LDLDIRECT in the last 72 hours. Thyroid function studies No results for input(s): TSH, T4TOTAL, T3FREE, THYROIDAB in the last 72 hours.  Invalid input(s): FREET3 Anemia work up No results for input(s): VITAMINB12, FOLATE, FERRITIN, TIBC, IRON, RETICCTPCT in the last 72 hours. Urinalysis    Component Value Date/Time   COLORURINE STRAW (A) 01/08/2022 1401   APPEARANCEUR HAZY (A) 01/08/2022 1401   LABSPEC 1.003 (L) 01/08/2022 1401   PHURINE 7.0 01/08/2022 1401   GLUCOSEU NEGATIVE 01/08/2022 1401   HGBUR NEGATIVE 01/08/2022 1401   BILIRUBINUR NEGATIVE 01/08/2022 1401   KETONESUR NEGATIVE 01/08/2022 1401   PROTEINUR NEGATIVE 01/08/2022 1401   NITRITE NEGATIVE 01/08/2022 1401   LEUKOCYTESUR NEGATIVE 01/08/2022 1401   Sepsis Labs Invalid input(s): PROCALCITONIN,  WBC,  LACTICIDVEN Microbiology Recent Results (from the past 240 hour(s))  Resp Panel by RT-PCR (Flu A&B, Covid) Nasopharyngeal Swab     Status: None   Collection Time: 01/08/22  2:01 PM   Specimen: Nasopharyngeal Swab; Nasopharyngeal(NP) swabs in vial transport medium  Result Value Ref Range Status   SARS Coronavirus 2 by RT PCR NEGATIVE NEGATIVE Final    Comment:  (NOTE) SARS-CoV-2 target nucleic acids are NOT DETECTED.  The SARS-CoV-2 RNA is generally detectable in upper respiratory specimens during the acute phase of infection. The lowest concentration of SARS-CoV-2 viral copies this assay can detect is 138 copies/mL. A negative result does not preclude SARS-Cov-2 infection and should not be used as the sole basis for treatment or other patient management decisions. A negative result may occur with  improper specimen collection/handling, submission of specimen other than nasopharyngeal swab, presence of viral mutation(s) within the areas targeted by this assay, and inadequate number of viral copies(<138 copies/mL). A negative result must be combined with clinical observations, patient history, and epidemiological information. The expected result is Negative.  Fact Sheet for Patients:  EntrepreneurPulse.com.au  Fact Sheet for Healthcare Providers:  IncredibleEmployment.be  This test is no t yet approved or cleared by the [place] FDA and  has been authorized for detection and/or diagnosis of SARS-CoV-2 by FDA under an Emergency Use Authorization (EUA). This EUA will remain  in effect (meaning this test can be used) for the duration of the COVID-19 declaration under Section 564(b)(1) of the Act, 21 U.S.C.section 360bbb-3(b)(1), unless the authorization is terminated  or revoked sooner.       Influenza A by PCR NEGATIVE NEGATIVE Final   Influenza B by PCR NEGATIVE NEGATIVE Final    Comment: (NOTE) The Xpert Xpress SARS-CoV-2/FLU/RSV plus assay is intended as an aid in the diagnosis of influenza from Nasopharyngeal swab specimens and should not be used as a sole basis for treatment. Nasal washings and aspirates are unacceptable for Xpert Xpress SARS-CoV-2/FLU/RSV testing.  Fact Sheet for Patients: EntrepreneurPulse.com.au  Fact Sheet for Healthcare  Providers: IncredibleEmployment.be  This test is not yet approved or cleared by the [place] FDA and has been authorized for detection and/or diagnosis of SARS-CoV-2 by FDA under an Emergency Use Authorization (EUA). This EUA will remain in effect (meaning this test can be used) for the duration of the COVID-19 declaration under Section 564(b)(1) of the Act, 21 U.S.C. section 360bbb-3(b)(1), unless the authorization is terminated  or revoked.  Performed at [facility], [address] [place], [place] [address]   Blood culture (routine x 2)     Status: None (Preliminary result)   Collection Time: 01/08/22  2:01 PM   Specimen: BLOOD  Result Value Ref Range Status   Specimen Description BLOOD LAC  Final   Special Requests BOTTLES DRAWN AEROBIC AND ANAEROBIC BCAV  Final   Culture   Final    NO GROWTH < 24 HOURS Performed at [facility], [address], [place], [place] [address]    Report Status PENDING  Incomplete  Blood culture (routine x 2)     Status: None (Preliminary result)   Collection Time: 01/08/22  2:01 PM   Specimen: BLOOD  Result Value Ref Range Status   Specimen Description BLOOD RAC  Final   Special Requests BOTTLES DRAWN AEROBIC AND ANAEROBIC BCAV  Final   Culture   Final    NO GROWTH < 24 HOURS Performed at [facility], [address] [place], [place] [address]    Report Status PENDING  Incomplete     Time coordinating discharge: Over 30 minutes  SIGNED:   Sidney Ace, MD  [facility] 01/09/2022, 4:38 PM Pager   If 7PM-7AM, please contact night-coverage

## 2022-01-09 NOTE — TOC Initial Note (Addendum)
Transition of Care [facility]) - Initial/Assessment Note    Patient Details  Name: Tricia Edwards MRN: [id number] Date of Birth: 1930/04/27  [facility] CM/SW Contact:    Gildardo Griffes, LCSW Phone Number: 01/09/2022, 10:47 AM  Clinical Narrative:                  CSW spoke with patient's daughter [name] regarding home health services. She is in agreement with no preference of agency.   She reports patient will be staying with her at time of discharge at : [address] [place] [address]  Is in agreement with [facility] PT and OT, [facility] with [facility] able to service.   Has rollator at home and wheel chair Has shower seat and bars Has potty chair but using purewick in the evening.  Patient to discuss Remote Health regarding PCP needs with [facility] agency.   No other discharge needs identified at this time.   Expected Discharge Plan: Home w Home Health Services Barriers to Discharge: No Barriers Identified   Patient Goals and CMS Choice Patient states their goals for this hospitalization and ongoing recovery are:: to go home CMS Medicare.gov Compare Post Acute Care list provided to:: Patient Represenative (must comment) (daughter) Choice offered to / list presented to : Adult Children  Expected Discharge Plan and Services Expected Discharge Plan: Home w Home Health Services       Living arrangements for the past 2 months: Single Family Home                           HH Arranged: PT, OT HH Agency: [facility] Date HH Agency Contacted: 01/09/22 Time HH Agency Contacted: [phone] Representative spoke with at [facility] Agency: Barbara Cower  Prior Living Arrangements/Services Living arrangements for the past 2 months: Single Family Home Lives with:: Relatives   Do you feel safe going back to the place where you live?: Yes               Activities of Daily Living Home Assistive Devices/Equipment: None ADL Screening (condition at time of admission) Patient's  cognitive ability adequate to safely complete daily activities?: Yes Is the patient deaf or have difficulty hearing?: No Does the patient have difficulty seeing, even when wearing glasses/contacts?: No Does the patient have difficulty concentrating, remembering, or making decisions?: No Patient able to express need for assistance with ADLs?: Yes Does the patient have difficulty dressing or bathing?: No Independently performs ADLs?: Yes (appropriate for developmental age) Does the patient have difficulty walking or climbing stairs?: Yes Weakness of Legs: Both Weakness of Arms/Hands: None  Permission Sought/Granted                  Emotional Assessment         Alcohol / Substance Use: Not Applicable Psych Involvement: No (comment)  Admission diagnosis:  Syncope and collapse [R55] Paroxysmal atrial fibrillation (HCC) [I48.0] Patient Active Problem List   Diagnosis Date Noted   UTI (urinary tract infection) 01/08/2022   Hepatic steatosis 12/04/2021   Chronic idiopathic constipation 08/21/2021   Chronic diastolic congestive heart failure (HCC) 05/25/2021   Paroxysmal atrial fibrillation (HCC) 05/25/2021   Hypokalemia 01/18/2021   Chronic orthostatic hypotension 01/18/2021   History of trigeminal neuralgia 01/18/2021   Dizziness 08/08/2020   Osteoporosis 12/30/2018   Vitamin D deficiency 12/22/2018   Syncope and collapse 03/15/2018   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/09/2018   Unspecified abdominal hernia without obstruction  or gangrene 05/05/2017   Gastro-esophageal reflux disease with esophagitis 03/10/2017   Anemia, unspecified 02/06/2017   Macular degeneration, dry 01/01/2017   Pseudophakia of right eye 01/01/2017   Vitamin B12 deficiency 01/01/2017   Hyponatremia 08/22/2016   Trigeminal neuralgia 03/24/2016   Radiculopathy, cervical region 03/24/2016   Atherosclerotic heart disease of native coronary artery without angina pectoris 11/08/2015   Stage 3 chronic kidney  disease ([place]) 10/29/2013   Spondylosis without myelopathy or radiculopathy, site unspecified 06/28/2013   Nuclear senile cataract 01/07/2013   Obesity 08/09/2010   Benign paroxysmal positional vertigo 10/18/2008   Other dorsalgia 12/04/2005   Hyperlipidemia 09/23/2004   PCP:  System, Provider Not In Pharmacy:   [facility] [address], [place] [address] Phone: [phone] Fax: [phone]     Social Determinants of Health (SDOH) Interventions    Readmission Risk Interventions No flowsheet data found.

## 2022-01-09 NOTE — Evaluation (Signed)
Occupational Therapy Evaluation Patient Details Name: Tricia Edwards MRN: [id number] DOB: 11-Jun-1930 Today's Date: 01/09/2022   History of Present Illness Tricia Edwards is a 88 y.o. female with medical history significant for paroxysmal A-fib on anticoagulation, history of orthostatic hypotension with episodes of syncope and near syncope, chronic labile blood pressure on midodrine and fludrocortisone daily as well as trigeminal neuralgia on carbamazepine who was brought into the ER today by her daughter for evaluation after she had several syncopal episodes that were witnessed.   Clinical Impression   Tricia Edwards was seen for OT evaluation this date. Prior to hospital admission, pt requires assist from family for all mobility and ADLs. Pt lives with daughter in home c 24/7 care. Pt presents to acute OT demonstrating impaired ADL performance and functional mobility 2/2 decreased activity tolerance. Pt currently requires  MIN A don/doff underwear at bed level. MIN A + HHA for ADL t/f - daughter in room completed all mobility. Pt would benefit from skilled OT to address noted impairments and functional limitations (see below for any additional details). Upon hospital discharge, recommend no follow up OT.   Orthostatic Vitals  Lying: BP 107/44 Sitting: BP 92/41, MAP 57, HR 74 Standing - unable to tolerate standing    Recommendations for follow up therapy are one component of a multi-disciplinary discharge planning process, led by the attending physician.  Recommendations may be updated based on patient status, additional functional criteria and insurance authorization.   Follow Up Recommendations  No OT follow up    Assistance Recommended at Discharge Frequent or constant Supervision/Assistance  Patient can return home with the following A little help with walking and/or transfers;A little help with bathing/dressing/bathroom    Functional Status Assessment  Patient has not had a recent  decline in their functional status  Equipment Recommendations  None recommended by OT    Recommendations for Other Services       Precautions / Restrictions Precautions Precautions: Fall Restrictions Weight Bearing Restrictions: No      Mobility Bed Mobility Overal bed mobility: Needs Assistance Bed Mobility: Supine to Sit     Supine to sit: Min assist          Transfers Overall transfer level: Needs assistance Equipment used: 1 person hand held assist Transfers: Sit to/from Stand Sit to Stand: Min assist                  Balance Overall balance assessment: Needs assistance Sitting-balance support: No upper extremity supported, Feet supported Sitting balance-Leahy Scale: Fair     Standing balance support: Single extremity supported, During functional activity Standing balance-Leahy Scale: Fair                             ADL either performed or assessed with clinical judgement   ADL Overall ADL's : Needs assistance/impaired                                       General ADL Comments: MIN A don/doff underwear at bed level. MIN A + HHA for ADL t/f - daughter in room completed all mobility      Pertinent Vitals/Pain Pain Assessment Pain Assessment: No/denies pain     Hand Dominance     Extremity/Trunk Assessment Upper Extremity Assessment Upper Extremity Assessment: Defer to OT evaluation   Lower Extremity Assessment Lower Extremity Assessment: Overall  WFL for tasks assessed   Cervical / Trunk Assessment Cervical / Trunk Assessment: Kyphotic   Communication Communication Communication: No difficulties   Cognition Arousal/Alertness: Awake/alert Behavior During Therapy: WFL for tasks assessed/performed Overall Cognitive Status: Within Functional Limits for tasks assessed                                        Home Living Family/patient expects to be discharged to:: Private residence Living  Arrangements: Children Available Help at Discharge: Family;Available 24 hours/day Type of Home: House Home Access: Stairs to enter [employer] of Steps: 1   Home Layout: One level               Home Equipment: Conservation officer, nature (2 wheels)          Prior Functioning/Environment Prior Level of Function : Needs assist             Mobility Comments: patient's daughter is with her all the time and assists with mobility as needed ADLs Comments: Daughter assists as needed        OT Problem List: Decreased strength;Decreased activity tolerance;Impaired balance (sitting and/or standing);Decreased safety awareness      OT Treatment/Interventions: Self-care/ADL training;Therapeutic exercise;DME and/or AE instruction;Energy conservation;Therapeutic activities;Balance training;Patient/family education    OT Goals(Current goals can be found in the care plan section) Acute Rehab OT Goals Patient Stated Goal: to go home OT Goal Formulation: With patient/family Time For Goal Achievement: 01/23/22 Potential to Achieve Goals: Good  OT Frequency: Min 2X/week    Co-evaluation              AM-PAC OT "6 Clicks" Daily Activity     Outcome Measure Help from another person eating meals?: None Help from another person taking care of personal grooming?: A Little Help from another person toileting, which includes using toliet, bedpan, or urinal?: A Little Help from another person bathing (including washing, rinsing, drying)?: A Little Help from another person to put on and taking off regular upper body clothing?: A Little Help from another person to put on and taking off regular lower body clothing?: A Little 6 Click Score: 19   End of Session    Activity Tolerance: Patient tolerated treatment well Patient left: in chair;with call bell/phone within reach;with family/visitor present  OT Visit Diagnosis: Other abnormalities of gait and mobility (R26.89);Muscle weakness  (generalized) (M62.81)                Time: [id number] OT Time Calculation (min): 43 min Charges:  OT General Charges $OT Visit: 1 Visit OT Evaluation $OT Eval Moderate Complexity: 1 Mod OT Treatments $Self Care/Home Management : 23-37 mins  Tricia Edwards, M.S. OTR/L  01/09/22, 12:35 PM  ascom [phone]

## 2022-01-09 NOTE — Evaluation (Signed)
Physical Therapy Evaluation Patient Details Name: Tricia Edwards MRN: [id number] DOB: [birth year] Today's Date: 01/09/2022  History of Present Illness  Tricia Edwards is a 88 y.o. female with medical history significant for paroxysmal A-fib on anticoagulation, history of orthostatic hypotension with episodes of syncope and near syncope, chronic labile blood pressure on midodrine and fludrocortisone daily as well as trigeminal neuralgia on carbamazepine who was brought into the ER today by her daughter for evaluation after she had several syncopal episodes that were witnessed.  Clinical Impression  Patient received in recliner with daughter and OT at bedside. Patient receives assistance as needed from daughter at baseline. She appears to be close to baseline level of mobility at this time. Planning for discharge home and HHPT. Patient will continue to benefit from skilled PT while here to improve strength and independence.         Recommendations for follow up therapy are one component of a multi-disciplinary discharge planning process, led by the attending physician.  Recommendations may be updated based on patient status, additional functional criteria and insurance authorization.  Follow Up Recommendations Home health PT    Assistance Recommended at Discharge Frequent or constant Supervision/Assistance  Patient can return home with the following  A little help with bathing/dressing/bathroom;A little help with walking and/or transfers;Assistance with cooking/housework;Assist for transportation;Help with stairs or ramp for entrance    Equipment Recommendations None recommended by PT  Recommendations for Other Services       Functional Status Assessment Patient has had a recent decline in their functional status and demonstrates the ability to make significant improvements in function in a reasonable and predictable amount of time.     Precautions / Restrictions Precautions Precautions:  Fall Restrictions Weight Bearing Restrictions: No      Mobility  Bed Mobility               General bed mobility comments: patient received in recliner and remained in recliner    Transfers Overall transfer level: Needs assistance Equipment used: 1 person hand held assist Transfers: Sit to/from Stand                  Ambulation/Gait Ambulation/Gait assistance: Min guard Gait Distance (Feet): 25 Feet Assistive device: 1 person hand held assist Gait Pattern/deviations: Step-through pattern Gait velocity: decr     General Gait Details: patient ambulating at what appears to be baseline  Stairs            Wheelchair Mobility    Modified Rankin (Stroke Patients Only)       Balance Overall balance assessment: Needs assistance, History of Falls Sitting-balance support: Feet supported Sitting balance-Leahy Scale: Good     Standing balance support: Single extremity supported, During functional activity Standing balance-Leahy Scale: Fair Standing balance comment: needs assist at all times                             Pertinent Vitals/Pain Pain Assessment Pain Assessment: No/denies pain    Home Living Family/patient expects to be discharged to:: Private residence Living Arrangements: Children Available Help at Discharge: Family;Available 24 hours/day Type of Home: House Home Access: Stairs to enter   [employer] of Steps: 1   Home Layout: One level Home Equipment: Conservation officer, nature (2 wheels)      Prior Function Prior Level of Function : Needs assist             Mobility Comments: patient's daughter is with  her all the time and assists with mobility as needed ADLs Comments: Daughter assists as needed     Hand Dominance        Extremity/Trunk Assessment   Upper Extremity Assessment Upper Extremity Assessment: Defer to OT evaluation    Lower Extremity Assessment Lower Extremity Assessment: Overall WFL for  tasks assessed    Cervical / Trunk Assessment Cervical / Trunk Assessment: Kyphotic  Communication   Communication: No difficulties  Cognition Arousal/Alertness: Awake/alert Behavior During Therapy: WFL for tasks assessed/performed Overall Cognitive Status: Within Functional Limits for tasks assessed                                          General Comments      Exercises     Assessment/Plan    PT Assessment Patient needs continued PT services  PT Problem List Decreased strength;Decreased mobility;Decreased activity tolerance;Decreased balance       PT Treatment Interventions Gait training;Stair training;Functional mobility training;Therapeutic activities;Patient/family education;Therapeutic exercise    PT Goals (Current goals can be found in the Care Plan section)  Acute Rehab PT Goals Patient Stated Goal: to return home with daughter assist PT Goal Formulation: With patient/family Time For Goal Achievement: 01/15/22 Potential to Achieve Goals: Good    Frequency Min 2X/week     Co-evaluation               AM-PAC PT "6 Clicks" Mobility  Outcome Measure Help needed turning from your back to your side while in a flat bed without using bedrails?: A Little Help needed moving from lying on your back to sitting on the side of a flat bed without using bedrails?: A Little Help needed moving to and from a bed to a chair (including a wheelchair)?: A Little Help needed standing up from a chair using your arms (e.g., wheelchair or bedside chair)?: A Little Help needed to walk in hospital room?: A Little Help needed climbing 3-5 steps with a railing? : A Little 6 Click Score: 18    End of Session   Activity Tolerance: Patient tolerated treatment well Patient left: in chair;with call bell/phone within reach;with family/visitor present Nurse Communication: Mobility status PT Visit Diagnosis: Muscle weakness (generalized) (M62.81);Difficulty in walking,  not elsewhere classified (R26.2);Repeated falls (R29.6)    Time: [id number] PT Time Calculation (min) (ACUTE ONLY): 10 min   Charges:   PT Evaluation $PT Eval Moderate Complexity: 1 Mod          Carmyn Hamm, PT, GCS 01/09/22,12:34 PM

## 2022-01-09 NOTE — Progress Notes (Signed)
Progress Note  Patient Name: Tricia Edwards Date of Encounter: 01/09/2022  Primary Cardiologist: Rockey Situ  Subjective   Converted to sinus rhythm off tele. No chest pain, dyspnea, palpitations, dizziness, presyncope, or syncope.   Inpatient Medications    Scheduled Meds:  apixaban  5 mg Oral BID   atorvastatin  10 mg Oral Daily   calcium-vitamin D   Oral Daily   cefdinir  300 mg Oral BID   digoxin  0.5 mg Oral Once   ferrous sulfate  324 mg Oral Once per day on Mon Wed Fri   fludrocortisone  0.5 mg Oral Daily   gabapentin  300 mg Oral BID   midodrine  2.5 mg Oral TID   pantoprazole  40 mg Oral Daily   phenytoin  200 mg Oral Daily   And   phenytoin  100 mg Oral QHS   sodium chloride flush  3 mL Intravenous Q12H   Continuous Infusions:  PRN Meds: acetaminophen **OR** acetaminophen, ondansetron **OR** ondansetron (ZOFRAN) IV, senna   Vital Signs    Vitals:   01/08/22 2100 01/08/22 2311 01/09/22 0454 01/09/22 0800  BP:  (!) 143/70 (!) 141/51 (!) 157/64  Pulse:  72 65 65  Resp: 13 19 15 18   Temp:  98.7 F (37.1 C) 98.6 F (37 C) 98.1 F (36.7 C)  TempSrc:      SpO2:  98% 97% 97%  Weight:   75.4 kg   Height:        Intake/Output Summary (Last 24 hours) at 01/09/2022 0920 Last data filed at 01/09/2022 0515 Gross per 24 hour  Intake 1100.27 ml  Output 650 ml  Net 450.27 ml   Filed Weights   01/08/22 1254 01/09/22 0454  Weight: 77.1 kg 75.4 kg    Telemetry    SR - Personally Reviewed  ECG    No new tracings - Personally Reviewed  Physical Exam   GEN: No acute distress.   Neck: No JVD. Cardiac: RRR, no murmurs, rubs, or gallops.  Respiratory: Clear to auscultation bilaterally.  GI: Soft, nontender, non-distended.   MS: No edema; No deformity. Neuro:  Alert and oriented x 3; Nonfocal.  Psych: Normal affect.  Labs    Chemistry Recent Labs  Lab 01/08/22 1230 01/09/22 0509  NA 139 143  K 3.7 3.4*  CL 104 109  CO2 24 27  GLUCOSE 119*  106*  BUN 14 12  CREATININE 1.28* 0.85  CALCIUM 8.4* 7.8*  PROT 7.4  --   ALBUMIN 3.5  --   AST 39  --   ALT 23  --   ALKPHOS 157*  --   BILITOT 0.8  --   GFRNONAA 40* >60  ANIONGAP 11 7     Hematology Recent Labs  Lab 01/08/22 1230 01/09/22 0509  WBC 7.6 5.9  RBC 3.45* 2.79*  HGB 10.1* 8.2*  HCT 33.5* 26.6*  MCV 97.1 95.3  MCH 29.3 29.4  MCHC 30.1 30.8  RDW 12.7 12.6  PLT 323 291    Cardiac EnzymesNo results for input(s): TROPONINI in the last 168 hours. No results for input(s): TROPIPOC in the last 168 hours.   BNPNo results for input(s): BNP, PROBNP in the last 168 hours.   DDimer No results for input(s): DDIMER in the last 168 hours.   Radiology    CT HEAD WO CONTRAST (5MM)  Result Date: 01/08/2022 IMPRESSION: 1. No acute intracranial abnormality. 2. Partial effusion of the right mastoid air cells. Electronically Signed  By: Iven Finn M.D.   On: 01/08/2022 15:29   DG Chest Portable 1 View  Result Date: 01/08/2022 IMPRESSION: Cardiomegaly. There are no signs of pulmonary edema or new focal infiltrates. Electronically Signed   By: Elmer Picker M.D.   On: 01/08/2022 16:29    Cardiac Studies   2D echo 01/18/2021: 1. Left ventricular ejection fraction, by estimation, is 60 to 65%. The  left ventricle has normal function. The left ventricle has no regional  wall motion abnormalities. There is moderate left ventricular hypertrophy.  Left ventricular diastolic  parameters are consistent with Grade I diastolic dysfunction (impaired  relaxation).   2. Right ventricular systolic function is normal. The right ventricular  size is normal.   3. Left atrial size was mildly dilated. __________  Nuclear stress test 01/23/2014 ([facility]): IMPRESSION:    Lexiscan nuclear stress test is negative for ischemia.  No prior report available for comparison.    Patient Profile     88 y.o. female with history of persistent Afib s/p DCCV in 08/2021 in  [place], HFpEF, orthostatic hypotension with recurrent syncope, HTN, and HLD who we were asked to evaluate on 2/21 for Afib with RVR, syncope, and elevated troponin.   Assessment & Plan    1. Syncope: -Felt to be vasovagal in etiology, though Afib with RVR noted upon presentation may have contributed in the setting of more precipitous drops in her BP -Tele without significant arrhythmias, bradycardia, high grade AV block, and prolonged pauses -PTA midodrine and fludrocortisone  -TED hose -Maintain adequate hydration   2. Persistent Afib with RVR: -Status post DCCV in 08/2021 at outside hospital with recommendation at that time to consider amiodarone for recurrent Afib -Converted to sinus rhythm off telemetry, unable to assess for possible post termination pause -No indication for further digoxin, would ideally avoid this medication given her advanced age and with concomitant use with phenytoin  -After discussion with the patient and her daughter present, along with a daughter on the speaker phone, all have agreed to defer addition of amiodarone at this time with reconsideration of this if she has recurrent Afib -Avoid beta blocker or CCB with known orthostatic hypotension -CHADS2VASc 5 (CHF, HTN, age x 2, sex category) -Eliquis 5 mg bid (dose not meet reduced dosing criteria)  3. Demand ischemia: -Never with chest pain -Mildly elevated troponin with widespread ST segment changes on presenting EKG in the setting of Afib with RVR -Prior nuclear stress test in 2015 negative for ischemia -After discussion with the patient and her daughters, they all would like to avoid cardiac testing, including nuclear stress testing -Patient indicates she would not want to pursue invasive ischemic testing such as cardiac cath  4. Hypokalemia: -Replete to goal 4.0   5. Anemia: -Baseline Hgb appears to be around 10, currently 8.2 -Query volume depletion, as she has received 2 L of normal saline  boluses  -Monitor on [place]      For questions or updates, please contact [place] Please consult [email or web address] for contact info under Cardiology/STEMI.    Signed, Christell Faith, PA-C [place] Pager: [phone] 01/09/2022, 9:20 AM

## 2022-01-13 LAB — CULTURE, BLOOD (ROUTINE X 2)
Culture: NO GROWTH
Culture: NO GROWTH

## 2022-02-11 ENCOUNTER — Ambulatory Visit: Payer: Medicare Other | Admitting: Medical

## 2022-02-11 NOTE — Progress Notes (Deleted)
?Cardiology Office Note:   ? ?Date:  02/11/2022  ? ?ID:  Tricia Edwards, DOB [birth year], MRN [id number] ? ?PCP:  System, Provider Not In  ?[place] Cardiologist:  None  ?[place] Electrophysiologist:  None  ? ?Referring MD: No ref. provider found  ? ?Chief Complaint: Hospital follow-up ? ?History of Present Illness:   ? ?Tricia Edwards is a 88 y.o. female with a hx of atrial fibrillation status post cardioversion in 08/2021 in [place], orthostatic hypotension with recurrent syncope, chronic HFpEF, hypertension, and hyperlipidemia, who is being seen 01/08/2022 for the evaluation of atrial fibrillation and syncope who presents for follow-up.  ? ?She was admitted for syncope felt to be vasovagal in nature, found to be in Afib RVR. High rates lead to hypotension. She spontaneously converted back to NSR .Digoxin was held.  ? ?Today, ? ?Past Medical History:  ?Diagnosis Date  ? Hypertension   ? Paroxysmal A-fib ([place])   ? ? ?Past Surgical History:  ?Procedure Laterality Date  ? APPENDECTOMY    ? KNEE SURGERY    ? ? ?Current Medications: ?No outpatient medications have been marked as taking for the 02/11/22 encounter (Appointment) with Kathlen Mody, Xiomar Crompton H, PA-C.  ?  ? ?Allergies:   Pneumovax [pneumococcal polysaccharide vaccine] and Pneumococcal vaccine  ? ?Social History  ? ?Socioeconomic History  ? Marital status: Widowed  ?  Spouse name: Not on file  ? Number of children: Not on file  ? Years of education: Not on file  ? Highest education level: Not on file  ?Occupational History  ? Not on file  ?Tobacco Use  ? Smoking status: Former  ? Smokeless tobacco: Never  ?Substance and Sexual Activity  ? Alcohol use: Not Currently  ? Drug use: Not Currently  ? Sexual activity: Not on file  ?Other Topics Concern  ? Not on file  ?Social History Narrative  ? Not on file  ? ?Social Determinants of Health  ? ?Financial Resource Strain: Not on file  ?Food Insecurity: Not on file  ?Transportation Needs: Not on file   ?Physical Activity: Not on file  ?Stress: Not on file  ?Social Connections: Not on file  ?  ? ?Family History: ?The patient's ***family history is not on file. ? ?ROS:   ?Please see the history of present illness.    ?*** All other systems reviewed and are negative. ? ?EKGs/Labs/Other Studies Reviewed:   ? ?The following studies were reviewed today: ?*** ? ?EKG:  EKG is *** ordered today.  The ekg ordered today demonstrates *** ? ?Recent Labs: ?01/08/2022: ALT 23; Magnesium 2.0 ?01/09/2022: BUN 12; Creatinine, Ser 0.85; Hemoglobin 8.2; Platelets 291; Potassium 3.4; Sodium 143  ?Recent Lipid Panel ?No results found for: CHOL, TRIG, HDL, CHOLHDL, VLDL, LDLCALC, LDLDIRECT ? ? ?Risk Assessment/Calculations:   ?{Does this patient have ATRIAL FIBRILLATION?[phone]} ? ? ?Physical Exam:   ? ?VS:  There were no vitals taken for this visit.   ? ?Wt Readings from Last 3 Encounters:  ?01/09/22 166 lb 3.6 oz (75.4 kg)  ?08/14/21 185 lb (83.9 kg)  ?02/01/21 201 lb (91.2 kg)  ?  ? ?GEN: *** Well nourished, well developed in no acute distress ?HEENT: Normal ?NECK: No JVD; No carotid bruits ?LYMPHATICS: No lymphadenopathy ?CARDIAC: ***RRR, no murmurs, rubs, gallops ?RESPIRATORY:  Clear to auscultation without rales, wheezing or rhonchi  ?ABDOMEN: Soft, non-tender, non-distended ?MUSCULOSKELETAL:  No edema; No deformity  ?SKIN: Warm and dry ?NEUROLOGIC:  Alert and oriented x 3 ?PSYCHIATRIC:  Normal affect  ? ?  ASSESSMENT:   ? ?No diagnosis found. ?PLAN:   ? ?In order of problems listed above: ? ?Syncope ? ?Afib RVR ? ?Orthostatic hypotension ? ?UTI ? ?CKD stage 3 ? ?Disposition: Follow up {follow up:[id number]} with ***  ? ?Shared Decision Making/Informed Consent   ?{Are you ordering a CV Procedure (e.g. stress test, cath, DCCV, TEE, etc)?   Press F2        :[id number]  ? ? ?Signed, ?Noble Bodie Ninfa Meeker, PA-C  ?02/11/2022 7:03 AM    ?[facility] ? ?

## 2022-03-15 ENCOUNTER — Ambulatory Visit (INDEPENDENT_AMBULATORY_CARE_PROVIDER_SITE_OTHER): Payer: Medicare Other | Admitting: Medical

## 2022-03-15 ENCOUNTER — Ambulatory Visit (INDEPENDENT_AMBULATORY_CARE_PROVIDER_SITE_OTHER): Payer: Medicare Other

## 2022-03-15 ENCOUNTER — Encounter: Payer: Self-pay | Admitting: Medical

## 2022-03-15 VITALS — BP 80/49 | HR 69 | Ht 67.0 in | Wt 166.0 lb

## 2022-03-15 DIAGNOSIS — R778 Other specified abnormalities of plasma proteins: Secondary | ICD-10-CM | POA: Diagnosis not present

## 2022-03-15 DIAGNOSIS — I951 Orthostatic hypotension: Secondary | ICD-10-CM | POA: Diagnosis not present

## 2022-03-15 DIAGNOSIS — I480 Paroxysmal atrial fibrillation: Secondary | ICD-10-CM | POA: Diagnosis not present

## 2022-03-15 DIAGNOSIS — R55 Syncope and collapse: Secondary | ICD-10-CM

## 2022-03-15 MED ORDER — FLUDROCORTISONE ACETATE 0.1 MG PO TABS: 0.2000 mg | ORAL_TABLET | Freq: Two times a day (BID) | ORAL | 2 refills | Status: DC

## 2022-03-15 NOTE — Progress Notes (Signed)
?Cardiology Office Note:   ? ?Date:  03/15/2022  ? ?ID:  Tricia Edwards, DOB 01-22-1930, MRN [id number] ? ?PCP:  System, Provider Not In  ?[place] Cardiologist: Dr. Rockey Situ  ?[place] Electrophysiologist:  None  ? ?Referring MD: No ref. provider found  ? ?Chief Complaint: hospital follow-up ? ?History of Present Illness:   ? ?Tricia Edwards is a 88 y.o. female with a hx of persistent Afib s/p cardioversion in 08/2021 in [place], orthostatic hypotension with recurrent syncope, chronic HFpEF, HTN and HLD and is being seen for hospital follow-up.  ? ?She has a long history of orthostasis managed by cardiologist in [place], on high dose Florinef with midodrine 3 times daily. ? ?She was recently admitted for syncope felt to be vasovagal vs afib RVR with drops in BP. Upon evaluation by cardiology she was back in NSR. She had mild troponin elevation felt to be from afib RVR with suspected underlying ischmia. Amiodarone and digoxin were held. Family did not want to pursue any further ischemic testing.  ? ?Today, the patient is accompanied by family. Still has some syncopal episodes. Sometimes she knows she will pass out and sometimes she has no prodromal symptoms. She had another syncopal episode last week while taking a bath. She had someone there so it was witnesses. Similar to multiple prior episodes. She denies chest pain, SOB, LLE. Still do not want to pursue ischemic testing. Will not greatly improve quality of life. She wears abdominal binder and thigh highs, would like prescription.  ? ?Past Medical History:  ?Diagnosis Date  ? Hypertension   ? Paroxysmal A-fib ([place])   ? ? ?Past Surgical History:  ?Procedure Laterality Date  ? APPENDECTOMY    ? KNEE SURGERY    ? ? ?Current Medications: ?Current Meds  ?Medication Sig  ? atorvastatin (LIPITOR) 10 MG tablet Take 10 mg by mouth daily.  ? Calcium Carb-Cholecalciferol (CALCIUM 600/VITAMIN D PO) Take 1 tablet by mouth daily.  ? ELIQUIS 5 MG TABS tablet Take 5  mg by mouth 2 (two) times daily.  ? ferrous sulfate 324 MG TBEC Take 324 mg by mouth 3 (three) times a week. Take 1 tablet every morning, and 1 tablet in the evening every Wednesday and Saturday  ? gabapentin (NEURONTIN) 300 MG capsule Take 300 mg by mouth 2 (two) times daily.  ? midodrine (PROAMATINE) 5 MG tablet Take 5 mg by mouth 3 (three) times daily with meals.  ? omeprazole (PRILOSEC) 20 MG capsule Take 20 mg by mouth daily.  ? phenytoin (DILANTIN) 100 MG ER capsule Take 200 mg by mouth 2 (two) times daily. Take 200 mg every other morning alternating with 100 mg, and 100 mg capsule daily in the evening  ? senna (SENOKOT) 8.6 MG TABS tablet Take 1 tablet by mouth daily as needed for mild constipation.  ? [DISCONTINUED] fludrocortisone (FLORINEF) 0.1 MG tablet Take 0.1 mg by mouth. Two tablets in the morning and one tablet at noon  ?  ? ?Allergies:   Pneumovax [pneumococcal polysaccharide vaccine] and Pneumococcal vaccine  ? ?Social History  ? ?Socioeconomic History  ? Marital status: Widowed  ?  Spouse name: Not on file  ? Number of children: Not on file  ? Years of education: Not on file  ? Highest education level: Not on file  ?Occupational History  ? Not on file  ?Tobacco Use  ? Smoking status: Former  ? Smokeless tobacco: Never  ?Vaping Use  ? Vaping Use: Never used  ?Substance and  Sexual Activity  ? Alcohol use: Not Currently  ? Drug use: Not Currently  ? Sexual activity: Not on file  ?Other Topics Concern  ? Not on file  ?Social History Narrative  ? Not on file  ? ?Social Determinants of Health  ? ?Financial Resource Strain: Not on file  ?Food Insecurity: Not on file  ?Transportation Needs: Not on file  ?Physical Activity: Not on file  ?Stress: Not on file  ?Social Connections: Not on file  ?  ? ?Family History: ?The patient's family history is not on file. ? ?ROS:   ?Please see the history of present illness.    ? All other systems reviewed and are negative. ? ?EKGs/Labs/Other Studies Reviewed:   ? ?The  following studies were reviewed today: ? ?  ?2D echo 01/18/2021: ?1. Left ventricular ejection fraction, by estimation, is 60 to 65%. The  ?left ventricle has normal function. The left ventricle has no regional  ?wall motion abnormalities. There is moderate left ventricular hypertrophy.  ?Left ventricular diastolic  ?parameters are consistent with Grade I diastolic dysfunction (impaired  ?relaxation).  ? 2. Right ventricular systolic function is normal. The right ventricular  ?size is normal.  ? 3. Left atrial size was mildly dilated. ?__________ ?  ?Nuclear stress test 01/23/2014 ([facility]): ?IMPRESSION:    ?Lexiscan nuclear stress test is negative for ischemia.  ?No prior report available for comparison.   ? ?EKG:  EKG is  ordered today.  The ekg ordered today demonstrates NSR 69bpm, LAD, no significant ST/T wave changes ? ?Recent Labs: ?01/08/2022: ALT 23; Magnesium 2.0 ?01/09/2022: BUN 12; Creatinine, Ser 0.85; Hemoglobin 8.2; Platelets 291; Potassium 3.4; Sodium 143  ?Recent Lipid Panel ?No results found for: CHOL, TRIG, HDL, CHOLHDL, VLDL, LDLCALC, LDLDIRECT ? ? ?Physical Exam:   ? ?VS:  BP (!) 80/49 (BP Location: Left Arm, Patient Position: Sitting, Cuff Size: Large)   Pulse 69   Ht 5\' 7"  (1.702 m)   Wt 166 lb (75.3 kg)   SpO2 97%   BMI 26.00 kg/m?    ? ?Wt Readings from Last 3 Encounters:  ?03/15/22 166 lb (75.3 kg)  ?01/09/22 166 lb 3.6 oz (75.4 kg)  ?08/14/21 185 lb (83.9 kg)  ?  ? ?GEN:  Well nourished, well developed in no acute distress ?HEENT: Normal ?NECK: No JVD; No carotid bruits ?LYMPHATICS: No lymphadenopathy ?CARDIAC: RRR, no murmurs, rubs, gallops ?RESPIRATORY:  Clear to auscultation without rales, wheezing or rhonchi  ?ABDOMEN: Soft, non-tender, non-distended ?MUSCULOSKELETAL:  No edema; No deformity  ?SKIN: Warm and dry ?NEUROLOGIC:  Alert and oriented x 3 ?PSYCHIATRIC:  Normal affect  ? ?ASSESSMENT:   ? ?1. Syncope and collapse   ?2. Orthostatic hypotension   ?3. Elevated troponin   ?4.  Paroxysmal A-fib ([place])   ? ?PLAN:   ? ?In order of problems listed above: ? ?Syncope ?Patient with long history of orthostatic hypotension and syncopal episodes. Recent admission for syncope, possible vasovagal vs afib RVR with more prominent drops in BP. Work-up was unremarkable. She was sent home on ?midodrine 5mg  TID and florinef 0.2mg  in the am and 0.1mg  in the pm. She had another episode last week while bathing, it was witnessed. Episodes are infrequent and unpredictable. Otherwise she feels OK. She wears thigh high stockings, will give rx for this. She also wears abdominal binder. I will increase florinef to 0.2mg  BID. They will call back to report midodrine dose, suspect she is on 10mg  TID. We will get a 2 week  heart monitor.  ? ?Elevated troponin ?Troponin elevated to 40s in the hospital. No anginal symptoms reported. No plan for ischemic work-up.  ? ?Paroxysmal Afib ?Amiodarone previously stopped. EKG today shows NSR. Patient denies known recurrence of Afib. She is on Eliquis for stroke ppx. Plan for heart monitor as above.  ? ?Disposition: Follow up in 6-8 week(s) with MD  ? ? ?Signed, ?Pieter Fooks Ninfa Meeker, PA-C  ?03/15/2022 4:33 PM    ?[place]  ?

## 2022-03-15 NOTE — Patient Instructions (Addendum)
Medication Instructions:  ?Your physician has recommended you make the following change in your medication:  ? ?INCREASE Florinef to 0.2 mg (2 tablets) twice a day. An Rx has been sent to your pharmacy. ? ?Please call back to confirm your Midodrine dosage ? ? ?*If you need a refill on your cardiac medications before your next appointment, please call your pharmacy* ? ? ?Lab Work: ?None ordered ?If you have labs (blood work) drawn today and your tests are completely normal, you will receive your results only by: ?MyChart Message (if you have MyChart) OR ?A paper copy in the mail ?If you have any lab test that is abnormal or we need to change your treatment, we will call you to review the results. ? ? ?Testing/Procedures: ?Your provider has ordered a heart monitor to wear for 14 days. This will be mailed to your home with instructions on placement. Once you have finished the time frame requested, you will return monitor in box provided. ? ?  ? ? ?Follow-Up: ?At [facility], you and your health needs are our priority.  As part of our continuing mission to provide you with exceptional heart care, we have created designated Provider Care Teams.  These Care Teams include your primary Cardiologist (physician) and Advanced Practice Providers (APPs -  Physician Assistants and Nurse Practitioners) who all work together to provide you with the care you need, when you need it. ? ?We recommend signing up for the patient portal called "MyChart".  Sign up information is provided on this After Visit Summary.  MyChart is used to connect with patients for Virtual Visits (Telemedicine).  Patients are able to view lab/test results, encounter notes, upcoming appointments, etc.  Non-urgent messages can be sent to your provider as well.   ?To learn more about what you can do with MyChart, go to [email or web address].   ? ?Your next appointment:   ?6-8 week(s) ? ?The format for your next appointment:   ?In Person ? ?Provider:   ?You  may see Ida Rogue, MD or one of the following Advanced Practice Providers on your designated Care Team:   ?Cadence Kathlen Mody, [place] ? ? ? ?Other Instructions ?N/A ? ?Important Information About [place] ? ? ? ? ? ? ?

## 2022-03-18 NOTE — Telephone Encounter (Signed)
Furth, Cadence H, PA-C  You 2 days ago  ? ?Ok, thank you for he update. Can we make sure chart reflects this? Thanks   ? ?

## 2022-03-21 IMAGING — CR DG CHEST 2V
1 series · 2 of 2 positions shown · non-contrast
Comparison: None

CLINICAL DATA: Weakness, syncope, loss of consciousness, fatigue,
nausea

EXAM:
CHEST - 2 VIEW

[Series 1: dg chest 2 view · 0.14mm/px · 2 of 2 slices shown]
[im 1/2]
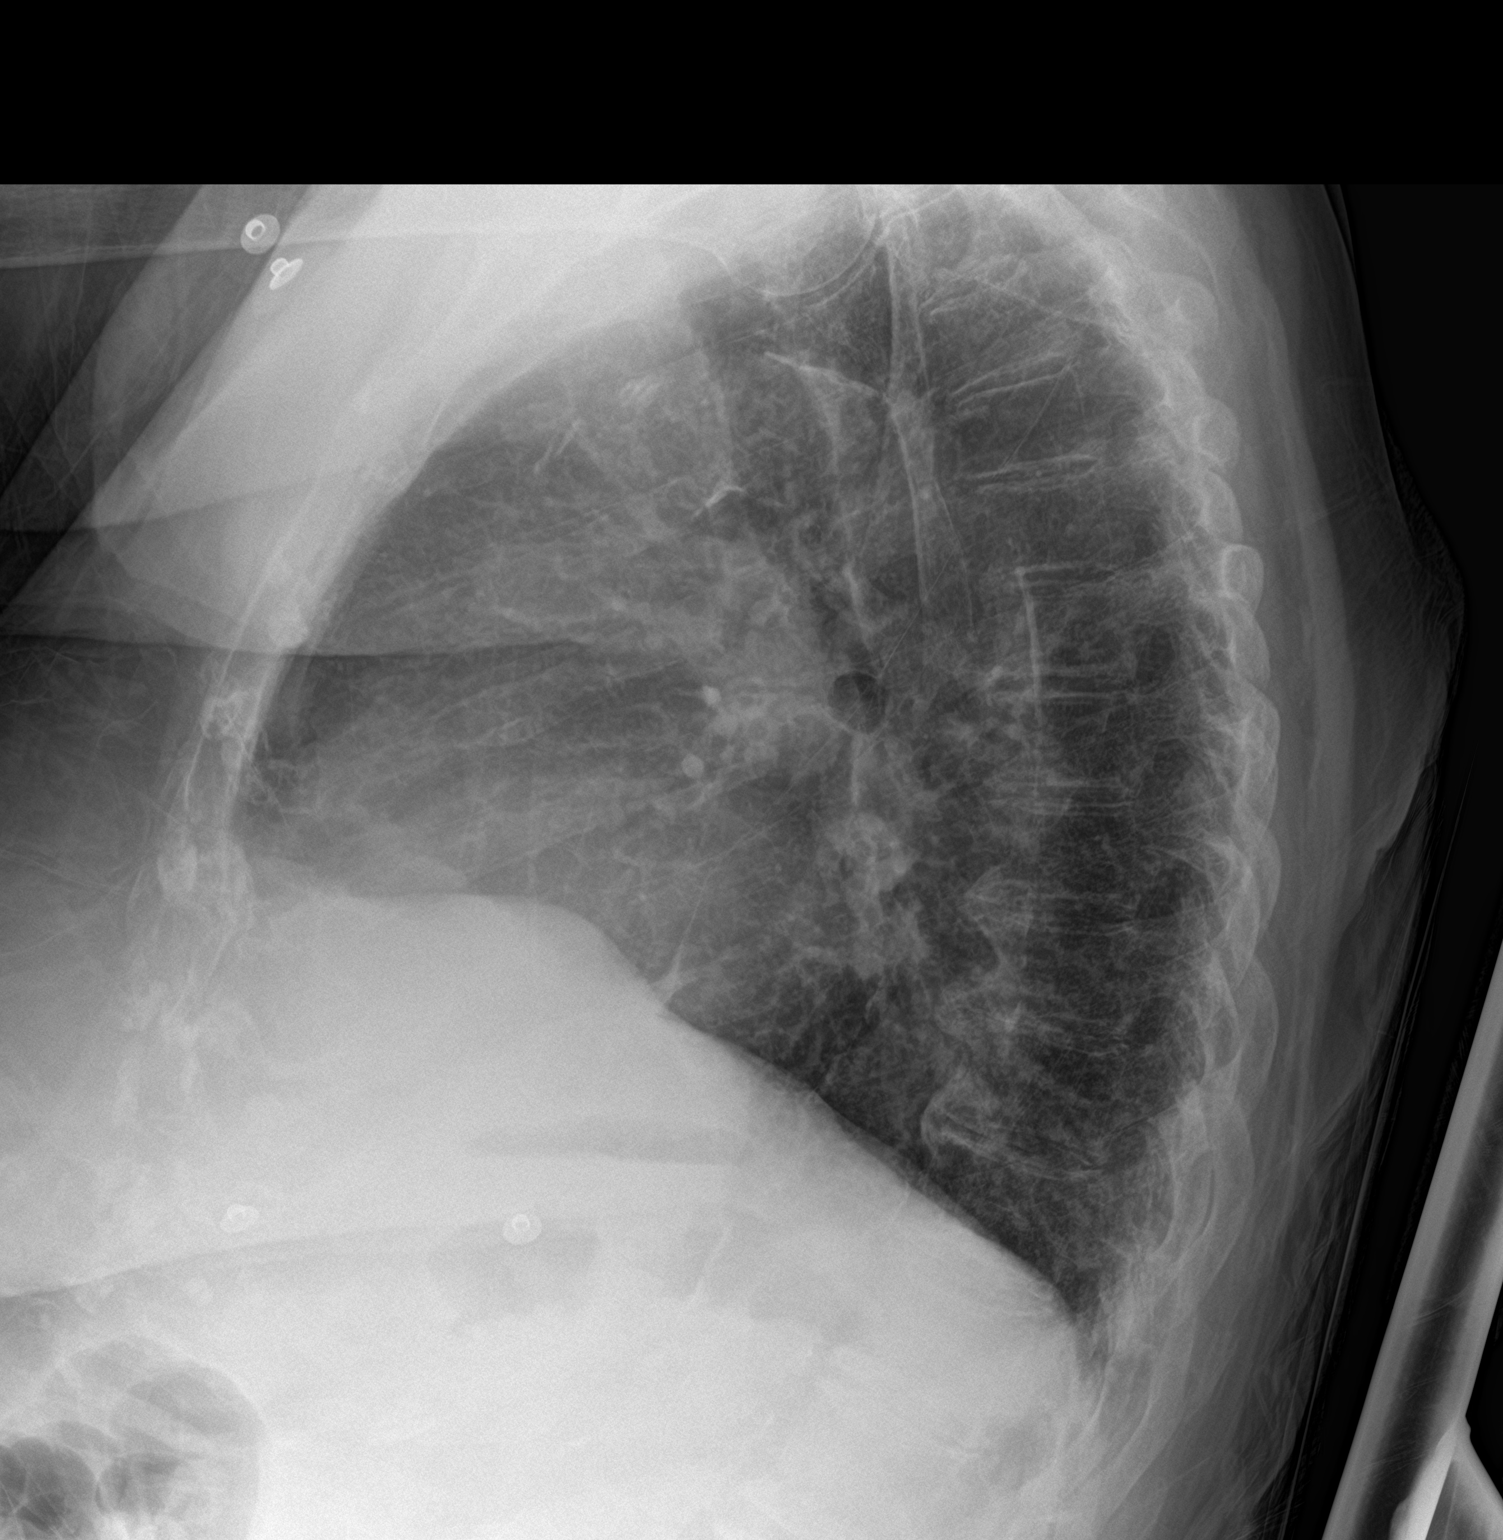
[im 2/2]
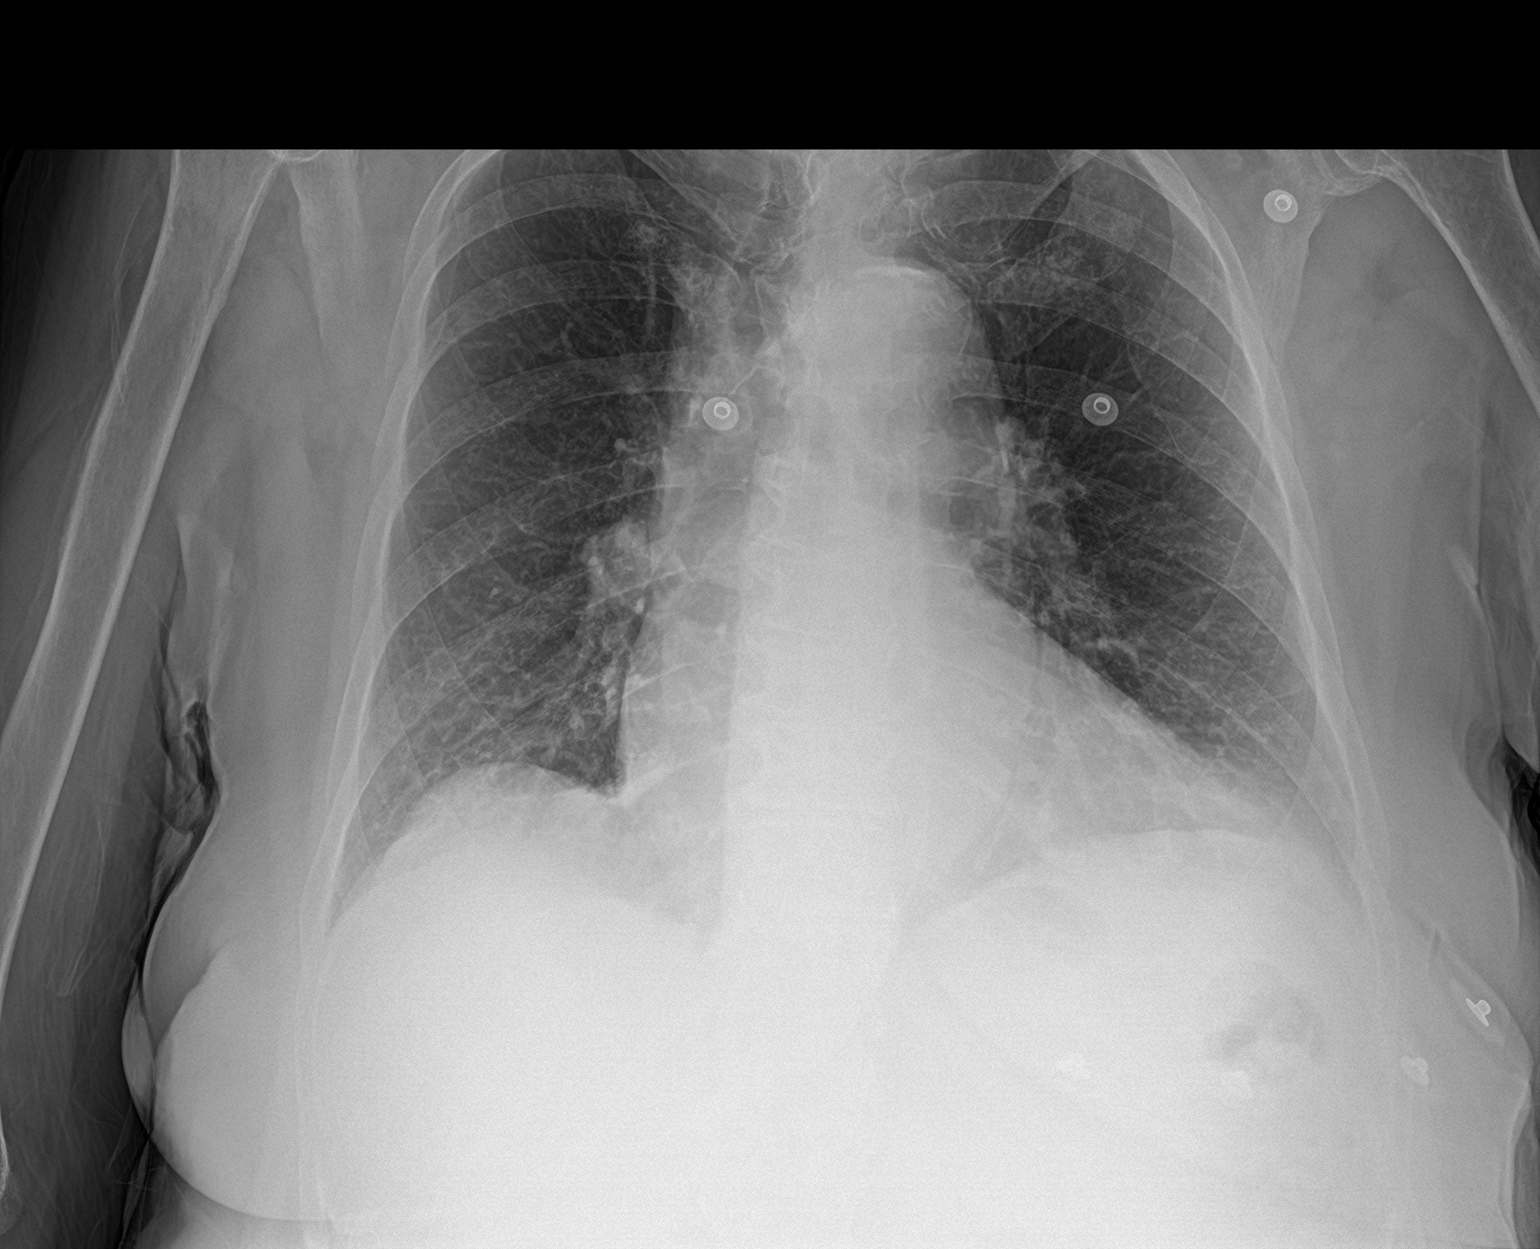

[2 of 2 positions shown; findings below may reference images not displayed]

FINDINGS: Upper normal size of cardiac silhouette.

Calcified tortuous aorta.

Mediastinal contours and pulmonary vascularity normal.

Lungs clear.

Mild peribronchial thickening.

No pleural effusion or pneumothorax.

Bones demineralized with endplate spur formation thoracic spine.
IMPRESSION: Bronchitic changes without infiltrate.

Aortic Atherosclerosis ([id number]-[id number].A).

## 2022-03-22 DIAGNOSIS — R55 Syncope and collapse: Secondary | ICD-10-CM

## 2022-04-24 ENCOUNTER — Encounter: Payer: Self-pay | Admitting: Cardiovascular Disease

## 2022-04-24 ENCOUNTER — Other Ambulatory Visit
Admission: RE | Admit: 2022-04-24 | Discharge: 2022-04-24 | Disposition: A | Discharge status: Home / Self Care | Payer: Medicare Other | Source: Ambulatory Visit | Attending: Cardiovascular Disease | Admitting: Cardiovascular Disease

## 2022-04-24 ENCOUNTER — Ambulatory Visit (INDEPENDENT_AMBULATORY_CARE_PROVIDER_SITE_OTHER): Payer: Medicare Other | Admitting: Cardiovascular Disease

## 2022-04-24 VITALS — BP 130/60 | HR 69 | Ht 67.0 in | Wt 166.4 lb

## 2022-04-24 DIAGNOSIS — I480 Paroxysmal atrial fibrillation: Secondary | ICD-10-CM

## 2022-04-24 DIAGNOSIS — I951 Orthostatic hypotension: Secondary | ICD-10-CM | POA: Diagnosis not present

## 2022-04-24 DIAGNOSIS — R55 Syncope and collapse: Secondary | ICD-10-CM

## 2022-04-24 DIAGNOSIS — D509 Iron deficiency anemia, unspecified: Secondary | ICD-10-CM | POA: Diagnosis not present

## 2022-04-24 LAB — CBC
HCT: 32 % — ABNORMAL LOW (ref 36.0–46.0)
Hemoglobin: 9.4 g/dL — ABNORMAL LOW (ref 12.0–15.0)
MCH: 27.2 pg (ref 26.0–34.0)
MCHC: 29.4 g/dL — ABNORMAL LOW (ref 30.0–36.0)
MCV: 92.8 fL (ref 80.0–100.0)
Platelets: 243 10*3/uL (ref 150–400)
RBC: 3.45 MIL/uL — ABNORMAL LOW (ref 3.87–5.11)
RDW: 13.6 % (ref 11.5–15.5)
WBC: 5.4 10*3/uL (ref 4.0–10.5)
nRBC: 0 % (ref 0.0–0.2)

## 2022-04-24 LAB — BASIC METABOLIC PANEL
Anion gap: 4 — ABNORMAL LOW (ref 5–15)
BUN: 18 mg/dL (ref 8–23)
CO2: 28 mmol/L (ref 22–32)
Calcium: 8.4 mg/dL — ABNORMAL LOW (ref 8.9–10.3)
Chloride: 108 mmol/L (ref 98–111)
Creatinine, Ser: 0.86 mg/dL (ref 0.44–1.00)
GFR, Estimated: >60 mL/min (ref >60)
Glucose, Bld: 88 mg/dL (ref 70–99)
Potassium: 3.7 mmol/L (ref 3.5–5.1)
Sodium: 140 mmol/L (ref 135–145)

## 2022-04-24 NOTE — Progress Notes (Signed)
Cardiology Office Note  Date:  04/24/2022   ID:  Tricia Edwards, DOB 1930/02/15, MRN [id number]  PCP:  System, Provider Not In   Chief Complaint  Patient presents with   6 week follow up     Discuss Zio monitor. "Doing well." Medications reviewed by the patient verbally.     HPI:  88 y.o. female with medical history significant for  paroxysmal A-fib on anticoagulation, history of cardioversion orthostatic hypotension with episodes of syncope and near syncope,  chronic labile blood pressure on midodrine and fludrocortisone daily  trigeminal neuralgia on carbamazepine  brought into the ER January 08, 2022 by her daughter for evaluation after she had several syncopal episodes that were witnessed. She presents today for office follow-up for near-syncope, syncope, atrial fibrillation  Presents today with her daughter Reports no recent episodes of near syncope or syncope on Florinef 0.2 mg twice daily and midodrine 10 mg 3 times daily No falls, gait improving Continues to wear thigh-high compressions, occasionally wearing abdominal binder  staying hydrated  Daughter is not aware of any near syncope spells Also unaware of any episodes of atrial fibrillation  EKG personally reviewed by myself on todays visit Normal sinus rhythm rate 69 bpm no significant ST-T wave changes  Recent events as below  first episode while sitting on the commode.  Patient states that she had strained to have a bowel movement and subsequently felt dizzy and lightheaded.  Per her daughter she had a transient loss of consciousness and she took her back to her room and put her in bed. Patient had 2 more episodes while ambulatory and each time she felt dizzy and lightheaded and went limp.  Her daughter was concerned about the frequency of these episodes and states that she usually has 1 but never this many in 1 day. Patient has been on antibiotic therapy for UTI but denies having any fever, no chills, no nausea, no  vomiting, no abdominal pain, no leg swelling, no headache, no cough, no palpitations, no diaphoresis, no blurred vision, no focal deficit. She received 2 L of IV fluids in the ER Presenting in atrial fibrillation, converted to sinus rhythm in the hospital after digoxin given Family declined amiodarone  Echocardiogram showing LVH, small LV cavity, morbid obesity, deconditioned Long history of orthostasis, near syncope and syncope dating back several years, managed by cardiology in [place] -Discussed various strategies with him including taking water, midodrine first thing in the morning before getting up Avoiding hot meals, large meals especially if she has not had her midodrine -Consider abdominal binder, compression hose Staying well-hydrated Checking orthostatics on a regular basis especially before going out shopping Extra midodrine as needed for orthostatic numbers   Zio monitor Apr 10, 2022 reviewed in detail Patient had a min HR of 57 bpm, max HR of 160 bpm, and avg HR of 71 bpm.   115 Supraventricular Tachycardia runs occurred, the run with the fastest interval lasting 5 beats with a max rate of 160 bpm, the longest lasting 14 beats with an avg rate of 92 bpm. (Not patient triggered)   Isolated SVEs were rare (<1.0%), SVE Couplets were rare (<1.0%), and SVE Triplets were rare (<1.0%). Isolated VEs were occasional (1.2%, 16466), VE Couplets were rare (<1.0%, 33), and no VE Triplets  were present. Ventricular Bigeminy and Trigeminy were present.    Patient triggered events (3) associated with normal sinus rhythm, rare PVC   PMH:   has a past medical history of Hypertension and Paroxysmal A-fib (  HCC).  PSH:    Past Surgical History:  Procedure Laterality Date   APPENDECTOMY     KNEE SURGERY      Current Outpatient Medications  Medication Sig Dispense Refill   atorvastatin (LIPITOR) 10 MG tablet Take 10 mg by mouth daily.     Calcium Carb-Cholecalciferol (CALCIUM  600/VITAMIN D PO) Take 1 tablet by mouth daily.     ELIQUIS 5 MG TABS tablet Take 5 mg by mouth 2 (two) times daily.     ferrous sulfate 324 MG TBEC Take 324 mg by mouth 3 (three) times a week. Take 1 tablet every morning, and 1 tablet in the evening every Wednesday and Saturday     fludrocortisone (FLORINEF) 0.1 MG tablet Take 2 tablets (0.2 mg total) by mouth 2 (two) times daily. 120 tablet 2   gabapentin (NEURONTIN) 300 MG capsule Take 300 mg by mouth 2 (two) times daily.     midodrine (PROAMATINE) 5 MG tablet Take 10 mg by mouth 3 (three) times daily with meals.     omeprazole (PRILOSEC) 20 MG capsule Take 20 mg by mouth daily.     phenytoin (DILANTIN) 100 MG ER capsule Take 200 mg by mouth 2 (two) times daily. Take 200 mg every other morning alternating with 100 mg, and 100 mg capsule daily in the evening     senna (SENOKOT) 8.6 MG TABS tablet Take 1 tablet by mouth daily as needed for mild constipation.     No current facility-administered medications for this visit.    Allergies:   Pneumovax [pneumococcal polysaccharide vaccine] and Pneumococcal vaccine   Social History:  The patient  reports that she has quit smoking. She has never used smokeless tobacco. She reports that she does not currently use alcohol. She reports that she does not currently use drugs.   Family History:   family history is not on file.    Review of Systems: Review of Systems  Constitutional: Negative.   HENT: Negative.    Respiratory: Negative.    Cardiovascular: Negative.   Gastrointestinal: Negative.   Musculoskeletal: Negative.   Neurological: Negative.   Psychiatric/Behavioral: Negative.    All other systems reviewed and are negative.   PHYSICAL EXAM: VS:  BP 130/60 (BP Location: Left Arm, Patient Position: Sitting, Cuff Size: Normal)   Pulse 69   Ht 5\' 7"  (1.702 m)   Wt 166 lb 6 oz (75.5 kg)   SpO2 96%   BMI 26.06 kg/m  , BMI Body mass index is 26.06 kg/m. GEN: Well nourished, well  developed, in no acute distress HEENT: normal Neck: no JVD, carotid bruits, or masses Cardiac: RRR; no murmurs, rubs, or gallops,no edema  Respiratory:  clear to auscultation bilaterally, normal work of breathing GI: soft, nontender, nondistended, + BS MS: no deformity or atrophy Skin: warm and dry, no rash Neuro:  Strength and sensation are intact Psych: euthymic mood, full affect  Recent Labs: 01/08/2022: ALT 23; Magnesium 2.0 04/24/2022: BUN 18; Creatinine, Ser 0.86; Hemoglobin 9.4; Platelets 243; Potassium 3.7; Sodium 140    Lipid Panel No results found for: CHOL, HDL, LDLCALC, TRIG    Wt Readings from Last 3 Encounters:  04/24/22 166 lb 6 oz (75.5 kg)  03/15/22 166 lb (75.3 kg)  01/09/22 166 lb 3.6 oz (75.4 kg)       ASSESSMENT AND PLAN:  Problem List Items Addressed This Visit       Cardiology Problems   Chronic orthostatic hypotension     Other  Syncope and collapse   Other Visit Diagnoses     Paroxysmal A-fib (HCC)    -  Primary   Relevant Orders   EKG 12-Lead   CBC (Completed)   Basic Metabolic Panel (BMET) (Completed)      Paroxysmal atrial fibrillation Not on digoxin at this time Patient/daughter previously declined amiodarone Unable to use AV nodal blocking agent /calcium channel blockers, metoprolol secondary to  orthostasis For recurrent atrial fibrillation May need to consider amiodarone Tolerating Eliquis, no recent falls  Orthostatic hypotension Symptoms have stabilized on high-dose midodrine 10 mg 3 times a day, Florinef 0.2 mg twice daily, with thigh-high compression hose, hydration, abdominal binder Walking independently, no recent near-syncope or syncope episodes Etiology of above likely secondary to neurocardiogenic No changes to medications We have ordered BMP and CBC  Anemia Drop in hemoglobin down to 8 noted in February 2023 Repeat CBC ordered On Eliquis She is on daily iron   Total encounter time more than 30 minutes   Greater than 50% was spent in counseling and coordination of care with the patient    Signed, Dossie Arbourim Mitzi Lilja, M.D., Ph.D. [facility], [place] [phone]

## 2022-04-24 NOTE — Patient Instructions (Addendum)
Medication Instructions:  No changes  If you need a refill on your cardiac medications before your next appointment, please call your pharmacy.   Lab work:  Today: CBC, BMP  [facility] Mall Entrance at [facility] 1st desk on the right to check in (REGISTRATION)  Lab hours: Monday- Friday (7:30 am- 5:30 pm)   Testing/Procedures: No new testing needed  Follow-Up: At [facility], you and your health needs are our priority.  As part of our continuing mission to provide you with exceptional heart care, we have created designated Provider Care Teams.  These Care Teams include your primary Cardiologist (physician) and Advanced Practice Providers (APPs -  Physician Assistants and Nurse Practitioners) who all work together to provide you with the care you need, when you need it.  You will need a follow up appointment in 6 months  Providers on your designated Care Team:   Tricia Ducking, NP Tricia Listen, PA-C Tricia Edwards, [place]  COVID-19 Vaccine Information can be found at: [email or web address] For questions related to vaccine distribution or appointments, please email [email or web address] or call [phone].

## 2022-04-25 ENCOUNTER — Ambulatory Visit: Payer: Medicare Other | Admitting: Medical

## 2022-04-26 ENCOUNTER — Telehealth: Payer: Self-pay | Admitting: Emergency Medicine

## 2022-04-26 NOTE — Telephone Encounter (Signed)
Called patient, went over results and recommendations. Pt verbalized understanding, and questions, if any, were answered.  ?

## 2022-04-26 NOTE — Telephone Encounter (Signed)
-----   Message from Tricia Iba, MD sent at 04/24/2022  7:05 PM EDT ----- Lab work reviewed, normal BMP Remains anemic with slight trend upwards was 8 now is 9.4 Previous baseline appears around 10 Would stay on the iron as she is doing

## 2022-05-22 ENCOUNTER — Encounter: Payer: Self-pay | Admitting: Medical Oncology

## 2022-05-22 ENCOUNTER — Other Ambulatory Visit: Payer: Self-pay

## 2022-05-22 ENCOUNTER — Emergency Department: Payer: Medicare Other

## 2022-05-22 ENCOUNTER — Observation Stay
Admission: EM | Admit: 2022-05-22 | Discharge: 2022-05-23 | Disposition: A | Discharge status: Home / Self Care | Payer: Medicare Other | Attending: Internal Medicine | Admitting: Internal Medicine

## 2022-05-22 DIAGNOSIS — I130 Hypertensive heart and chronic kidney disease with heart failure and stage 1 through stage 4 chronic kidney disease, or unspecified chronic kidney disease: Secondary | ICD-10-CM | POA: Diagnosis not present

## 2022-05-22 DIAGNOSIS — Z87891 Personal history of nicotine dependence: Secondary | ICD-10-CM | POA: Insufficient documentation

## 2022-05-22 DIAGNOSIS — D509 Iron deficiency anemia, unspecified: Secondary | ICD-10-CM | POA: Diagnosis present

## 2022-05-22 DIAGNOSIS — I5033 Acute on chronic diastolic (congestive) heart failure: Secondary | ICD-10-CM | POA: Diagnosis present

## 2022-05-22 DIAGNOSIS — Z7901 Long term (current) use of anticoagulants: Secondary | ICD-10-CM | POA: Diagnosis not present

## 2022-05-22 DIAGNOSIS — G500 Trigeminal neuralgia: Secondary | ICD-10-CM | POA: Diagnosis present

## 2022-05-22 DIAGNOSIS — Z79899 Other long term (current) drug therapy: Secondary | ICD-10-CM | POA: Insufficient documentation

## 2022-05-22 DIAGNOSIS — R55 Syncope and collapse: Secondary | ICD-10-CM | POA: Diagnosis present

## 2022-05-22 DIAGNOSIS — E785 Hyperlipidemia, unspecified: Secondary | ICD-10-CM | POA: Diagnosis present

## 2022-05-22 DIAGNOSIS — I4891 Unspecified atrial fibrillation: Secondary | ICD-10-CM | POA: Diagnosis not present

## 2022-05-22 DIAGNOSIS — I951 Orthostatic hypotension: Secondary | ICD-10-CM | POA: Diagnosis present

## 2022-05-22 DIAGNOSIS — I5032 Chronic diastolic (congestive) heart failure: Secondary | ICD-10-CM | POA: Diagnosis present

## 2022-05-22 DIAGNOSIS — I480 Paroxysmal atrial fibrillation: Principal | ICD-10-CM | POA: Insufficient documentation

## 2022-05-22 DIAGNOSIS — N1831 Chronic kidney disease, stage 3a: Secondary | ICD-10-CM | POA: Diagnosis not present

## 2022-05-22 LAB — TROPONIN I (HIGH SENSITIVITY): Troponin I (High Sensitivity): 13 ng/L (ref <18)

## 2022-05-22 LAB — BASIC METABOLIC PANEL
Anion gap: 8 (ref 5–15)
BUN: 17 mg/dL (ref 8–23)
CO2: 26 mmol/L (ref 22–32)
Calcium: 8.8 mg/dL — ABNORMAL LOW (ref 8.9–10.3)
Chloride: 109 mmol/L (ref 98–111)
Creatinine, Ser: 1.06 mg/dL — ABNORMAL HIGH (ref 0.44–1.00)
GFR, Estimated: 50 mL/min — ABNORMAL LOW (ref >60)
Glucose, Bld: 111 mg/dL — ABNORMAL HIGH (ref 70–99)
Potassium: 3.5 mmol/L (ref 3.5–5.1)
Sodium: 143 mmol/L (ref 135–145)

## 2022-05-22 LAB — CBC WITH DIFFERENTIAL/PLATELET
Abs Immature Granulocytes: 0.02 10*3/uL (ref 0.00–0.07)
Basophils Absolute: 0.1 10*3/uL (ref 0.0–0.1)
Basophils Relative: 1 %
Eosinophils Absolute: 0.2 10*3/uL (ref 0.0–0.5)
Eosinophils Relative: 3 %
HCT: 37.2 % (ref 36.0–46.0)
Hemoglobin: 11.3 g/dL — ABNORMAL LOW (ref 12.0–15.0)
Immature Granulocytes: 0 %
Lymphocytes Relative: 47 %
Lymphs Abs: 3.6 10*3/uL (ref 0.7–4.0)
MCH: 28 pg (ref 26.0–34.0)
MCHC: 30.4 g/dL (ref 30.0–36.0)
MCV: 92.3 fL (ref 80.0–100.0)
Monocytes Absolute: 0.9 10*3/uL (ref 0.1–1.0)
Monocytes Relative: 12 %
Neutro Abs: 2.8 10*3/uL (ref 1.7–7.7)
Neutrophils Relative %: 37 %
Platelets: 240 10*3/uL (ref 150–400)
RBC: 4.03 MIL/uL (ref 3.87–5.11)
RDW: 14.6 % (ref 11.5–15.5)
WBC: 7.6 10*3/uL (ref 4.0–10.5)
nRBC: 0 % (ref 0.0–0.2)

## 2022-05-22 LAB — URINALYSIS, ROUTINE W REFLEX MICROSCOPIC
Bacteria, UA: NONE SEEN
Bilirubin Urine: NEGATIVE
Glucose, UA: NEGATIVE mg/dL
Hgb urine dipstick: NEGATIVE
Ketones, ur: NEGATIVE mg/dL
Leukocytes,Ua: NEGATIVE
Nitrite: NEGATIVE
Protein, ur: NEGATIVE mg/dL
Specific Gravity, Urine: 1.005 (ref 1.005–1.030)
pH: 7 (ref 5.0–8.0)

## 2022-05-22 LAB — MAGNESIUM: Magnesium: 2.1 mg/dL (ref 1.7–2.4)

## 2022-05-22 LAB — BRAIN NATRIURETIC PEPTIDE: B Natriuretic Peptide: 554 pg/mL — ABNORMAL HIGH (ref 0.0–100.0)

## 2022-05-22 MED ORDER — ONDANSETRON HCL 4 MG/2ML IJ SOLN: 4.0000 mg | Freq: Three times a day (TID) | INTRAMUSCULAR | Status: DC | PRN

## 2022-05-22 MED ORDER — MIDODRINE HCL 5 MG PO TABS
10.0000 mg | ORAL_TABLET | Freq: Once | ORAL | Status: AC
  Administered 2022-05-22: 10 mg via ORAL
  Filled 2022-05-22: qty 2

## 2022-05-22 MED ORDER — ENOXAPARIN SODIUM 40 MG/0.4ML IJ SOSY: 40.0000 mg | PREFILLED_SYRINGE | INTRAMUSCULAR | Status: DC

## 2022-05-22 MED ORDER — DILTIAZEM HCL-DEXTROSE 125-5 MG/125ML-% IV SOLN (PREMIX)
5.0000 mg/h | INTRAVENOUS | Status: DC
  Administered 2022-05-22 – 2022-05-23 (×2): 5 mg/h via INTRAVENOUS
  Filled 2022-05-22 (×2): qty 125

## 2022-05-22 MED ORDER — POTASSIUM CHLORIDE CRYS ER 20 MEQ PO TBCR
40.0000 meq | EXTENDED_RELEASE_TABLET | Freq: Once | ORAL | Status: AC
  Administered 2022-05-22: 40 meq via ORAL
  Filled 2022-05-22: qty 2

## 2022-05-22 MED ORDER — SENNA 8.6 MG PO TABS: 1.0000 | ORAL_TABLET | Freq: Every day | ORAL | Status: DC | PRN

## 2022-05-22 MED ORDER — OYSTER SHELL CALCIUM/D3 500-5 MG-MCG PO TABS
1.0000 | ORAL_TABLET | Freq: Every day | ORAL | Status: DC
  Administered 2022-05-22 – 2022-05-23 (×2): 1 via ORAL
  Filled 2022-05-22 (×2): qty 1

## 2022-05-22 MED ORDER — APIXABAN 5 MG PO TABS
5.0000 mg | ORAL_TABLET | Freq: Two times a day (BID) | ORAL | Status: DC
  Administered 2022-05-22 – 2022-05-23 (×3): 5 mg via ORAL
  Filled 2022-05-22 (×3): qty 1

## 2022-05-22 MED ORDER — DILTIAZEM HCL 25 MG/5ML IV SOLN
10.0000 mg | Freq: Once | INTRAVENOUS | Status: AC
  Administered 2022-05-22: 10 mg via INTRAVENOUS
  Filled 2022-05-22: qty 5

## 2022-05-22 MED ORDER — PHENYTOIN SODIUM EXTENDED 100 MG PO CAPS: 200.0000 mg | ORAL_CAPSULE | Freq: Two times a day (BID) | ORAL | Status: DC

## 2022-05-22 MED ORDER — ACETAMINOPHEN 325 MG PO TABS: 650.0000 mg | ORAL_TABLET | Freq: Four times a day (QID) | ORAL | Status: DC | PRN

## 2022-05-22 MED ORDER — PHENYTOIN SODIUM EXTENDED 100 MG PO CAPS
100.0000 mg | ORAL_CAPSULE | ORAL | Status: DC
  Administered 2022-05-22: 100 mg via ORAL
  Filled 2022-05-22: qty 1

## 2022-05-22 MED ORDER — ATORVASTATIN CALCIUM 10 MG PO TABS
10.0000 mg | ORAL_TABLET | Freq: Every day | ORAL | Status: DC
  Administered 2022-05-22 – 2022-05-23 (×2): 10 mg via ORAL
  Filled 2022-05-22 (×2): qty 1

## 2022-05-22 MED ORDER — PHENYTOIN SODIUM EXTENDED 100 MG PO CAPS
100.0000 mg | ORAL_CAPSULE | Freq: Every day | ORAL | Status: DC
  Administered 2022-05-22: 100 mg via ORAL
  Filled 2022-05-22 (×2): qty 1

## 2022-05-22 MED ORDER — GABAPENTIN 300 MG PO CAPS
300.0000 mg | ORAL_CAPSULE | Freq: Two times a day (BID) | ORAL | Status: DC
  Administered 2022-05-22 – 2022-05-23 (×2): 300 mg via ORAL
  Filled 2022-05-22 (×2): qty 1

## 2022-05-22 MED ORDER — FLUDROCORTISONE ACETATE 0.1 MG PO TABS
0.2000 mg | ORAL_TABLET | Freq: Once | ORAL | Status: AC
  Administered 2022-05-22: 0.2 mg via ORAL
  Filled 2022-05-22: qty 2

## 2022-05-22 MED ORDER — MIDODRINE HCL 5 MG PO TABS
10.0000 mg | ORAL_TABLET | Freq: Three times a day (TID) | ORAL | Status: DC
  Administered 2022-05-22 – 2022-05-23 (×4): 10 mg via ORAL
  Filled 2022-05-22 (×4): qty 2

## 2022-05-22 MED ORDER — PANTOPRAZOLE SODIUM 40 MG PO TBEC
40.0000 mg | DELAYED_RELEASE_TABLET | Freq: Every day | ORAL | Status: DC
  Administered 2022-05-22 – 2022-05-23 (×2): 40 mg via ORAL
  Filled 2022-05-22 (×2): qty 1

## 2022-05-22 MED ORDER — PHENYTOIN SODIUM EXTENDED 100 MG PO CAPS
200.0000 mg | ORAL_CAPSULE | ORAL | Status: DC
Start: 2022-05-23 — End: 2022-05-23
  Administered 2022-05-23: 200 mg via ORAL
  Filled 2022-05-22: qty 2

## 2022-05-22 MED ORDER — HYDRALAZINE HCL 20 MG/ML IJ SOLN
5.0000 mg | INTRAMUSCULAR | Status: DC | PRN
Start: 2022-05-22 — End: 2022-05-23

## 2022-05-22 MED ORDER — FLUDROCORTISONE ACETATE 0.1 MG PO TABS
0.2000 mg | ORAL_TABLET | Freq: Two times a day (BID) | ORAL | Status: DC
  Administered 2022-05-23: 0.2 mg via ORAL
  Filled 2022-05-22 (×3): qty 2

## 2022-05-22 MED ORDER — FERROUS SULFATE 325 (65 FE) MG PO TABS: 324.0000 mg | ORAL_TABLET | ORAL | Status: DC

## 2022-05-22 NOTE — Assessment & Plan Note (Deleted)
2D echo on 01/18/2021 showed EF of 60-85% with grade 1 diastolic dysfunction.  BNP is elevated at 554, but denies shortness of breath.  No oxygen desaturation.  No pulm edema on chest x-ray.  Patient does not seem to have CHF exacerbation, but the patient is at risk of developing hypotension. -Will not start diuretics since patient had syncope and orthostatic hypotension may have contributed partially

## 2022-05-22 NOTE — Assessment & Plan Note (Signed)
HR was up to 175, improving on Cardizem drip.  Denies chest pain or shortness breath currently  -Placed on PCU for observation -Continue Cardizem drip -Continue Eliquis

## 2022-05-22 NOTE — Assessment & Plan Note (Signed)
Hemoglobin stable 11.3 - Continue iron supplement

## 2022-05-22 NOTE — Assessment & Plan Note (Signed)
Stable -Follow-up with BMP 

## 2022-05-22 NOTE — Assessment & Plan Note (Signed)
Most likely due to combination of atrial fibrillation with RVR and orthostatic hypotension.  As above.  Patient took her home midodrine and Florinef late in the afternoon versus at 12 noon.  She did not get an evening dose.

## 2022-05-22 NOTE — Assessment & Plan Note (Signed)
Lipitor 

## 2022-05-22 NOTE — Assessment & Plan Note (Signed)
-  Continue home midodrine 10 mg 3 times daily and Florinef 0.2 mg twice a day

## 2022-05-22 NOTE — H&P (Signed)
History and Physical    Tricia Edwards [id number] DOB: August 21, 1930 DOA: 05/22/2022  Referring MD/NP/PA:   PCP: [facility]   Patient coming from:  The patient is coming from home.     Chief Complaint: Heart racing and syncope  HPI: Tricia Edwards is a 88 y.o. female with medical history significant of hypertension, hyperlipidemia, GERD, PAF on Eliquis, orthostatic hypotension on midodrine and Florinef, BPPV, CKD-3A, dCHF, iron deficiency anemia, trigeminal neuralgia who presents with heart racing and syncope.  Per her daughter, patient was found to be very tachycardic with heart racing this morning.  Her heart rate was 90-160s.  Daughter checked the patient's blood pressure which was 90/60.  Patient passed out shortly without fall or any injury.  Patient does not have headache or neck pain.  Daughter refused CT of head and neck.  Patient does not have chest pain, cough, shortness breath.  No nausea vomiting, diarrhea or abdominal pain.  No symptoms of UTI.  Patient does not have confusion, unilateral numbness or tinglings, no facial droop or slurred speech.  Patient was found to have atrial fibrillation with RVR with heart rate of 175 in ED, started Cardizem drip, and heart rate improved to 110s --> 60s currently.   Data reviewed independently and ED Course: pt was found to have WBC 7.6, BNP 554, troponin level 13, stable renal function, magnesium 2.1, potassium 3.5, blood pressure 149/99, saturation 100% on room air.  Chest x-ray showed bilateral mild basilar atelectasis.  Patient is placed on PCU for observation.   EKG: I have personally reviewed.  Atrial fibrillation, QTc 469, heart rate 166, anteroseptal infarction pattern   Review of Systems:   General: no fevers, chills, no body weight gain, has fatigue HEENT: no blurry vision, hearing changes or sore throat Respiratory: no dyspnea, coughing, wheezing CV: no chest pain, has palpitations GI: no nausea,  vomiting, abdominal pain, diarrhea, constipation GU: no dysuria, burning on urination, increased urinary frequency, hematuria  Ext: has leg edema Neuro: no unilateral weakness, numbness, or tingling, no vision change or hearing loss Skin: no rash, no skin tear. MSK: No muscle spasm, no deformity, no limitation of range of movement in spin Heme: No easy bruising.  Travel history: No recent long distant travel.   Allergy:  Allergies  Allergen Reactions   Pneumovax [Pneumococcal Polysaccharide Vaccine]     Other reaction(s): Severe arm swelling   Pneumococcal Vaccine     Other reaction(s): Unknown    Past Medical History:  Diagnosis Date   Hypertension    Paroxysmal A-fib ([place])     Past Surgical History:  Procedure Laterality Date   APPENDECTOMY     KNEE SURGERY      Social History:  reports that she has quit smoking. She has never used smokeless tobacco. She reports that she does not currently use alcohol. She reports that she does not currently use drugs.  Family History:  Family History  Problem Relation Age of Onset   Kidney cancer Sister    Intracerebral hemorrhage Brother      Prior to Admission medications   Medication Sig Start Date End Date Taking? Authorizing Provider  atorvastatin (LIPITOR) 10 MG tablet Take 10 mg by mouth daily. 12/11/21   [provider]  Calcium Carb-Cholecalciferol (CALCIUM 600/VITAMIN D PO) Take 1 tablet by mouth daily.    [provider]  ELIQUIS 5 MG TABS tablet Take 5 mg by mouth 2 (two) times daily. 12/18/21   [provider]  ferrous sulfate 324 MG TBEC Take 324 mg by mouth 3 (three) times a week. Take 1 tablet every morning, and 1 tablet in the evening every Wednesday and Saturday    [provider]  fludrocortisone (FLORINEF) 0.1 MG tablet Take 2 tablets (0.2 mg total) by mouth 2 (two) times daily. 03/15/22   Furth, Cadence H, PA-C  gabapentin (NEURONTIN) 300 MG capsule Take 300 mg by mouth 2 (two)  times daily. 12/12/21   [provider]  midodrine (PROAMATINE) 5 MG tablet Take 10 mg by mouth 3 (three) times daily with meals.    [provider]  omeprazole (PRILOSEC) 20 MG capsule Take 20 mg by mouth daily.    [provider]  phenytoin (DILANTIN) 100 MG ER capsule Take 200 mg by mouth 2 (two) times daily. Take 200 mg every other morning alternating with 100 mg, and 100 mg capsule daily in the evening 11/22/21   [provider]  senna (SENOKOT) 8.6 MG TABS tablet Take 1 tablet by mouth daily as needed for mild constipation.    [provider]    Physical Exam: Vitals:   05/22/22 1300 05/22/22 1330 05/22/22 1400 05/22/22 1430  BP: 118/70 118/75 124/64 127/74  Pulse: 71 72 (!) 140 (!) 107  Resp:  17 18 14   SpO2: 95% 92% 93% 95%  Weight:      Height:       General: Not in acute distress HEENT:       Eyes: PERRL, EOMI, no scleral icterus.       ENT: No discharge from the ears and nose, no pharynx injection, no tonsillar enlargement.        Neck: No JVD, no bruit, no mass felt. Heme: No neck lymph node enlargement. Cardiac: S1/S2, RRR, irregularly irregular rhythm, No gallops or rubs. Respiratory: No rales, wheezing, rhonchi or rubs. GI: Soft, nondistended, nontender, no rebound pain, no organomegaly, BS present. GU: No hematuria Ext: trace pitting leg edema bilaterally. 1+DP/PT pulse bilaterally. Musculoskeletal: No joint deformities, No joint redness or warmth, no limitation of ROM in spin. Skin: No rashes.  Neuro: Alert, oriented X3, cranial nerves II-XII grossly intact, moves all extremities normally.  Psych: Patient is not psychotic, no suicidal or hemocidal ideation.  Labs on Admission: I have personally reviewed following labs and imaging studies  CBC: Recent Labs  Lab 05/22/22 0806  WBC 7.6  NEUTROABS 2.8  HGB 11.3*  HCT 37.2  MCV 92.3  PLT [id number]   Basic Metabolic Panel: Recent Labs  Lab 05/22/22 0806  NA 143  K 3.5   CL 109  CO2 26  GLUCOSE 111*  BUN 17  CREATININE 1.06*  CALCIUM 8.8*  MG 2.1   GFR: Estimated Creatinine Clearance: 38.1 mL/min (A) (by C-G formula based on SCr of 1.06 mg/dL (H)). Liver Function Tests: No results for input(s): "AST", "ALT", "ALKPHOS", "BILITOT", "PROT", "ALBUMIN" in the last 168 hours. No results for input(s): "LIPASE", "AMYLASE" in the last 168 hours. No results for input(s): "AMMONIA" in the last 168 hours. Coagulation Profile: No results for input(s): "INR", "PROTIME" in the last 168 hours. Cardiac Enzymes: No results for input(s): "CKTOTAL", "CKMB", "CKMBINDEX", "TROPONINI" in the last 168 hours. BNP (last 3 results) No results for input(s): "PROBNP" in the last 8760 hours. HbA1C: No results for input(s): "HGBA1C" in the last 72 hours. CBG: No results for input(s): "GLUCAP" in the last 168 hours. Lipid Profile: No results for input(s): "CHOL", "HDL", "LDLCALC", "TRIG", "CHOLHDL", "LDLDIRECT" in the  last 72 hours. Thyroid Function Tests: No results for input(s): "TSH", "T4TOTAL", "FREET4", "T3FREE", "THYROIDAB" in the last 72 hours. Anemia Panel: No results for input(s): "VITAMINB12", "FOLATE", "FERRITIN", "TIBC", "IRON", "RETICCTPCT" in the last 72 hours. Urine analysis:    Component Value Date/Time   COLORURINE STRAW (A) 01/08/2022 1401   APPEARANCEUR HAZY (A) 01/08/2022 1401   LABSPEC 1.003 (L) 01/08/2022 1401   PHURINE 7.0 01/08/2022 1401   GLUCOSEU NEGATIVE 01/08/2022 1401   HGBUR NEGATIVE 01/08/2022 1401   BILIRUBINUR NEGATIVE 01/08/2022 1401   KETONESUR NEGATIVE 01/08/2022 1401   PROTEINUR NEGATIVE 01/08/2022 1401   NITRITE NEGATIVE 01/08/2022 1401   LEUKOCYTESUR NEGATIVE 01/08/2022 1401   Sepsis Labs: @LABRCNTIP (procalcitonin:4,lacticidven:4) )No results found for this or any previous visit (from the past 240 hour(s)).   Radiological Exams on Admission: DG Chest Portable 1 View  Result Date: 05/22/2022 CLINICAL DATA:  Shortness of  breath, atrial fibrillation, syncope EXAM: PORTABLE CHEST 1 VIEW COMPARISON:  Portable exam 0813 hours compared to 01/08/2022 FINDINGS: Minimal enlargement of cardiac silhouette. Atherosclerotic calcification aorta. Pulmonary vascularity normal. Minimal bibasilar atelectasis. No acute infiltrate, pleural effusion, or pneumothorax. External pacing leads project over chest. Bones demineralized. IMPRESSION: Minimal bibasilar atelectasis. Aortic Atherosclerosis (ICD10-I70.0). Electronically Signed   By: 01/10/2022 M.D.   On: 05/22/2022 08:20      Assessment/Plan Principal Problem:   Atrial fibrillation with RVR (HCC) Active Problems:   Syncope   Chronic orthostatic hypotension   Chronic diastolic congestive heart failure (HCC)   Chronic kidney disease, stage 3a (HCC)   Hyperlipidemia   Iron deficiency anemia   Trigeminal neuralgia   Assessment and Plan: * Atrial fibrillation with RVR (HCC) HR was up to 175, improving on Cardizem drip.  Denies chest pain or shortness breath currently  -Placed on PCU for observation -Continue Cardizem drip -Continue Eliquis  Syncope Most likely due to combination of atrial fibrillation with RVR and orthostatic hypotension -Check orthostatic vital sign -Frequent neurochecks -Continue home midodrine 10 mg 3 times daily and Florinef 0.2 mg twice a day  Chronic orthostatic hypotension -Continue home midodrine 10 mg 3 times daily and Florinef 0.2 mg twice a day  Chronic diastolic congestive heart failure (HCC) 2D echo on 01/18/2021 showed EF of 60-85% with grade 1 diastolic dysfunction.  BNP is elevated at 554, but denies shortness of breath.  No oxygen desaturation.  No pulm edema on chest x-ray.  Patient does not seem to have CHF exacerbation, but the patient is at risk of developing hypotension. -Will not start diuretics since patient had syncope and orthostatic hypotension may have contributed partially  Chronic kidney disease, stage 3a  (HCC) Stable -Follow-up with BMP  Hyperlipidemia - Lipitor  Iron deficiency anemia Hemoglobin stable 11.3 - Continue iron supplement  Trigeminal neuralgia -Continue Dilantin          DVT ppx: on Eliquis  Code Status: DNR per pt and her daughter  Family Communication: n    Yes, patient's daughter   at bed side.    Disposition Plan:  Anticipate discharge back to previous environment  Consults called:  none  Admission status and Level of care: Progressive:   for obs   Severity of Illness:  The appropriate patient status for this patient is OBSERVATION. Observation status is judged to be reasonable and necessary in order to provide the required intensity of service to ensure the patient's safety. The patient's presenting symptoms, physical exam findings, and initial radiographic and laboratory data in the context of their medical  condition is felt to place them at decreased risk for further clinical deterioration. Furthermore, it is anticipated that the patient will be medically stable for discharge from the hospital within 2 midnights of admission.        Date of Service 05/22/2022    Lorretta Harp [facility]   If 7PM-7AM, please contact night-coverage [email or web address] 05/22/2022, 2:41 PM

## 2022-05-22 NOTE — ED Triage Notes (Addendum)
Pt from home via POV, family reports that pt is in AFIB. Pt arrives to ED WR with AMS. Per daughter pt got up this am and checked her VS, HR was fluctuating from 60-150's. Pt states that she passed out this am.

## 2022-05-22 NOTE — ED Notes (Signed)
ED TO INPATIENT HANDOFF REPORT  ED Nurse Name and Phone #: Baxter Flattery, RN  S Name/Age/Gender Tricia Edwards 88 y.o. female Room/Bed: ED15A/ED15A  Code Status   Code Status: DNR  Home/SNF/Other Home Patient oriented to: self, place, time, and situation Is this baseline? Yes   Triage Complete: Triage complete  Chief Complaint Atrial fibrillation with RVR ([place]) [I48.91]  Triage Note Pt from home via POV, family reports that pt is in AFIB. Pt arrives to ED WR with AMS. Per daughter pt got up this am and checked her VS, HR was fluctuating from 60-150's. Pt states that she passed out this am.    Allergies Allergies  Allergen Reactions   Pneumovax [Pneumococcal Polysaccharide Vaccine]     Other reaction(s): Severe arm swelling   Pneumococcal Vaccine     Other reaction(s): Unknown    Level of Care/Admitting Diagnosis ED Disposition     ED Disposition  Admit   Condition  --   Comment  Hospital Area: [place] [[id number]]  Level of Care: Progressive [102]  Admit to Progressive based on following criteria: CARDIOVASCULAR & THORACIC of moderate stability with acute coronary syndrome symptoms/low risk myocardial infarction/hypertensive urgency/arrhythmias/heart failure potentially compromising stability and stable post cardiovascular intervention patients.  Covid Evaluation: Asymptomatic - no recent exposure (last 10 days) testing not required  Diagnosis: Atrial fibrillation with RVR [facility]) LP:[id number]  Admitting Physician: Ivor Costa [[id number]]  Attending Physician: Ivor Costa [[id number]]          B Medical/Surgery History Past Medical History:  Diagnosis Date   Hypertension    Paroxysmal A-fib ([place])    Past Surgical History:  Procedure Laterality Date   APPENDECTOMY     KNEE SURGERY       A IV Location/Drains/Wounds Patient Lines/Drains/Airways Status     Active Line/Drains/Airways     Name Placement date Placement time Site Days   Peripheral IV  05/22/22 20 G Left Antecubital 05/22/22  0810  Antecubital  less than 1            Intake/Output Last 24 hours No intake or output data in the 24 hours ending 05/22/22 2103  Labs/Imaging Results for orders placed or performed during the hospital encounter of 05/22/22 (from the past 48 hour(s))  CBC with Differential     Status: Abnormal   Collection Time: 05/22/22  8:06 AM  Result Value Ref Range   WBC 7.6 4.0 - 10.5 K/uL   RBC 4.03 3.87 - 5.11 MIL/uL   Hemoglobin 11.3 (L) 12.0 - 15.0 g/dL   HCT 37.2 36.0 - 46.0 %   MCV 92.3 80.0 - 100.0 fL   MCH 28.0 26.0 - 34.0 pg   MCHC 30.4 30.0 - 36.0 g/dL   RDW 14.6 11.5 - 15.5 %   Platelets 240 150 - 400 K/uL   nRBC 0.0 0.0 - 0.2 %   Neutrophils Relative % 37 %   Neutro Abs 2.8 1.7 - 7.7 K/uL   Lymphocytes Relative 47 %   Lymphs Abs 3.6 0.7 - 4.0 K/uL   Monocytes Relative 12 %   Monocytes Absolute 0.9 0.1 - 1.0 K/uL   Eosinophils Relative 3 %   Eosinophils Absolute 0.2 0.0 - 0.5 K/uL   Basophils Relative 1 %   Basophils Absolute 0.1 0.0 - 0.1 K/uL   Immature Granulocytes 0 %   Abs Immature Granulocytes 0.02 0.00 - 0.07 K/uL    Comment: Performed at [facility], [address], [place], [place] [address]  Basic metabolic panel     Status: Abnormal   Collection Time: 05/22/22  8:06 AM  Result Value Ref Range   Sodium 143 135 - 145 mmol/L   Potassium 3.5 3.5 - 5.1 mmol/L   Chloride 109 98 - 111 mmol/L   CO2 26 22 - 32 mmol/L   Glucose, Bld 111 (H) 70 - 99 mg/dL    Comment: Glucose reference range applies only to samples taken after fasting for at least 8 hours.   BUN 17 8 - 23 mg/dL   Creatinine, Ser 0.16 (H) 0.44 - 1.00 mg/dL   Calcium 8.8 (L) 8.9 - 10.3 mg/dL   GFR, Estimated 50 (L) >60 mL/min    Comment: (NOTE) Calculated using the CKD-EPI Creatinine Equation (2021)    Anion gap 8 5 - 15    Comment: Performed at [facility], [address] [place], [place] [address]  Troponin I (High Sensitivity)      Status: None   Collection Time: 05/22/22  8:06 AM  Result Value Ref Range   Troponin I (High Sensitivity) 13 <18 ng/L    Comment: (NOTE) Elevated high sensitivity troponin I (hsTnI) values and significant  changes across serial measurements may suggest ACS but many other  chronic and acute conditions are known to elevate hsTnI results.  Refer to the "Links" section for chest pain algorithms and additional  guidance. Performed at [facility], [address] [place], [place] [address]   Magnesium     Status: None   Collection Time: 05/22/22  8:06 AM  Result Value Ref Range   Magnesium 2.1 1.7 - 2.4 mg/dL    Comment: Performed at [facility], [address] [place], [place] [address]  Brain natriuretic peptide     Status: Abnormal   Collection Time: 05/22/22  8:06 AM  Result Value Ref Range   B Natriuretic Peptide 554.0 (H) 0.0 - 100.0 pg/mL    Comment: Performed at [facility], [address] [place], [place] [address]  Urinalysis, Routine w reflex microscopic     Status: Abnormal   Collection Time: 05/22/22  5:00 PM  Result Value Ref Range   Color, Urine STRAW (A) YELLOW   APPearance CLEAR (A) CLEAR   Specific Gravity, Urine 1.005 1.005 - 1.030   pH 7.0 5.0 - 8.0   Glucose, UA NEGATIVE NEGATIVE mg/dL   Hgb urine dipstick NEGATIVE NEGATIVE   Bilirubin Urine NEGATIVE NEGATIVE   Ketones, ur NEGATIVE NEGATIVE mg/dL   Protein, ur NEGATIVE NEGATIVE mg/dL   Nitrite NEGATIVE NEGATIVE   Leukocytes,Ua NEGATIVE NEGATIVE   RBC / HPF 0-5 0 - 5 RBC/hpf   WBC, UA 0-5 0 - 5 WBC/hpf   Bacteria, UA NONE SEEN NONE SEEN   Squamous Epithelial / LPF 0-5 0 - 5    Comment: Performed at [facility], [address] [place], [place] [address]   DG Chest Portable 1 View  Result Date: 05/22/2022 CLINICAL DATA:  Shortness of breath, atrial fibrillation, syncope EXAM: PORTABLE CHEST 1 VIEW COMPARISON:  Portable exam 0813 hours compared to 01/08/2022  FINDINGS: Minimal enlargement of cardiac silhouette. Atherosclerotic calcification aorta. Pulmonary vascularity normal. Minimal bibasilar atelectasis. No acute infiltrate, pleural effusion, or pneumothorax. External pacing leads project over chest. Bones demineralized. IMPRESSION: Minimal bibasilar atelectasis. Aortic Atherosclerosis (ICD10-I70.0). Electronically Signed   By: Ulyses Southward M.D.   On: 05/22/2022 08:20    Pending Labs Unresulted Labs (From admission, onward)    None  Vitals/Pain Today's Vitals   05/22/22 1930 05/22/22 2000 05/22/22 2030 05/22/22 2100  BP: 116/63  134/80 (!) 141/84  Pulse: 64  (!) 48 74  Resp: 16 (!) 23 12 20   SpO2: 99%  94% 98%  Weight:      Height:      PainSc:        Isolation Precautions No active isolations  Medications Medications  diltiazem (CARDIZEM) 125 mg in dextrose 5% 125 mL (1 mg/mL) infusion (7.5 mg/hr Intravenous Rate/Dose Change 05/22/22 1520)  ondansetron (ZOFRAN) injection 4 mg (has no administration in time range)  hydrALAZINE (APRESOLINE) injection 5 mg (has no administration in time range)  acetaminophen (TYLENOL) tablet 650 mg (has no administration in time range)  atorvastatin (LIPITOR) tablet 10 mg (10 mg Oral Given 05/22/22 1355)  midodrine (PROAMATINE) tablet 10 mg (10 mg Oral Given 05/22/22 1518)  fludrocortisone (FLORINEF) tablet 0.2 mg (has no administration in time range)  pantoprazole (PROTONIX) EC tablet 40 mg (40 mg Oral Given 05/22/22 1355)  senna (SENOKOT) tablet 8.6 mg (has no administration in time range)  apixaban (ELIQUIS) tablet 5 mg (5 mg Oral Given 05/22/22 1355)  ferrous sulfate tablet 324 mg (has no administration in time range)  gabapentin (NEURONTIN) capsule 300 mg (has no administration in time range)  calcium-vitamin D (OSCAL WITH D) 500-5 MG-MCG per tablet 1 tablet (1 tablet Oral Given 05/22/22 1355)  phenytoin (DILANTIN) ER capsule 100 mg (100 mg Oral Given 05/22/22 1357)  phenytoin (DILANTIN) ER capsule  100 mg (has no administration in time range)  phenytoin (DILANTIN) ER capsule 200 mg (has no administration in time range)  diltiazem (CARDIZEM) injection 10 mg (10 mg Intravenous Given 05/22/22 0813)  midodrine (PROAMATINE) tablet 10 mg (10 mg Oral Given 05/22/22 0834)  fludrocortisone (FLORINEF) tablet 0.2 mg (0.2 mg Oral Given 05/22/22 0834)  potassium chloride SA (KLOR-CON M) CR tablet 40 mEq (40 mEq Oral Given 05/22/22 1101)    Mobility walks with device Low fall risk   Focused Assessments Cardiac Assessment Handoff:    No results found for: "CKTOTAL", "CKMB", "CKMBINDEX", "TROPONINI" No results found for: "DDIMER" Does the Patient currently have chest pain? No    R Recommendations: See Admitting Provider Note  Report given to:   Additional Notes:

## 2022-05-22 NOTE — Assessment & Plan Note (Signed)
Continue Dilantin 

## 2022-05-22 NOTE — ED Provider Notes (Signed)
[facility] Provider Note    Event Date/Time   First MD Initiated Contact with Patient 05/22/22 [id number]     (approximate)   History   Chief Complaint Atrial Fibrillation   HPI  Tricia Edwards is a 88 y.o. female with past medical history of hyperlipidemia, paroxysmal atrial fibrillation on Eliquis, orthostatic hypotension, diastolic CHF, and CKD who presents to the ED complaining of atrial fibrillation.  Daughter, who lives with the patient and is her primary caretaker, states that she woke the patient up this morning to give her her medicines and check her vital signs.  At that time, she found the patient was very tachycardic with heart rates varying from 90-160.  She was also concerned that patient's blood pressure was as low as 90/60.  Daughter states that patient seemed like she was going to pass out, patient has a long history of orthostatic hypotension and associated syncopal episodes, takes midodrine and Florinef for this.  She has not yet taken her midodrine or Florinef this morning, is awake and alert upon being brought back to a room and denies any chest pain or shortness of breath.  Daughter states that she has otherwise been well recently with no fevers, cough, nausea, vomiting, abdominal pain, or dysuria.     Physical Exam   Triage Vital Signs: ED Triage Vitals  Enc Vitals Group     BP 05/22/22 0805 (!) 172/137     Pulse Rate 05/22/22 0805 (!) 175     Resp 05/22/22 0805 (!) 26     Temp --      Temp src --      SpO2 05/22/22 0805 100 %     Weight 05/22/22 0807 165 lb 5.5 oz (75 kg)     Height 05/22/22 0807 5\' 7"  (1.702 m)     Head Circumference --      Peak Flow --      Pain Score 05/22/22 0806 0     Pain Loc --      Pain Edu? --      Excl. in GC? --     Most recent vital signs: Vitals:   05/22/22 0816 05/22/22 0900  BP: (!) 149/99 115/84  Pulse: (!) 119 69  Resp: 20 16  SpO2: 99% 94%    Constitutional: Alert and oriented. Eyes:  Conjunctivae are normal. Head: Atraumatic. Nose: No congestion/rhinnorhea. Mouth/Throat: Mucous membranes are moist.  Cardiovascular: Tachycardic, irregularly irregular rhythm. Grossly normal heart sounds.  2+ radial pulses bilaterally. Respiratory: Normal respiratory effort.  No retractions. Lungs CTAB. Gastrointestinal: Soft and nontender. No distention. Musculoskeletal: No lower extremity tenderness nor edema.  Neurologic:  Normal speech and language. No gross focal neurologic deficits are appreciated.    ED Results / Procedures / Treatments   Labs (all labs ordered are listed, but only abnormal results are displayed) Labs Reviewed  CBC WITH DIFFERENTIAL/PLATELET - Abnormal; Notable for the following components:      Result Value   Hemoglobin 11.3 (*)    All other components within normal limits  BASIC METABOLIC PANEL - Abnormal; Notable for the following components:   Glucose, Bld 111 (*)    Creatinine, Ser 1.06 (*)    Calcium 8.8 (*)    GFR, Estimated 50 (*)    All other components within normal limits  MAGNESIUM  URINALYSIS, ROUTINE W REFLEX MICROSCOPIC  BRAIN NATRIURETIC PEPTIDE  TROPONIN I (HIGH SENSITIVITY)     EKG  ED ECG REPORT I, 07/23/22, the  attending physician, personally viewed and interpreted this ECG.   Date: 05/22/2022  EKG Time: 8:04  Rate: 166  Rhythm: atrial fibrillation  Axis: Normal  Intervals:none  ST&T Change: ST elevation aVR with diffuse ST depressions  RADIOLOGY Chest x-ray reviewed and interpreted by me with no infiltrate, edema, or effusion.  PROCEDURES:  Critical Care performed: Yes, see critical care procedure note(s)  .Critical Care  Performed by: [name], MD Authorized by: [name], MD   Critical care provider statement:    Critical care time (minutes):  30   Critical care time was exclusive of:  Separately billable procedures and treating other patients and teaching time   Critical care was necessary  to treat or prevent imminent or life-threatening deterioration of the following conditions:  Cardiac failure   Critical care was time spent personally by me on the following activities:  Development of treatment plan with patient or surrogate, discussions with consultants, evaluation of patient's response to treatment, examination of patient, ordering and review of laboratory studies, ordering and review of radiographic studies, ordering and performing treatments and interventions, pulse oximetry, re-evaluation of patient's condition and review of old charts   I assumed direction of critical care for this patient from another provider in my specialty: no     Care discussed with: admitting provider      MEDICATIONS ORDERED IN ED: Medications  diltiazem (CARDIZEM) 125 mg in dextrose 5% 125 mL (1 mg/mL) infusion (5 mg/hr Intravenous New Bag/Given 05/22/22 0821)  ondansetron (ZOFRAN) injection 4 mg (has no administration in time range)  hydrALAZINE (APRESOLINE) injection 5 mg (has no administration in time range)  acetaminophen (TYLENOL) tablet 650 mg (has no administration in time range)  potassium chloride SA (KLOR-CON M) CR tablet 40 mEq (has no administration in time range)  diltiazem (CARDIZEM) injection 10 mg (10 mg Intravenous Given 05/22/22 0813)  midodrine (PROAMATINE) tablet 10 mg (10 mg Oral Given 05/22/22 0834)  fludrocortisone (FLORINEF) tablet 0.2 mg (0.2 mg Oral Given 05/22/22 0834)     IMPRESSION / MDM / ASSESSMENT AND PLAN / ED COURSE  I reviewed the triage vital signs and the nursing notes.                              88 y.o. female with past medical history of hyperlipidemia, atrial fibrillation on Eliquis, orthostatic hypotension, diastolic CHF, and CKD who presents to the ED complaining of elevated heart rate and low blood pressure this morning with near syncopal episode.  Patient's presentation is most consistent with acute presentation with potential threat to life or bodily  function.  Differential diagnosis includes, but is not limited to, atrial fibrillation, ACS, pneumonia, UTI, electrolyte abnormality, CHF.  Patient well-appearing and in no acute distress on my assessment, vital signs remarkable for tachycardia with irregular rhythm, however blood pressure stable at this time.  EKG consistent with atrial fibrillation with RVR, associated rate related ischemic changes noted, however patient denies chest pain or shortness of breath at this time and I doubt ACS or PE.  We will attempt to control rate with IV diltiazem, further assess with labs and chest x-ray.  Patient is due for her usual dose of midodrine and Florinef, which we will give so that her pressure does not drop with diltiazem administration.  Heart rate improved on diltiazem drip and patient reports she is feeling better with no further feelings of near syncope.  Labs are reassuring with no  significant anemia, leukocytosis, or electrolyte abnormality, mild AKI noted.  Troponin within normal limits and I doubt ACS, no evidence of infectious process at this time.  Case discussed with hospitalist for admission for further management of atrial fibrillation with RVR.      FINAL CLINICAL IMPRESSION(S) / ED DIAGNOSES   Final diagnoses:  Atrial fibrillation with RVR (HCC)     Rx / DC Orders   ED Discharge Orders     None        Note:  This document was prepared using Dragon voice recognition software and may include unintentional dictation errors.   [name], MD 05/22/22 [phone]

## 2022-05-23 DIAGNOSIS — N1831 Chronic kidney disease, stage 3a: Secondary | ICD-10-CM | POA: Diagnosis not present

## 2022-05-23 DIAGNOSIS — I5033 Acute on chronic diastolic (congestive) heart failure: Secondary | ICD-10-CM

## 2022-05-23 DIAGNOSIS — I4891 Unspecified atrial fibrillation: Secondary | ICD-10-CM | POA: Diagnosis not present

## 2022-05-23 DIAGNOSIS — I951 Orthostatic hypotension: Secondary | ICD-10-CM | POA: Diagnosis not present

## 2022-05-23 MED ORDER — FUROSEMIDE 10 MG/ML IJ SOLN
20.0000 mg | Freq: Two times a day (BID) | INTRAMUSCULAR | Status: DC
  Administered 2022-05-23: 20 mg via INTRAVENOUS
  Filled 2022-05-23: qty 2

## 2022-05-23 MED ORDER — ORAL CARE MOUTH RINSE: 15.0000 mL | OROMUCOSAL | Status: DC | PRN

## 2022-05-23 NOTE — Progress Notes (Signed)
Completed assessment on patient and assisted to [facility]  Patient became very dizzy when attempting to get back to bed. Was able to assist patient back to bed with 2 assist. BP 90/61, HR 86.

## 2022-05-23 NOTE — Assessment & Plan Note (Signed)
Unclear if she had some volume overload prior to coming in.  She certainly received fluids upon arrival to the emergency room.  BNP elevated in the 500s.  Previous echocardiogram done last year noted grade 1 diastolic dysfunction.  Initially did not receive Lasix for concerns of orthostasis, but prior to discharge give 1 dose of Lasix 20 mg and patient diuresed 1.5 L.

## 2022-05-23 NOTE — Discharge Summary (Signed)
Physician Discharge Summary   Patient: Tricia Edwards MRN: [id number] DOB: 10/12/1930  Admit date:     05/22/2022  Discharge date: 05/23/22  Discharge Physician: Hollice Espy   PCP: [facility]   Recommendations at discharge:   Patient will follow-up with cardiology as scheduled  Discharge Diagnoses: Principal Problem:   Atrial fibrillation with RVR (HCC) Active Problems:   Syncope   Chronic orthostatic hypotension   Acute on chronic diastolic CHF (congestive heart failure) (HCC)   Chronic kidney disease, stage 3a (HCC)   Hyperlipidemia   Iron deficiency anemia   Trigeminal neuralgia  Resolved Problems:   * No resolved hospital problems. *  Hospital Course: 88 year old female with past medical history of paroxysmal atrial fibrillation on Eliquis, orthostatic hypotension on midodrine and Florinef, stage IIIa chronic kidney disease, diastolic CHF, hypertension and BPV who presented to the emergency room on 7/5 with complaints of heart racing and a syncopal event.  As per patient's daughter, patient was having very fast heart rate ranged from 90s to 160s and blood pressure was 90/60 and then she passed out shortly after.  In the emergency room, patient found to be in atrial fibrillation with RVR and started on Cardizem drip.  Lab work noteworthy for BNP of 554.  Assessment and Plan: * Atrial fibrillation with RVR (HCC) Already on Eliquis.  She may have been slightly volume overloaded which contributed increased heart rate and then took her midodrine and Florinef late in the day versus at noon and had a little more hypotensive all of which contributed to her becoming more tachycardic and then went into rapid A-fib which dropped her blood pressure further and caused syncope.  Initially on Cardizem drip and able to be weaned off without issue on afternoon of 7/6.  I recommended consideration of patient wearing an Apple Watch which would allow to monitor for episodes of  tachycardia and then can even check for atrial fibrillation.  Syncope Most likely due to combination of atrial fibrillation with RVR and orthostatic hypotension.  As above.  Patient took her home midodrine and Florinef late in the afternoon versus at 12 noon.  She did not get an evening dose.  Acute on chronic diastolic CHF (congestive heart failure) (HCC) Unclear if she had some volume overload prior to coming in.  She certainly received fluids upon arrival to the emergency room.  BNP elevated in the 500s.  Previous echocardiogram done last year noted grade 1 diastolic dysfunction.  Initially did not receive Lasix for concerns of orthostasis, but prior to discharge give 1 dose of Lasix 20 mg and patient diuresed 1.5 L.  Chronic orthostatic hypotension -Continue home midodrine 10 mg 3 times daily and Florinef 0.2 mg twice a day  Chronic kidney disease, stage 3a (HCC) Stable -Follow-up with BMP  Hyperlipidemia - Lipitor  Iron deficiency anemia Hemoglobin stable 11.3 - Continue iron supplement  Trigeminal neuralgia -Continue Dilantin         Consultants: None Procedures performed: None Disposition: Home Diet recommendation:  Discharge Diet Orders (From admission, onward)     Start     Ordered   05/23/22 0000  Diet - low sodium heart healthy        05/23/22 1545           Cardiac diet DISCHARGE MEDICATION: Allergies as of 05/23/2022       Reactions   Pneumovax [pneumococcal Polysaccharide Vaccine]    Other reaction(s): Severe arm swelling   Pneumococcal Vaccine  Other reaction(s): Unknown        Medication List     TAKE these medications    atorvastatin 10 MG tablet Commonly known as: LIPITOR Take 10 mg by mouth daily.   CALCIUM 600/VITAMIN D PO Take 1 tablet by mouth in the morning and at bedtime.   Eliquis 5 MG Tabs tablet Generic drug: apixaban Take 5 mg by mouth 2 (two) times daily.   ferrous sulfate 324 MG Tbec Take 324 mg by mouth 3  (three) times a week. Take 1 tablet every morning, and 1 tablet in the evening every Wednesday and Saturday   fludrocortisone 0.1 MG tablet Commonly known as: FLORINEF Take 2 tablets (0.2 mg total) by mouth 2 (two) times daily.   gabapentin 300 MG capsule Commonly known as: NEURONTIN Take 300 mg by mouth 2 (two) times daily.   midodrine 10 MG tablet Commonly known as: PROAMATINE Take 10 mg by mouth 3 (three) times daily.   omeprazole 20 MG capsule Commonly known as: PRILOSEC Take 20 mg by mouth daily.   phenytoin 100 MG ER capsule Commonly known as: DILANTIN Take 100-200 mg by mouth 2 (two) times daily. Take 200 mg every other morning alternating with 100 mg, and 100 mg capsule daily in the evening   senna 8.6 MG Tabs tablet Commonly known as: SENOKOT Take 1 tablet by mouth daily as needed for mild constipation.        Discharge Exam: Filed Weights   05/22/22 0807 05/22/22 2147 05/23/22 0500  Weight: 75 kg 74.4 kg 74.4 kg   General: Alert and oriented x3, no acute distress Cardiovascular: Regular rate and rhythm currently, S1-S2 Lungs: Clear to auscultation bilaterally  Condition at discharge: good  The results of significant diagnostics from this hospitalization (including imaging, microbiology, ancillary and laboratory) are listed below for reference.   Imaging Studies: DG Chest Portable 1 View  Result Date: 05/22/2022 CLINICAL DATA:  Shortness of breath, atrial fibrillation, syncope EXAM: PORTABLE CHEST 1 VIEW COMPARISON:  Portable exam 0813 hours compared to 01/08/2022 FINDINGS: Minimal enlargement of cardiac silhouette. Atherosclerotic calcification aorta. Pulmonary vascularity normal. Minimal bibasilar atelectasis. No acute infiltrate, pleural effusion, or pneumothorax. External pacing leads project over chest. Bones demineralized. IMPRESSION: Minimal bibasilar atelectasis. Aortic Atherosclerosis (ICD10-I70.0). Electronically Signed   By: Ulyses Southward M.D.   On:  05/22/2022 08:20    Microbiology: Results for orders placed or performed during the hospital encounter of 01/08/22  Resp Panel by RT-PCR (Flu A&B, Covid) Nasopharyngeal Swab     Status: None   Collection Time: 01/08/22  2:01 PM   Specimen: Nasopharyngeal Swab; Nasopharyngeal(NP) swabs in vial transport medium  Result Value Ref Range Status   SARS Coronavirus 2 by RT PCR NEGATIVE NEGATIVE Final    Comment: (NOTE) SARS-CoV-2 target nucleic acids are NOT DETECTED.  The SARS-CoV-2 RNA is generally detectable in upper respiratory specimens during the acute phase of infection. The lowest concentration of SARS-CoV-2 viral copies this assay can detect is 138 copies/mL. A negative result does not preclude SARS-Cov-2 infection and should not be used as the sole basis for treatment or other patient management decisions. A negative result may occur with  improper specimen collection/handling, submission of specimen other than nasopharyngeal swab, presence of viral mutation(s) within the areas targeted by this assay, and inadequate number of viral copies(<138 copies/mL). A negative result must be combined with clinical observations, patient history, and epidemiological information. The expected result is Negative.  Fact Sheet for Patients:  BloggerCourse.com  Fact Sheet for Healthcare Providers:  IncredibleEmployment.be  This test is no t yet approved or cleared by the [place] FDA and  has been authorized for detection and/or diagnosis of SARS-CoV-2 by FDA under an Emergency Use Authorization (EUA). This EUA will remain  in effect (meaning this test can be used) for the duration of the COVID-19 declaration under Section 564(b)(1) of the Act, 21 U.S.C.section 360bbb-3(b)(1), unless the authorization is terminated  or revoked sooner.       Influenza A by PCR NEGATIVE NEGATIVE Final   Influenza B by PCR NEGATIVE NEGATIVE Final    Comment:  (NOTE) The Xpert Xpress SARS-CoV-2/FLU/RSV plus assay is intended as an aid in the diagnosis of influenza from Nasopharyngeal swab specimens and should not be used as a sole basis for treatment. Nasal washings and aspirates are unacceptable for Xpert Xpress SARS-CoV-2/FLU/RSV testing.  Fact Sheet for Patients: EntrepreneurPulse.com.au  Fact Sheet for Healthcare Providers: IncredibleEmployment.be  This test is not yet approved or cleared by the [place] FDA and has been authorized for detection and/or diagnosis of SARS-CoV-2 by FDA under an Emergency Use Authorization (EUA). This EUA will remain in effect (meaning this test can be used) for the duration of the COVID-19 declaration under Section 564(b)(1) of the Act, 21 U.S.C. section 360bbb-3(b)(1), unless the authorization is terminated or revoked.  Performed at [facility], [address] [place], [place] [address]   Blood culture (routine x 2)     Status: None   Collection Time: 01/08/22  2:01 PM   Specimen: BLOOD  Result Value Ref Range Status   Specimen Description BLOOD LAC  Final   Special Requests BOTTLES DRAWN AEROBIC AND ANAEROBIC BCAV  Final   Culture   Final    NO GROWTH 5 DAYS Performed at [facility], [address] [place], [place] [address]    Report Status 01/13/2022 FINAL  Final  Blood culture (routine x 2)     Status: None   Collection Time: 01/08/22  2:01 PM   Specimen: BLOOD  Result Value Ref Range Status   Specimen Description BLOOD RAC  Final   Special Requests BOTTLES DRAWN AEROBIC AND ANAEROBIC BCAV  Final   Culture   Final    NO GROWTH 5 DAYS Performed at [facility], [address], [place], [place] [address]    Report Status 01/13/2022 FINAL  Final    Labs: CBC: Recent Labs  Lab 05/22/22 0806  WBC 7.6  NEUTROABS 2.8  HGB 11.3*  HCT 37.2  MCV 92.3  PLT [id number]   Basic Metabolic Panel: Recent Labs  Lab  05/22/22 0806  NA 143  K 3.5  CL 109  CO2 26  GLUCOSE 111*  BUN 17  CREATININE 1.06*  CALCIUM 8.8*  MG 2.1   Liver Function Tests: No results for input(s): "AST", "ALT", "ALKPHOS", "BILITOT", "PROT", "ALBUMIN" in the last 168 hours. CBG: No results for input(s): "GLUCAP" in the last 168 hours.  Discharge time spent: less than 30 minutes.  Signed: Annita Brod, MD [facility] 05/23/2022

## 2022-05-23 NOTE — Progress Notes (Signed)
PIV removed. Discharge instructions completed. Patient's daughter verbalized understanding of medication regimen, follow up appointments and discharge instructions. Patient belongings gathered and packed to discharge.

## 2022-05-23 NOTE — TOC Initial Note (Signed)
Transition of Care [facility]) - Initial/Assessment Note    Patient Details  Name: Tricia Edwards MRN: [id number] Date of Birth: 1930/05/28  [facility]) CM/SW Contact:    Truddie Hidden, RN Phone Number: 05/23/2022, 3:13 PM  Clinical Narrative:                   Transition of Care [facility]) Screening Note   Patient Details  Name: Tricia Edwards Date of Birth: Jul 24, 1930   Transition of Care [facility]) CM/SW Contact:    Truddie Hidden, RN Phone Number: 05/23/2022, 3:13 PM    Transition of Care Department [facility]) has reviewed patient and no TOC needs have been identified at this time. We will continue to monitor patient advancement through interdisciplinary progression rounds. If new patient transition needs arise, please place a TOC consult.         Patient Goals and CMS Choice        Expected Discharge Plan and Services                                                Prior Living Arrangements/Services                       Activities of Daily Living Home Assistive Devices/Equipment: Blood pressure cuff, Cane (specify quad or straight), Walker (specify type), Wheelchair ADL Screening (condition at time of admission) Patient's cognitive ability adequate to safely complete daily activities?: Yes Is the patient deaf or have difficulty hearing?: No Does the patient have difficulty seeing, even when wearing glasses/contacts?: No Does the patient have difficulty concentrating, remembering, or making decisions?: No Patient able to express need for assistance with ADLs?: Yes Does the patient have difficulty dressing or bathing?: No Independently performs ADLs?: Yes (appropriate for developmental age) Does the patient have difficulty walking or climbing stairs?: No Weakness of Legs: None Weakness of Arms/Hands: None  Permission Sought/Granted                  Emotional Assessment              Admission diagnosis:  Atrial fibrillation  with RVR (HCC) [I48.91] Patient Active Problem List   Diagnosis Date Noted   Atrial fibrillation with RVR (HCC) 05/22/2022   Chronic kidney disease, stage 3a (HCC) 05/22/2022   Iron deficiency anemia 05/22/2022   Syncope 05/22/2022   Elevated troponin    UTI (urinary tract infection) 01/08/2022   Hepatic steatosis 12/04/2021   Chronic idiopathic constipation 08/21/2021   Chronic diastolic congestive heart failure (HCC) 05/25/2021   Paroxysmal atrial fibrillation (HCC) 05/25/2021   Hypokalemia 01/18/2021   Chronic orthostatic hypotension 01/18/2021   History of trigeminal neuralgia 01/18/2021   Dizziness 08/08/2020   Osteoporosis 12/30/2018   Vitamin D deficiency 12/22/2018   Syncope and collapse 03/15/2018   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/09/2018   Unspecified abdominal hernia without obstruction or gangrene 05/05/2017   Gastro-esophageal reflux disease with esophagitis 03/10/2017   Anemia, unspecified 02/06/2017   Macular degeneration, dry 01/01/2017   Pseudophakia of right eye 01/01/2017   Vitamin B12 deficiency 01/01/2017   Hyponatremia 08/22/2016   Trigeminal neuralgia 03/24/2016   Radiculopathy, cervical region 03/24/2016   Atherosclerotic heart disease of native coronary artery without angina pectoris 11/08/2015   Stage 3 chronic kidney disease (HCC) 10/29/2013   Spondylosis without myelopathy or  radiculopathy, site unspecified 06/28/2013   Nuclear senile cataract 01/07/2013   Obesity 08/09/2010   Benign paroxysmal positional vertigo 10/18/2008   Other dorsalgia 12/04/2005   Hyperlipidemia 09/23/2004   PCP:  [facility] Pharmacy:   [facility] [address] [place], [place] - [address] [place] [address] Phone: [phone] Fax: [phone]     Social Determinants of Health (SDOH) Interventions    Readmission Risk Interventions     No data to display

## 2022-05-23 NOTE — Hospital Course (Signed)
88 year old female with past medical history of paroxysmal atrial fibrillation on Eliquis, orthostatic hypotension on midodrine and Florinef, stage IIIa chronic kidney disease, diastolic CHF, hypertension and BPV who presented to the emergency room on 7/5 with complaints of heart racing and a syncopal event.  As per patient's daughter, patient was having very fast heart rate ranged from 90s to 160s and blood pressure was 90/60 and then she passed out shortly after.  In the emergency room, patient found to be in atrial fibrillation with RVR and started on Cardizem drip.  Lab work noteworthy for BNP of 554.

## 2022-08-10 ENCOUNTER — Other Ambulatory Visit: Payer: Self-pay | Admitting: Medical

## 2022-09-08 ENCOUNTER — Emergency Department
Admission: EM | Admit: 2022-09-08 | Discharge: 2022-09-08 | Disposition: A | Discharge status: Home / Self Care | Payer: Medicare Other | Attending: Emergency Medicine | Admitting: Emergency Medicine

## 2022-09-08 ENCOUNTER — Other Ambulatory Visit: Payer: Self-pay

## 2022-09-08 ENCOUNTER — Emergency Department: Payer: Medicare Other

## 2022-09-08 DIAGNOSIS — I4891 Unspecified atrial fibrillation: Secondary | ICD-10-CM | POA: Diagnosis not present

## 2022-09-08 DIAGNOSIS — R42 Dizziness and giddiness: Secondary | ICD-10-CM | POA: Diagnosis present

## 2022-09-08 LAB — BASIC METABOLIC PANEL
Anion gap: 8 (ref 5–15)
BUN: 16 mg/dL (ref 8–23)
CO2: 28 mmol/L (ref 22–32)
Calcium: 8.8 mg/dL — ABNORMAL LOW (ref 8.9–10.3)
Chloride: 107 mmol/L (ref 98–111)
Creatinine, Ser: 0.9 mg/dL (ref 0.44–1.00)
GFR, Estimated: >60 mL/min (ref >60)
Glucose, Bld: 88 mg/dL (ref 70–99)
Potassium: 4 mmol/L (ref 3.5–5.1)
Sodium: 143 mmol/L (ref 135–145)

## 2022-09-08 LAB — TROPONIN I (HIGH SENSITIVITY)
Troponin I (High Sensitivity): 7 ng/L (ref <18)
Troponin I (High Sensitivity): 7 ng/L (ref <18)

## 2022-09-08 LAB — CBC
HCT: 41.6 % (ref 36.0–46.0)
Hemoglobin: 13.1 g/dL (ref 12.0–15.0)
MCH: 29.8 pg (ref 26.0–34.0)
MCHC: 31.5 g/dL (ref 30.0–36.0)
MCV: 94.5 fL (ref 80.0–100.0)
Platelets: 231 10*3/uL (ref 150–400)
RBC: 4.4 MIL/uL (ref 3.87–5.11)
RDW: 13.5 % (ref 11.5–15.5)
WBC: 4.9 10*3/uL (ref 4.0–10.5)
nRBC: 0 % (ref 0.0–0.2)

## 2022-09-08 LAB — TSH: TSH: 3.086 u[IU]/mL (ref 0.350–4.500)

## 2022-09-08 LAB — PHENYTOIN LEVEL, TOTAL: Phenytoin Lvl: 17.4 ug/mL (ref 10.0–20.0)

## 2022-09-08 MED ORDER — DILTIAZEM HCL 60 MG PO TABS
30.0000 mg | ORAL_TABLET | Freq: Once | ORAL | Status: AC
  Administered 2022-09-08: 30 mg via ORAL
  Filled 2022-09-08: qty 1

## 2022-09-08 MED ORDER — DILTIAZEM HCL 30 MG PO TABS: 30.0000 mg | ORAL_TABLET | Freq: Three times a day (TID) | ORAL | 11 refills | Status: DC

## 2022-09-08 MED ORDER — DILTIAZEM HCL 25 MG/5ML IV SOLN
5.0000 mg | Freq: Once | INTRAVENOUS | Status: AC
  Administered 2022-09-08: 5 mg via INTRAVENOUS
  Filled 2022-09-08: qty 5

## 2022-09-08 NOTE — ED Notes (Signed)
Pt verbalized understanding of discharge instructions. Opportunity for questions provided.  

## 2022-09-08 NOTE — Discharge Instructions (Addendum)
Take the Cardizem 30 mg 1 pill 3 times a day.  If that seems to make your blood pressure too low you can cut it back to 1 twice a day about every 12 hours.  This might be low enough that you have an episode of atrial fibrillation between the doses though.  If you do have an episode of atrial fibrillation you can take one of the Cardizem pills when it starts and wait about half an hour and see if it makes the atrial fibrillation resolve.  If it does not resolve come to the hospital.  If you have any chest tightness or shortness of breath just come straight to the hospital with EMS.  Please follow-up with your cardiologist, Dr. Rockey Situ sometime in the next week or so.  Give his office a call and try and set up the appointment.  I have also put in a cardiology referral for you just in case you cannot get in quickly.

## 2022-09-08 NOTE — ED Notes (Signed)
Pt given morning med and tolerated well. Pt resting in bed with family at the bedside.

## 2022-09-08 NOTE — ED Notes (Signed)
Pt placed on a purewick 

## 2022-09-08 NOTE — ED Provider Notes (Signed)
[facility] Provider Note    Event Date/Time   First MD Initiated Contact with Patient 09/08/22 [phone]     (approximate)   History   Dizziness and Atrial Fibrillation   HPI  Tricia Edwards is a 88 y.o. female who reports dizziness.  She says she has not had dizziness like this that she usually gets with her A-fib since March.  Her daughter took her pulse this morning and found it was between 120 and 160.  Patient does not take anything for her A-fib.  She says she has been cardioverted twice.  She was most recently in the hospital here in July and her afib resolved w/ cardiazem iv      Physical Exam   Triage Vital Signs: ED Triage Vitals  Enc Vitals Group     BP 09/08/22 0817 (!) 120/92     Pulse Rate 09/08/22 0817 (!) 126     Resp 09/08/22 0817 18     Temp 09/08/22 0817 98.1 F (36.7 C)     Temp Source 09/08/22 0817 Oral     SpO2 09/08/22 0817 95 %     Weight 09/08/22 0813 150 lb (68 kg)     Height 09/08/22 0813 5\' 5"  (1.651 m)     Head Circumference --      Peak Flow --      Pain Score 09/08/22 0813 0     Pain Loc --      Pain Edu? --      Excl. in [place]? --     Most recent vital signs: Vitals:   09/08/22 1030 09/08/22 1100  BP: 133/76 (!) 138/94  Pulse: 80 96  Resp: 14 17  Temp:    SpO2: 100% 93%     General: Awake, no distress.  CV:  Good peripheral perfusion.  Heart tacky irregularly irregular rhythm Resp:  Normal effort.  Lungs are clear Abd:  No distention.  Soft bowel sounds positive nontender    ED Results / Procedures / Treatments   Labs (all labs ordered are listed, but only abnormal results are displayed) Labs Reviewed  BASIC METABOLIC PANEL - Abnormal; Notable for the following components:      Result Value   Calcium 8.8 (*)    All other components within normal limits  CBC  TSH  PHENYTOIN LEVEL, TOTAL  TROPONIN I (HIGH SENSITIVITY)  TROPONIN I (HIGH SENSITIVITY)     EKG  EKG read and interpreted by me  shows A-fib with a heart rate of 121 slight left axis no acute ST-T changes   RADIOLOGY Chest x-ray read by radiology as mild cardiomegaly.  I reviewed the film and interpreted and agree.   PROCEDURES:  Critical Care performed:   Procedures   MEDICATIONS ORDERED IN ED: Medications  diltiazem (CARDIZEM) injection 5 mg (5 mg Intravenous Given 09/08/22 0834)  diltiazem (CARDIZEM) tablet 30 mg (30 mg Oral Given 09/08/22 0854)     IMPRESSION / MDM / ASSESSMENT AND PLAN / ED COURSE  I reviewed the triage vital signs and the nursing notes. ----------------------------------------- 8:27 AM on 09/08/2022 ----------------------------------------- I will try a low-dose of IV of the diltiazem.  If her blood pressure tolerates this I will try a low-dose of p.o. medication and see if her blood pressure will stay up and her heart rate will stay down.  If this works I will discuss her with cardiology and try to discharge her.  If not we will try diltiazem drip like  we did last time.  ----------------------------------------- 9:14 AM on 09/08/2022 ----------------------------------------- Patient's heart rate is remaining in the 90s.  Blood pressure is good in the 130s and 140s after taking the p.o. Cardizem.  If it stays this way for an hour or 2 I will call cardiology and discuss letting her go.  Incidentally the vital sign recorded in the computer with SPO2 of 85% is incorrect.  It is in the 90s.  Mid 90s.  ----------------------------------------- 10:45 AM on 09/08/2022 ----------------------------------------- Patient's heart rate is in the 80s now.  Still A-fib.  Patient's blood pressure was 144 systolics laying down 1-1 18 systolic sitting and 132 standing.  Patient did not have any lightheadedness and was able to stand for about 3 to 4 minutes.  She is not having any other symptoms and feels good.  I am going to contact [facility] cardiology see if they can follow her up  closely and we will try and see if they agree to give her Cardizem CD30 milligrams once a day.  We will start this evening.  ----------------------------------------- 10:52 AM on 09/08/2022 ----------------------------------------- Discussed with Dr. [facility].  Instead of Cardizem CD will do the Cardizem 30 regular twice a day.  Patient can take an extra one of her blood pressure is good and her rate goes up they will follow her up in the next couple days.  This is assuming the second troponin is negative. Differential diagnosis includes, but is not limited to, A-fib with RVR.  Patient has a history of paroxysmal A-fib.  Patient's troponin is negative there is no sign of any heart attack.  There is no sign of hypothyroidism with normal TSH.  Patient does not have any sign of CHF.  Patient's presentation is most consistent with exacerbation of chronic illness.  the patient is on the cardiac monitor to evaluate for evidence of arrhythmia and/or significant heart rate changes.  None have been seen except for chronic A-fib with RVR which is now rate controlled.      FINAL CLINICAL IMPRESSION(S) / ED DIAGNOSES   Final diagnoses:  Atrial fibrillation with RVR (HCC)     Rx / DC Orders   ED Discharge Orders     None        Note:  This document was prepared using Dragon voice recognition software and may include unintentional dictation errors.   Arnaldo Natal, MD 09/08/22 1131

## 2022-09-08 NOTE — ED Triage Notes (Signed)
Pt to ED from home [facility] Woke up feeling dizzy, especially with standing.  daughter checked HR which was irregular, EMS found pt to be in a fib Pt takes eliquis but no rate control meds Pt has paroxysmal a fib, no episodes since 3/23 150/106, 96%, 120-140 HR, cbg and temp normal No IV access  EDP at bedside Pt is alert, oriented, no complaints

## 2022-09-10 ENCOUNTER — Encounter: Payer: Self-pay | Admitting: Medical

## 2022-09-10 ENCOUNTER — Ambulatory Visit: Payer: Medicare Other | Attending: Medical | Admitting: Medical

## 2022-09-10 VITALS — BP 128/70 | HR 77 | Ht 67.0 in | Wt 166.0 lb

## 2022-09-10 DIAGNOSIS — I951 Orthostatic hypotension: Secondary | ICD-10-CM | POA: Insufficient documentation

## 2022-09-10 DIAGNOSIS — I4819 Other persistent atrial fibrillation: Secondary | ICD-10-CM | POA: Insufficient documentation

## 2022-09-10 MED ORDER — AMIODARONE HCL 200 MG PO TABS: ORAL_TABLET | ORAL | 0 refills | Status: DC

## 2022-09-10 NOTE — Patient Instructions (Signed)
Medication Instructions:   Your physician has recommended you make the following change in your medication:   STOP Cardizem  START Amiodarone  - Take 400mg  by mouth twice a day for 1 week - Take 200 mg by mouth twice a day for 1 week - Take 200 mg by mouth daily there on after  *If you need a refill on your cardiac medications before your next appointment, please call your pharmacy*   Lab Work:  None Ordered  If you have labs (blood work) drawn today and your tests are completely normal, you will receive your results only by: [place] (if you have MyChart) OR A paper copy in the mail If you have any lab test that is abnormal or we need to change your treatment, we will call you to review the results.   Testing/Procedures:  None Ordered   Follow-Up: At [facility], you and your health needs are our priority.  As part of our continuing mission to provide you with exceptional heart care, we have created designated Provider Care Teams.  These Care Teams include your primary Cardiologist (physician) and Advanced Practice Providers (APPs -  Physician Assistants and Nurse Practitioners) who all work together to provide you with the care you need, when you need it.  We recommend signing up for the patient portal called "MyChart".  Sign up information is provided on this After Visit Summary.  MyChart is used to connect with patients for Virtual Visits (Telemedicine).  Patients are able to view lab/test results, encounter notes, upcoming appointments, etc.  Non-urgent messages can be sent to your provider as well.   To learn more about what you can do with MyChart, go to [email or web address].    Your next appointment:  As planned in December

## 2022-09-10 NOTE — Progress Notes (Signed)
Cardiology Office Note:    Date:  09/10/2022   ID:  Tricia Edwards, DOB 02-26-1930, MRN [id number]  PCP:  [facility]  [facility] Cardiologist:  None  [facility] Electrophysiologist:  None   Referring MD: [facility]   Chief Complaint: ER follow-up  History of Present Illness:    Tricia Edwards is a 88 y.o. female with a hx of persistent Afib s/p cardioversion in 08/2021 in [place], orthostatic hypotension with recurrent syncope, chronic HFpEF, HTN and HLD and is being seen for ER follow-up.    She has a long history of orthostasis managed by cardiologist in [place], on high dose Florinef with midodrine 3 times daily.   She was admitted 12/2021 for syncope felt to be vasovagal vs afib RVR with drops in BP. Upon evaluation by cardiology she was back in NSR. She had mild troponin elevation felt to be from afib RVR with suspected underlying ischmia. Amiodarone and digoxin were held. Family did not want to pursue any further ischemic testing.   Last seen 04/24/2022 and denied any recent episodes of near syncope near syncope.  She was taking Florinef 0.1mg  twice daily and midodrine 10 mg 3 times daily.  She is wearing thigh-high compressions, abdominal binder, staying hydrated.  Admitted in July 2023 for A-fib with RVR, syncope, acute heart failure.  It was felt acute heart failure exacerbated A-fib rates.  She was initially placed on Cardizem with resolution of A-fib. Patient was given 1 dose of Lasix 20 mg.  ER visit 09/08/2022 for dizziness and A-fib.  Pulse reported 120-160.  Blood pressure was 120/92, pulse rate 126 bpm.  EKG showed A-fib with a heart rate of 121.  Patient was given injection of cortisone 5 mg and diltiazem tablet 30 mg.  Dr. Oval Linsey was consulted who recommended Cardizem 30 mg twice a day.  Today, the patient is doing well. She it taking cardizem 30mg  TID. Appears she is in and out of afib. Patient does not feel she is in afib.  Heart rate is up to 130s at times. When in NSR heart rate is in the 80s. EKG today shows rate controlled Afib. She denies chest pain, SOB, LLE, orthopnea or pnd.   Past Medical History:  Diagnosis Date   Hypertension    Paroxysmal A-fib [facility])     Past Surgical History:  Procedure Laterality Date   APPENDECTOMY     KNEE SURGERY      Current Medications: Current Meds  Medication Sig   amiodarone (PACERONE) 200 MG tablet Take 2 tablets (400 mg total) by mouth 2 (two) times daily for 7 days, THEN 1 tablet (200 mg total) 2 (two) times daily for 7 days, THEN 1 tablet (200 mg total) daily.   atorvastatin (LIPITOR) 10 MG tablet Take 10 mg by mouth daily.   Calcium Carb-Cholecalciferol (CALCIUM 600/VITAMIN D PO) Take 1 tablet by mouth in the morning and at bedtime.   ELIQUIS 5 MG TABS tablet Take 5 mg by mouth 2 (two) times daily.   ferrous sulfate 324 MG TBEC Take 324 mg by mouth 3 (three) times a week. Take 1 tablet every morning, and 1 tablet in the evening every Wednesday and Saturday   fludrocortisone (FLORINEF) 0.1 MG tablet Take 2 tablets by mouth twice daily   gabapentin (NEURONTIN) 300 MG capsule Take 300 mg by mouth 2 (two) times daily.   midodrine (PROAMATINE) 10 MG tablet Take 10 mg by mouth 3 (three) times daily.   omeprazole (  PRILOSEC) 20 MG capsule Take 20 mg by mouth daily.   phenytoin (DILANTIN) 100 MG ER capsule Take 100-200 mg by mouth 2 (two) times daily. Take 200 mg every other morning alternating with 100 mg, and 100 mg capsule daily in the evening   senna (SENOKOT) 8.6 MG TABS tablet Take 1 tablet by mouth daily as needed for mild constipation.   [DISCONTINUED] diltiazem (CARDIZEM) 30 MG tablet Take 1 tablet (30 mg total) by mouth 3 (three) times daily. Cardizem 30 mg take 1 pill 3 times a day.  If you are feel like your blood pressure is not high enough you can cut it back to twice a day but that might make it easier for your A-fib to come back.  If your A-fib does come  back you can try 1 pill wait half an hour and see if it makes the A-fib resolve if not come in.  If you are having any chest pain or shortness of breath just come straight to the hospital with EMS.     Allergies:   Pneumovax [pneumococcal polysaccharide vaccine] and Pneumococcal vaccine   Social History   Socioeconomic History   Marital status: Widowed    Spouse name: Not on file   Number of children: Not on file   Years of education: Not on file   Highest education level: Not on file  Occupational History   Not on file  Tobacco Use   Smoking status: Former   Smokeless tobacco: Never  Vaping Use   Vaping Use: Never used  Substance and Sexual Activity   Alcohol use: Not Currently   Drug use: Not Currently   Sexual activity: Not on file  Other Topics Concern   Not on file  Social History Narrative   Not on file   Social Determinants of Health   Financial Resource Strain: Not on file  Food Insecurity: Not on file  Transportation Needs: Not on file  Physical Activity: Not on file  Stress: Not on file  Social Connections: Not on file     Family History: The patient's family history includes Intracerebral hemorrhage in her brother; Kidney cancer in her sister.  ROS:   Please see the history of present illness.     All other systems reviewed and are negative.  EKGs/Labs/Other Studies Reviewed:    The following studies were reviewed today:  Echo 2022  1. Left ventricular ejection fraction, by estimation, is 60 to 65%. The  left ventricle has normal function. The left ventricle has no regional  wall motion abnormalities. There is moderate left ventricular hypertrophy.  Left ventricular diastolic  parameters are consistent with Grade I diastolic dysfunction (impaired  relaxation).   2. Right ventricular systolic function is normal. The right ventricular  size is normal.   3. Left atrial size was mildly dilated.   EKG:  EKG is  ordered today.  The ekg ordered today  demonstrates A-fib, 77 bpm, no significant ST-T wave changes.  Recent Labs: 01/08/2022: ALT 23 05/22/2022: B Natriuretic Peptide 554.0; Magnesium 2.1 09/08/2022: BUN 16; Creatinine, Ser 0.90; Hemoglobin 13.1; Platelets 231; Potassium 4.0; Sodium 143; TSH 3.086  Recent Lipid Panel No results found for: "CHOL", "TRIG", "HDL", "CHOLHDL", "VLDL", "LDLCALC", "LDLDIRECT"   Physical Exam:    VS:  BP 128/70 (BP Location: Left Arm, Patient Position: Sitting, Cuff Size: Normal)   Pulse 77   Ht 5\' 7"  (1.702 m)   Wt 166 lb (75.3 kg)   SpO2 98%   BMI  26.00 kg/m     Wt Readings from Last 3 Encounters:  09/10/22 166 lb (75.3 kg)  09/08/22 150 lb (68 kg)  05/23/22 164 lb 0.7 oz (74.4 kg)     GEN:  Well nourished, well developed in no acute distress HEENT: Normal NECK: No JVD; No carotid bruits LYMPHATICS: No lymphadenopathy CARDIAC: Irregularly irregular, no murmurs, rubs, gallops RESPIRATORY:  Clear to auscultation without rales, wheezing or rhonchi  ABDOMEN: Soft, non-tender, non-distended MUSCULOSKELETAL:  No edema; No deformity  SKIN: Warm and dry NEUROLOGIC:  Alert and oriented x 3 PSYCHIATRIC:  Normal affect   ASSESSMENT:    1. Persistent atrial fibrillation ([place])   2. Orthostatic hypotension    PLAN:    In order of problems listed above:  Persistent Afib s/p prior cardioversion Recent ER visit for A-fib with RVR with rates up to 120s-130s.  Patient was given IV Cardizem injection 5 mg and Cardizem, 30 mg and sent home with Cardizem 30 mg 3 times daily.  Per patient report she has had heart rates up to 120s and heart rates into the 70s.  Appears patient is in and out of A-fib.  Patient generally does not feel when she is in A-fib, but can get dizzy at times.  Patient has a history of hypotension on midodrine and Florinef.  EKG today shows A-fib with a heart rate of 77 bpm.  Blood pressure is 128/70.  Patient is on Eliquis 5 mg twice daily, she denies missing any doses. I will stop  Cardizem and start amiodarone 400 mg twice daily x1 week, 200 mg twice daily x1 week, 200 mg thereafter.  She has a follow-up with Dr. Rockey Situ scheduled.  Orthostatic hypotension Blood pressure is good today on Florinef 0.1 mg 2 tablets twice daily and midodrine 10 mg 3 times a day.  Disposition: Follow up in 2-3 week(s) with MD    Signed, Yannick Steuber Ninfa Meeker, PA-C  09/10/2022 11:00 AM    [facility]

## 2022-09-16 ENCOUNTER — Telehealth: Payer: Self-pay | Admitting: *Deleted

## 2022-09-16 NOTE — Telephone Encounter (Signed)
-----   Message from Rise Mu, PA-C sent at 09/09/2022  7:53 AM EDT ----- Per Dr. Oval Linsey, this patient needs ED follow up.

## 2022-09-17 NOTE — Telephone Encounter (Signed)
Appointment has been scheduled.

## 2022-10-09 ENCOUNTER — Other Ambulatory Visit: Payer: Self-pay | Admitting: Medical

## 2022-10-25 NOTE — Progress Notes (Signed)
Cardiology Office Note  Date:  10/25/2022   ID:  Tricia Edwards, DOB 08-20-1930, MRN [id number]  PCP:  [facility]   No chief complaint on file.   HPI:  88 y.o. female with medical history significant for  paroxysmal A-fib on anticoagulation, history of cardioversion orthostatic hypotension with episodes of syncope and near syncope,  chronic labile blood pressure on midodrine and fludrocortisone daily  trigeminal neuralgia on carbamazepine  brought into the ER January 08, 2022 by her daughter for evaluation after she had several syncopal episodes that were witnessed. She presents today for office follow-up for near-syncope, syncope, atrial fibrillation  Last seen by myself in clinic June 2023  Admitted in July 2023 for A-fib with RVR, syncope, acute heart failure.  It was felt acute heart failure exacerbated A-fib rates.  She was initially placed on Cardizem with resolution of A-fib. Patient was given 1 dose of Lasix 20 mg.   ER visit 09/08/2022 for dizziness and A-fib.  Pulse reported 120-160.  Blood pressure was 120/92, pulse rate 126 bpm.  EKG showed A-fib with a heart rate of 121.  Patient was given injection of cortisone 5 mg and diltiazem tablet 30 mg.  Dr. Duke Salvia was consulted who recommended Cardizem 30 mg twice a day.  Seen by one of our providers October 2023     Presents today with her daughter Reports no recent episodes of near syncope or syncope on Florinef 0.2 mg twice daily and midodrine 10 mg 3 times daily No falls, gait improving Continues to wear thigh-high compressions, occasionally wearing abdominal binder  staying hydrated  Daughter is not aware of any near syncope spells Also unaware of any episodes of atrial fibrillation  EKG personally reviewed by myself on todays visit Normal sinus rhythm rate 69 bpm no significant ST-T wave changes  Recent events as below  first episode while sitting on the commode.  Patient states that she had  strained to have a bowel movement and subsequently felt dizzy and lightheaded.  Per her daughter she had a transient loss of consciousness and she took her back to her room and put her in bed. Patient had 2 more episodes while ambulatory and each time she felt dizzy and lightheaded and went limp.  Her daughter was concerned about the frequency of these episodes and states that she usually has 1 but never this many in 1 day. Patient has been on antibiotic therapy for UTI but denies having any fever, no chills, no nausea, no vomiting, no abdominal pain, no leg swelling, no headache, no cough, no palpitations, no diaphoresis, no blurred vision, no focal deficit. She received 2 L of IV fluids in the ER Presenting in atrial fibrillation, converted to sinus rhythm in the hospital after digoxin given Family declined amiodarone  Echocardiogram showing LVH, small LV cavity, morbid obesity, deconditioned Long history of orthostasis, near syncope and syncope dating back several years, managed by cardiology in [place] -Discussed various strategies with him including taking water, midodrine first thing in the morning before getting up Avoiding hot meals, large meals especially if she has not had her midodrine -Consider abdominal binder, compression hose Staying well-hydrated Checking orthostatics on a regular basis especially before going out shopping Extra midodrine as needed for orthostatic numbers   Zio monitor Apr 10, 2022 reviewed in detail Patient had a min HR of 57 bpm, max HR of 160 bpm, and avg HR of 71 bpm.   115 Supraventricular Tachycardia runs occurred, the run with the  fastest interval lasting 5 beats with a max rate of 160 bpm, the longest lasting 14 beats with an avg rate of 92 bpm. (Not patient triggered)   Isolated SVEs were rare (<1.0%), SVE Couplets were rare (<1.0%), and SVE Triplets were rare (<1.0%). Isolated VEs were occasional (1.2%, 16466), VE Couplets were rare (<1.0%,  33), and no VE Triplets  were present. Ventricular Bigeminy and Trigeminy were present.    Patient triggered events (3) associated with normal sinus rhythm, rare PVC   PMH:   has a past medical history of Hypertension and Paroxysmal A-fib (HCC).  PSH:    Past Surgical History:  Procedure Laterality Date   APPENDECTOMY     KNEE SURGERY      Current Outpatient Medications  Medication Sig Dispense Refill   amiodarone (PACERONE) 200 MG tablet Take 1 tablet (200 mg total) by mouth 2 (two) times daily. 72 tablet 0   atorvastatin (LIPITOR) 10 MG tablet Take 10 mg by mouth daily.     Calcium Carb-Cholecalciferol (CALCIUM 600/VITAMIN D PO) Take 1 tablet by mouth in the morning and at bedtime.     ELIQUIS 5 MG TABS tablet Take 5 mg by mouth 2 (two) times daily.     ferrous sulfate 324 MG TBEC Take 324 mg by mouth 3 (three) times a week. Take 1 tablet every morning, and 1 tablet in the evening every Wednesday and Saturday     fludrocortisone (FLORINEF) 0.1 MG tablet Take 2 tablets by mouth twice daily 120 tablet 5   gabapentin (NEURONTIN) 300 MG capsule Take 300 mg by mouth 2 (two) times daily.     midodrine (PROAMATINE) 10 MG tablet Take 10 mg by mouth 3 (three) times daily.     omeprazole (PRILOSEC) 20 MG capsule Take 20 mg by mouth daily.     phenytoin (DILANTIN) 100 MG ER capsule Take 100-200 mg by mouth 2 (two) times daily. Take 200 mg every other morning alternating with 100 mg, and 100 mg capsule daily in the evening     senna (SENOKOT) 8.6 MG TABS tablet Take 1 tablet by mouth daily as needed for mild constipation.     No current facility-administered medications for this visit.    Allergies:   Pneumovax [pneumococcal polysaccharide vaccine] and Pneumococcal vaccine   Social History:  The patient  reports that she has quit smoking. She has never used smokeless tobacco. She reports that she does not currently use alcohol. She reports that she does not currently use drugs.   Family  History:   family history includes Intracerebral hemorrhage in her brother; Kidney cancer in her sister.    Review of Systems: Review of Systems  Constitutional: Negative.   HENT: Negative.    Respiratory: Negative.    Cardiovascular: Negative.   Gastrointestinal: Negative.   Musculoskeletal: Negative.   Neurological: Negative.   Psychiatric/Behavioral: Negative.    All other systems reviewed and are negative.    PHYSICAL EXAM: VS:  There were no vitals taken for this visit. , BMI There is no height or weight on file to calculate BMI. GEN: Well nourished, well developed, in no acute distress HEENT: normal Neck: no JVD, carotid bruits, or masses Cardiac: RRR; no murmurs, rubs, or gallops,no edema  Respiratory:  clear to auscultation bilaterally, normal work of breathing GI: soft, nontender, nondistended, + BS MS: no deformity or atrophy Skin: warm and dry, no rash Neuro:  Strength and sensation are intact Psych: euthymic mood, full affect  Recent Labs:  01/08/2022: ALT 23 05/22/2022: B Natriuretic Peptide 554.0; Magnesium 2.1 09/08/2022: BUN 16; Creatinine, Ser 0.90; Hemoglobin 13.1; Platelets 231; Potassium 4.0; Sodium 143; TSH 3.086    Lipid Panel No results found for: "CHOL", "HDL", "LDLCALC", "TRIG"    Wt Readings from Last 3 Encounters:  09/10/22 166 lb (75.3 kg)  09/08/22 150 lb (68 kg)  05/23/22 164 lb 0.7 oz (74.4 kg)       ASSESSMENT AND PLAN:  Problem List Items Addressed This Visit   None Paroxysmal atrial fibrillation Not on digoxin at this time Patient/daughter previously declined amiodarone Unable to use AV nodal blocking agent /calcium channel blockers, metoprolol secondary to  orthostasis For recurrent atrial fibrillation May need to consider amiodarone Tolerating Eliquis, no recent falls  Orthostatic hypotension Symptoms have stabilized on high-dose midodrine 10 mg 3 times a day, Florinef 0.2 mg twice daily, with thigh-high compression hose,  hydration, abdominal binder Walking independently, no recent near-syncope or syncope episodes Etiology of above likely secondary to neurocardiogenic No changes to medications We have ordered BMP and CBC  Anemia Drop in hemoglobin down to 8 noted in February 2023 Repeat CBC ordered On Eliquis She is on daily iron   Total encounter time more than 30 minutes  Greater than 50% was spent in counseling and coordination of care with the patient    Signed, Dossie Arbour, M.D., Ph.D. [facility], [place] [phone]

## 2022-10-25 NOTE — H&P (View-Only) (Signed)
Cardiology Office Note  Date:  10/28/2022   ID:  Tricia Edwards, DOB 1930-02-02, MRN [id number]  PCP:  [facility]   Chief Complaint  Patient presents with   1 month follow up     "Doing well." Medications reviewed by the patient verbally.     HPI:  88 y.o. female with medical history significant for  paroxysmal A-fib on anticoagulation, history of cardioversion orthostatic hypotension with episodes of syncope and near syncope,  chronic labile blood pressure on midodrine and fludrocortisone daily  trigeminal neuralgia on carbamazepine  brought into the ER January 08, 2022 by her daughter for evaluation after she had several syncopal episodes that were witnessed. She presents today for office follow-up for near-syncope, syncope, atrial fibrillation  Last seen by myself in clinic June 2023  Admitted to the hospital July 2023 atrial fibrillation with RVR Syncopal episode with A-fib Treated with Cardizem infusion Cardiology was not consulted  In the emergency room September 08, 2022 for atrial fibrillation Given diltiazem 30 mg twice daily  Seen in clinic September 10, 2022 by one of our providers On that visit was taking diltiazem 30 3 times daily On that visit was in atrial fibrillation Was started on amiodarone 400 twice daily for 1 week then 200 twice daily 1 week then 200 after that  Still in atrial fibrillation today, rate has improved Reports continued fatigue, attributes this to her atrial fibrillation Able to walk around the house, uses a wheelchair Not able to cook much in the kitchen secondary to orthostasis Diltiazem previously held  Orthostatics done today positive, blood pressure supine 161/97 down to 120/75 standing, 85/50 after 3 minutes standing, no significant change in heart rate 90-109  Lab work reviewed  hemoglobin from 8.2 up to 13.1 over the course of the year on iron  .tgelg Atrial fibrillation with ventricular rate 82 bpm  Other  past medical history reviewed Admitted in July 2023 for A-fib with RVR, syncope, acute heart failure.  It was felt acute heart failure exacerbated A-fib rates.  She was initially placed on Cardizem with resolution of A-fib. Patient was given 1 dose of Lasix 20 mg.   ER visit 09/08/2022 for dizziness and A-fib.  Pulse reported 120-160.  Blood pressure was 120/92, pulse rate 126 bpm.  EKG showed A-fib with a heart rate of 121.  Patient was given injection of cortisone 5 mg and diltiazem tablet 30 mg.  Dr. Duke Salvia was consulted who recommended Cardizem 30 mg twice a day.  Seen by one of our providers October 2023  Prior syncope episodes  first episode while sitting on the commode.  Patient states that she had strained to have a bowel movement and subsequently felt dizzy and lightheaded.  Per her daughter she had a transient loss of consciousness and she took her back to her room and put her in bed. Patient had 2 more episodes while ambulatory and each time she felt dizzy and lightheaded and went limp.  Her daughter was concerned about the frequency of these episodes and states that she usually has 1 but never this many in 1 day. Patient has been on antibiotic therapy for UTI but denies having any fever, no chills, no nausea, no vomiting, no abdominal pain, no leg swelling, no headache, no cough, no palpitations, no diaphoresis, no blurred vision, no focal deficit. She received 2 L of IV fluids in the ER Presenting in atrial fibrillation, converted to sinus rhythm in the hospital after digoxin given Family declined  amiodarone  Echocardiogram showing LVH, small LV cavity, morbid obesity, deconditioned Long history of orthostasis, near syncope and syncope dating back several years, managed by cardiology in [place] -Discussed various strategies with him including taking water, midodrine first thing in the morning before getting up Avoiding hot meals, large meals especially if she has not had her  midodrine -Consider abdominal binder, compression hose Staying well-hydrated Checking orthostatics on a regular basis especially before going out shopping Extra midodrine as needed for orthostatic numbers  Zio monitor Apr 10, 2022 reviewed in detail Patient had a min HR of 57 bpm, max HR of 160 bpm, and avg HR of 71 bpm.   115 Supraventricular Tachycardia runs occurred, the run with the fastest interval lasting 5 beats with a max rate of 160 bpm, the longest lasting 14 beats with an avg rate of 92 bpm. (Not patient triggered)   Isolated SVEs were rare (<1.0%), SVE Couplets were rare (<1.0%), and SVE Triplets were rare (<1.0%). Isolated VEs were occasional (1.2%, 16466), VE Couplets were rare (<1.0%, 33), and no VE Triplets  were present. Ventricular Bigeminy and Trigeminy were present.    Patient triggered events (3) associated with normal sinus rhythm, rare PVC   PMH:   has a past medical history of Hypertension and Paroxysmal A-fib (HCC).  PSH:    Past Surgical History:  Procedure Laterality Date   APPENDECTOMY     KNEE SURGERY      Current Outpatient Medications  Medication Sig Dispense Refill   amiodarone (PACERONE) 200 MG tablet Take 1 tablet (200 mg total) by mouth 2 (two) times daily. 72 tablet 0   atorvastatin (LIPITOR) 10 MG tablet Take 10 mg by mouth daily.     Calcium Carb-Cholecalciferol (CALCIUM 600/VITAMIN D PO) Take 1 tablet by mouth in the morning and at bedtime.     ELIQUIS 5 MG TABS tablet Take 5 mg by mouth 2 (two) times daily.     ferrous sulfate 324 MG TBEC Take 324 mg by mouth 3 (three) times a week. Take 1 tablet every morning, and 1 tablet in the evening every Wednesday and Saturday     fludrocortisone (FLORINEF) 0.1 MG tablet Take 2 tablets by mouth twice daily 120 tablet 5   gabapentin (NEURONTIN) 300 MG capsule Take 300 mg by mouth 2 (two) times daily.     midodrine (PROAMATINE) 10 MG tablet Take 10 mg by mouth 3 (three) times daily.     omeprazole  (PRILOSEC) 20 MG capsule Take 20 mg by mouth daily.     phenytoin (DILANTIN) 100 MG ER capsule Take 100-200 mg by mouth 2 (two) times daily. Take 200 mg every other morning alternating with 100 mg, and 100 mg capsule daily in the evening     senna (SENOKOT) 8.6 MG TABS tablet Take 1 tablet by mouth daily as needed for mild constipation.     No current facility-administered medications for this visit.    Allergies:   Pneumovax [pneumococcal polysaccharide vaccine] and Pneumococcal vaccine   Social History:  The patient  reports that she has quit smoking. She has never used smokeless tobacco. She reports that she does not currently use alcohol. She reports that she does not currently use drugs.   Family History:   family history includes Intracerebral hemorrhage in her brother; Kidney cancer in her sister.    Review of Systems: Review of Systems  Constitutional: Negative.   HENT: Negative.    Respiratory: Negative.    Cardiovascular: Negative.  Gastrointestinal: Negative.   Musculoskeletal: Negative.   Neurological: Negative.   Psychiatric/Behavioral: Negative.    All other systems reviewed and are negative.    PHYSICAL EXAM: VS:  BP (!) 140/60 (BP Location: Left Arm, Patient Position: Sitting, Cuff Size: Normal)   Pulse 82   Ht 5\' 7"  (1.702 m)   Wt 168 lb 8 oz (76.4 kg)   SpO2 97%   BMI 26.39 kg/m  , BMI Body mass index is 26.39 kg/m. Constitutional:  oriented to person, place, and time. No distress.  HENT:  Head: Grossly normal Eyes:  no discharge. No scleral icterus.  Neck: No JVD, no carotid bruits  Cardiovascular: Irregularly irregular, no murmurs appreciated Pulmonary/Chest: Clear to auscultation bilaterally, no wheezes or rails Abdominal: Soft.  no distension.  no tenderness.  Musculoskeletal: Normal range of motion Neurological:  normal muscle tone. Coordination normal. No atrophy Skin: Skin warm and dry Psychiatric: normal affect, pleasant  Recent  Labs: 01/08/2022: ALT 23 05/22/2022: B Natriuretic Peptide 554.0; Magnesium 2.1 09/08/2022: BUN 16; Creatinine, Ser 0.90; Hemoglobin 13.1; Platelets 231; Potassium 4.0; Sodium 143; TSH 3.086   Lipid Panel No results found for: "CHOL", "HDL", "LDLCALC", "TRIG"    Wt Readings from Last 3 Encounters:  10/28/22 168 lb 8 oz (76.4 kg)  09/10/22 166 lb (75.3 kg)  09/08/22 150 lb (68 kg)     ASSESSMENT AND PLAN:  Problem List Items Addressed This Visit       Cardiology Problems   Chronic diastolic congestive heart failure (HCC)   Chronic orthostatic hypotension     Other   Iron deficiency anemia   Elevated troponin   Syncope and collapse   Other Visit Diagnoses     Persistent atrial fibrillation (HCC)    -  Primary   Paroxysmal A-fib (HCC)       Hyperlipidemia, mixed         Paroxysmal atrial fibrillation Tolerating Eliquis, no recent falls Has been loaded with amiodarone, remains in atrial fibrillation with associated symptoms of fatigue We have recommended cardioversion before she leaves to go to [place] for 2 or 3 months We will attempt to schedule for later this week Continue amiodarone 200 mg twice a day for now  Orthostatic hypotension Symptoms have stabilized on high-dose midodrine 10 mg 3 times a day, Florinef 0.2 mg twice daily, with thigh-high compression hose, hydration, abdominal binder Still with significant orthostasis on today's visit, possibly exacerbated by atrial fibrillation Plan to restore normal sinus rhythm as above  Anemia Drop in hemoglobin down to 8 noted in February 2023 On Eliquis, hemoglobin has trended back up to 13   Total encounter time more than 40 minutes  Greater than 50% was spent in counseling and coordination of care with the patient    Signed, March 2023, M.D., Ph.D. [facility] [place], [place]

## 2022-10-28 ENCOUNTER — Ambulatory Visit: Payer: Medicare Other | Attending: Cardiovascular Disease | Admitting: Cardiovascular Disease

## 2022-10-28 ENCOUNTER — Telehealth: Payer: Self-pay | Admitting: Cardiovascular Disease

## 2022-10-28 ENCOUNTER — Other Ambulatory Visit
Admission: RE | Admit: 2022-10-28 | Discharge: 2022-10-28 | Disposition: A | Discharge status: Home / Self Care | Payer: Medicare Other | Source: Ambulatory Visit | Attending: Cardiovascular Disease | Admitting: Cardiovascular Disease

## 2022-10-28 ENCOUNTER — Encounter: Payer: Self-pay | Admitting: Cardiovascular Disease

## 2022-10-28 VITALS — BP 140/60 | HR 82 | Ht 67.0 in | Wt 168.5 lb

## 2022-10-28 DIAGNOSIS — E782 Mixed hyperlipidemia: Secondary | ICD-10-CM | POA: Diagnosis present

## 2022-10-28 DIAGNOSIS — I951 Orthostatic hypotension: Secondary | ICD-10-CM | POA: Diagnosis not present

## 2022-10-28 DIAGNOSIS — R55 Syncope and collapse: Secondary | ICD-10-CM

## 2022-10-28 DIAGNOSIS — Z01812 Encounter for preprocedural laboratory examination: Secondary | ICD-10-CM | POA: Insufficient documentation

## 2022-10-28 DIAGNOSIS — D509 Iron deficiency anemia, unspecified: Secondary | ICD-10-CM | POA: Diagnosis present

## 2022-10-28 DIAGNOSIS — I5032 Chronic diastolic (congestive) heart failure: Secondary | ICD-10-CM

## 2022-10-28 DIAGNOSIS — I480 Paroxysmal atrial fibrillation: Secondary | ICD-10-CM

## 2022-10-28 DIAGNOSIS — R7989 Other specified abnormal findings of blood chemistry: Secondary | ICD-10-CM

## 2022-10-28 DIAGNOSIS — I4819 Other persistent atrial fibrillation: Secondary | ICD-10-CM | POA: Diagnosis not present

## 2022-10-28 LAB — CBC
HCT: 37.3 % (ref 36.0–46.0)
Hemoglobin: 12 g/dL (ref 12.0–15.0)
MCH: 30.5 pg (ref 26.0–34.0)
MCHC: 32.2 g/dL (ref 30.0–36.0)
MCV: 94.9 fL (ref 80.0–100.0)
Platelets: 194 10*3/uL (ref 150–400)
RBC: 3.93 MIL/uL (ref 3.87–5.11)
RDW: 13.4 % (ref 11.5–15.5)
WBC: 6.3 10*3/uL (ref 4.0–10.5)
nRBC: 0 % (ref 0.0–0.2)

## 2022-10-28 LAB — BASIC METABOLIC PANEL
Anion gap: 9 (ref 5–15)
BUN: 16 mg/dL (ref 8–23)
CO2: 30 mmol/L (ref 22–32)
Calcium: 8.5 mg/dL — ABNORMAL LOW (ref 8.9–10.3)
Chloride: 105 mmol/L (ref 98–111)
Creatinine, Ser: 1.29 mg/dL — ABNORMAL HIGH (ref 0.44–1.00)
GFR, Estimated: 39 mL/min — ABNORMAL LOW (ref >60)
Glucose, Bld: 81 mg/dL (ref 70–99)
Potassium: 3.1 mmol/L — ABNORMAL LOW (ref 3.5–5.1)
Sodium: 144 mmol/L (ref 135–145)

## 2022-10-28 MED ORDER — POTASSIUM CHLORIDE CRYS ER 20 MEQ PO TBCR: EXTENDED_RELEASE_TABLET | ORAL | 0 refills | Status: DC

## 2022-10-28 NOTE — Telephone Encounter (Signed)
Jefferey Pica, RN 10/28/2022  4:09 PM EST Back to Top    Verbally reviewed the patient's lab results with Dr. Mariah Milling regarding hypokalemia. Verbal orders received from Dr. Mariah Milling: - Potassium 20 meq: Take 1 tablet daily x 3 days   I have called and spoken with the patient's daughter, [name] (ok per DPR). Mary voices understanding of the above recommendations and is agreeable.   Confirmed [facility] on [address] as the pharmacy.   I also inquired if the patient is staying hydrated with a creatinine of 1.2 and per her daughter, she has to constantly remind the patient to drink.  I did encourage hydration as well.

## 2022-10-28 NOTE — Patient Instructions (Addendum)
Medication Instructions:  No changes  Please make sure you are taking Amiodarone 200 mg twice daily (or every 12 hours)  If you need a refill on your cardiac medications before your next appointment, please call your pharmacy.    Lab work: - Your physician recommends that you have pre-procedure lab work today:   BMP/ Electrical engineer at [facility] 1st desk on the right to check in (REGISTRATION)  Lab hours: Monday- Friday (7:30 am- 5:30 pm)   Testing/Procedures:  1) Cardioversion: - Your physician has recommended that you have a Cardioversion (DCCV). Electrical Cardioversion uses a jolt of electricity to your heart either through paddles or wired patches attached to your chest. This is a controlled, usually prescheduled, procedure. Defibrillation is done under light anesthesia in the hospital, and you usually go home the day of the procedure. This is done to get your heart back into a normal rhythm. You are not awake for the procedure.   You are scheduled for a Cardioversion on Thursday 10/31/22 with Dr. Mariah Milling.  Please arrive at the [facility] at 6:45  a.m. on the day of your procedure.  You will come to the 1st desk on the right to check in (REGISTRATION).   DIET INSTRUCTIONS:  Nothing to eat or drink after midnight the night prior to your procedure.          Labs: as above  Medications:  You may take all of your regular morning medications the day of your procedure with enough water to get them down safely   Must have a responsible person to drive you home.  Bring a current list of your medications and current insurance cards.    If you have any questions after you get home, please call the office at [phone]     Follow-Up: At [facility], you and your health needs are our priority.  As part of our continuing mission to provide you with exceptional heart care, we have created designated Provider Care Teams.  These Care Teams include your primary  Cardiologist (physician) and Advanced Practice Providers (APPs -  Physician Assistants and Nurse Practitioners) who all work together to provide you with the care you need, when you need it.  You will need a follow up appointment in 6 months  Providers on your designated Care Team:   Nicolasa Ducking, NP Eula Listen, PA-C Cadence Fransico Michael, [place]  COVID-19 Vaccine Information can be found at: [email or web address] For questions related to vaccine distribution or appointments, please email [email or web address] or call [phone].    Electrical Cardioversion Electrical cardioversion is the delivery of a jolt of electricity to restore a normal rhythm to the heart. A rhythm that is too fast or is not regular keeps the heart from pumping well. There are two kinds of cardioversion. Pharmacologic (chemical) cardioversion is when your health care provider gives you one or more medicines to bring back your regular heartbeat. The second way to restore regular rhythms is by sending an electrical shock to the heart. This is called electrical cardioversion. Electrical cardioversion is done as a scheduled procedure for irregular or fast heart rhythms that are not immediately life-threatening. Electrical cardioversion may also be done in an emergency for sudden life-threatening arrhythmias. Tell a health care provider about: Any allergies you have. All medicines you are taking, including vitamins, herbs, eye drops, creams, and over-the-counter medicines. Any problems you or family members have had with anesthetic medicines. Any bleeding problems you have. Any surgeries you have  had, including a pacemaker, defibrillator, or other implanted device. Any medical conditions you have. Whether you are pregnant or may be pregnant. What are the risks? Your health care provider will talk with you about risks. These include: Allergic reactions to  medicines. Irritation to the skin on your chest or back where the electrodes were applied during electrical cardioversion. A blood clot that breaks free and travels to other parts of your body, such as your brain. The possible return of a worse abnormal heart rhythm that will need to be treated with medicines, a pacemaker, or an implantable cardioverter defibrillator (ICD). What happens before the procedure? Medicines Your health care provider may have you start taking: Blood-thinning medicines (anticoagulants) so your blood does not clot as easily. If your doctor gives you this medicine, you may need to take it for 3 to 4 weeks before the procedure. Medicines to help stabilize your heart rate and rhythm. Ask your health care provider about: Changing or stopping your regular medicines. These include any diabetes medicines or blood thinners you take. Taking medicines such as aspirin and ibuprofen. These medicines can thin your blood. Do not take them unless your health care provider tells you to. Taking over-the-counter medicines, vitamins, herbs, and supplements. General instructions Follow instructions from your health care provider about what you may eat and drink. Do not put any lotions, powders, or ointments on your chest and back for 24 hours before the procedure. They can cause problems with the paddles used to deliver electricity to your heart. Do not wear jewelry as this can interfere with delivering electricity to your heart. If you will be going home right after the procedure, plan to have a responsible adult: Take you home from the hospital or clinic. You will not be allowed to drive. Care for you for the time you are told. Tests You may have an exam or testing. A transesophageal echocardiogram (TEE) may be performed if necessary (or preferred) to rule out left atrial blood clot. What happens during the procedure?     An IV will be inserted into one of your veins. Sticky  patches (electrodes) or metal paddles may be placed on your chest. The electrodes or paddles will be placed in one of these ways: One on your right chest, the other on the left ribs. One may be placed on your chest and the other on your back. Or both can be placed on your chest. You may be given a sedative. This helps you relax. An electrical shock will be delivered. The shock briefly stops (resets) your heart rhythm. Your health care provider will check to see if your heartbeat is regular. Some people need only one shock. Some need more to restore a regular heartbeat. The procedure may vary among health care providers and hospitals. What happens after the procedure? Your blood pressure, heart rate, breathing rate, and blood oxygen level will be monitored until you leave the hospital or clinic. Your heart rhythm will be watched to make sure it does not change. Summary Electrical cardioversion is the delivery of a jolt of electricity to restore a normal rhythm to the heart. This procedure may be done as a scheduled procedure if the condition is not an emergency. You may have irritation to the skin on your chest or back where the electrodes were applied during the procedure. Your health care provider will check to see if your heartbeat is regular. Some people need only one shock. Some need more to restore a regular heartbeat.  Your blood pressure, heart rate, breathing rate, and blood oxygen level will be monitored until you leave the hospital or clinic. This information is not intended to replace advice given to you by your health care provider. Make sure you discuss any questions you have with your health care provider. Document Revised: 02/26/2022 Document Reviewed: 02/28/2022 Elsevier Patient Education  2023 [employer].

## 2022-10-30 ENCOUNTER — Other Ambulatory Visit: Payer: Self-pay | Admitting: Cardiovascular Disease

## 2022-10-30 DIAGNOSIS — I4819 Other persistent atrial fibrillation: Secondary | ICD-10-CM

## 2022-10-31 ENCOUNTER — Ambulatory Visit: Payer: Medicare Other | Admitting: Anesthesiology

## 2022-10-31 ENCOUNTER — Other Ambulatory Visit: Payer: Self-pay

## 2022-10-31 ENCOUNTER — Encounter: Payer: Self-pay | Admitting: Cardiovascular Disease

## 2022-10-31 ENCOUNTER — Encounter
Admission: RE | Disposition: A | Discharge status: Home / Self Care | Payer: Self-pay | Source: Home / Self Care | Attending: Cardiovascular Disease

## 2022-10-31 ENCOUNTER — Ambulatory Visit (HOSPITAL_BASED_OUTPATIENT_CLINIC_OR_DEPARTMENT_OTHER)
Admission: RE | Admit: 2022-10-31 | Discharge: 2022-10-31 | Disposition: A | Discharge status: Home / Self Care | Payer: Medicare Other | Source: Home / Self Care | Attending: Cardiovascular Disease | Admitting: Cardiovascular Disease

## 2022-10-31 DIAGNOSIS — I110 Hypertensive heart disease with heart failure: Secondary | ICD-10-CM | POA: Insufficient documentation

## 2022-10-31 DIAGNOSIS — I4819 Other persistent atrial fibrillation: Secondary | ICD-10-CM | POA: Insufficient documentation

## 2022-10-31 DIAGNOSIS — G500 Trigeminal neuralgia: Secondary | ICD-10-CM | POA: Insufficient documentation

## 2022-10-31 DIAGNOSIS — Z7901 Long term (current) use of anticoagulants: Secondary | ICD-10-CM | POA: Insufficient documentation

## 2022-10-31 DIAGNOSIS — I951 Orthostatic hypotension: Secondary | ICD-10-CM | POA: Insufficient documentation

## 2022-10-31 DIAGNOSIS — E782 Mixed hyperlipidemia: Secondary | ICD-10-CM | POA: Insufficient documentation

## 2022-10-31 DIAGNOSIS — R0989 Other specified symptoms and signs involving the circulatory and respiratory systems: Secondary | ICD-10-CM | POA: Insufficient documentation

## 2022-10-31 DIAGNOSIS — Z87891 Personal history of nicotine dependence: Secondary | ICD-10-CM | POA: Insufficient documentation

## 2022-10-31 DIAGNOSIS — I5032 Chronic diastolic (congestive) heart failure: Secondary | ICD-10-CM | POA: Insufficient documentation

## 2022-10-31 DIAGNOSIS — D509 Iron deficiency anemia, unspecified: Secondary | ICD-10-CM | POA: Insufficient documentation

## 2022-10-31 DIAGNOSIS — R0603 Acute respiratory distress: Secondary | ICD-10-CM | POA: Diagnosis not present

## 2022-10-31 DIAGNOSIS — I2510 Atherosclerotic heart disease of native coronary artery without angina pectoris: Secondary | ICD-10-CM | POA: Insufficient documentation

## 2022-10-31 DIAGNOSIS — J9601 Acute respiratory failure with hypoxia: Secondary | ICD-10-CM | POA: Diagnosis not present

## 2022-10-31 DIAGNOSIS — Z79899 Other long term (current) drug therapy: Secondary | ICD-10-CM | POA: Insufficient documentation

## 2022-10-31 DIAGNOSIS — R0602 Shortness of breath: Secondary | ICD-10-CM

## 2022-10-31 HISTORY — PX: CARDIOVERSION: SHX1299

## 2022-10-31 SURGERY — CARDIOVERSION
Anesthesia: General

## 2022-10-31 MED ORDER — PROPOFOL 10 MG/ML IV BOLUS
INTRAVENOUS | Status: DC | PRN
  Administered 2022-10-31: 30 mg via INTRAVENOUS
  Administered 2022-10-31: 10 mg via INTRAVENOUS

## 2022-10-31 MED ORDER — SODIUM CHLORIDE 0.9 % IV SOLN: INTRAVENOUS | Status: DC

## 2022-10-31 NOTE — Transfer of Care (Signed)
Immediate Anesthesia Transfer of Care Note  Patient: Tricia Edwards  Procedure(s) Performed: CARDIOVERSION  Patient Location:  acrdiac short stay  Anesthesia Type:General  Level of Consciousness: awake and alert   Airway & Oxygen Therapy: Patient Spontanous Breathing and Patient connected to nasal cannula oxygen  Post-op Assessment: Report given to RN and Post -op Vital signs reviewed and stable  Post vital signs: Reviewed and stable  Last Vitals:  Vitals Value Taken Time  BP 175/89 10/31/22 0750  Temp    Pulse 71 10/31/22 0750  Resp 19 10/31/22 0750  SpO2 95 % 10/31/22 0750  Vitals shown include unvalidated device data.  Last Pain:  Vitals:   10/31/22 0713  PainSc: 0-No pain         Complications: No notable events documented.

## 2022-10-31 NOTE — CV Procedure (Signed)
Cardioversion procedure note For atrial fibrillation, persistent  Procedure Details:  Consent: Risks of procedure as well as the alternatives and risks of each were explained to the (patient/caregiver).  Consent for procedure obtained.  Time Out: Verified patient identification, verified procedure, site/side was marked, verified correct patient position, special equipment/implants available, medications/allergies/relevent history reviewed, required imaging and test results available.  Performed  Patient placed on cardiac monitor, pulse oximetry, supplemental oxygen as necessary.   Sedation given: propofol IV, Dr. Oliver Pacer pads placed anterior and posterior chest.   Cardioverted 1 time(s).   Cardioverted at  150 J. Synchronized biphasic Converted to NSR   Evaluation: Findings: Post procedure EKG shows: NSR Complications: None Patient did tolerate procedure well.  Time Spent Directly with the Patient:  45 minutes   Tim Betsie Peckman, M.D., Ph.D. 

## 2022-10-31 NOTE — Anesthesia Procedure Notes (Signed)
Date/Time: 10/31/2022 7:37 AM  Performed by: Ginger Carne, CRNAPre-anesthesia Checklist: Patient identified, Timeout performed, Emergency Drugs available, Suction available and Patient being monitored Patient Re-evaluated:Patient Re-evaluated prior to induction Oxygen Delivery Method: Nasal cannula Preoxygenation: Pre-oxygenation with 100% oxygen Induction Type: IV induction

## 2022-10-31 NOTE — Anesthesia Postprocedure Evaluation (Signed)
Anesthesia Post Note  Patient: Tricia Edwards  Procedure(s) Performed: CARDIOVERSION  Patient location during evaluation: Specials Recovery Anesthesia Type: General Level of consciousness: awake and alert Pain management: pain level controlled Vital Signs Assessment: post-procedure vital signs reviewed and stable Respiratory status: spontaneous breathing, nonlabored ventilation, respiratory function stable and patient connected to nasal cannula oxygen Cardiovascular status: blood pressure returned to baseline and stable Postop Assessment: no apparent nausea or vomiting Anesthetic complications: no   No notable events documented.   Last Vitals:  Vitals:   10/31/22 0749 10/31/22 0750  BP:  (!) 175/89  Pulse: 70 71  Resp: 15 18  SpO2: 94%     Last Pain:  Vitals:   10/31/22 0713  PainSc: 0-No pain                 Louie Boston

## 2022-10-31 NOTE — Anesthesia Preprocedure Evaluation (Addendum)
Anesthesia Evaluation  Patient identified by MRN, date of birth, ID band Patient awake    Reviewed: Allergy & Precautions, NPO status , Patient's Chart, lab work & pertinent test results  History of Anesthesia Complications Negative for: history of anesthetic complications  Airway Mallampati: II  TM Distance: >3 FB Neck ROM: full    Dental  (+) Edentulous Upper, Edentulous Lower   Pulmonary former smoker   Pulmonary exam normal        Cardiovascular hypertension, + CAD and +CHF (Grade 1 Diastolic Dysfunction)  + dysrhythmias Atrial Fibrillation  Rhythm:Irregular Rate:Normal  Orthostatic hypotension on midodrine and fludrocortisone   IMPRESSIONS     1. Left ventricular ejection fraction, by estimation, is 60 to 65%. The  left ventricle has normal function. The left ventricle has no regional  wall motion abnormalities. There is moderate left ventricular hypertrophy.  Left ventricular diastolic  parameters are consistent with Grade I diastolic dysfunction (impaired  relaxation).   2. Right ventricular systolic function is normal. The right ventricular  size is normal.   3. Left atrial size was mildly dilated.     Neuro/Psych Trigeminal neuralgia   Neuromuscular disease  negative psych ROS   GI/Hepatic negative GI ROS, Neg liver ROS,,,  Endo/Other  negative endocrine ROS    Renal/GU Renal disease  negative genitourinary   Musculoskeletal   Abdominal   Peds  Hematology Eliquis    Anesthesia Other Findings Past Medical History: No date: Hypertension No date: Paroxysmal A-fib (HCC)  Past Surgical History: No date: APPENDECTOMY No date: KNEE SURGERY     Reproductive/Obstetrics negative OB ROS                             Anesthesia Physical Anesthesia Plan  ASA: 3  Anesthesia Plan: General   Post-op Pain Management: Minimal or no pain anticipated   Induction:  Intravenous  PONV Risk Score and Plan: Propofol infusion and TIVA  Airway Management Planned: Natural Airway and Nasal Cannula  Additional Equipment:   Intra-op Plan:   Post-operative Plan:   Informed Consent: I have reviewed the patients History and Physical, chart, labs and discussed the procedure including the risks, benefits and alternatives for the proposed anesthesia with the patient or authorized representative who has indicated his/her understanding and acceptance.     Dental Advisory Given  Plan Discussed with: Anesthesiologist, CRNA and Surgeon  Anesthesia Plan Comments: (Patient consented for risks of anesthesia including but not limited to:  - adverse reactions to medications - risk of airway placement if required - damage to eyes, teeth, lips or other oral mucosa - nerve damage due to positioning  - sore throat or hoarseness - Damage to heart, brain, nerves, lungs, other parts of body or loss of life  Patient voiced understanding.)       Anesthesia Quick Evaluation

## 2022-11-01 ENCOUNTER — Encounter: Payer: Self-pay | Admitting: Cardiovascular Disease

## 2022-11-01 NOTE — H&P (Signed)
H&P Addendum, pre-cardioversion ° °Patient was seen and evaluated prior to -cardioversion procedure °Symptoms, prior testing details again confirmed with the patient °Patient examined, no significant change from prior exam °Lab work reviewed in detail personally by myself °Patient understands risk and benefit of the procedure,  °The risks (stroke, cardiac arrhythmias rarely resulting in the need for a temporary or permanent pacemaker, skin irritation or burns and complications associated with conscious sedation including aspiration, arrhythmia, respiratory failure and death), benefits (restoration of normal sinus rhythm) and alternatives of a direct current cardioversion were explained in detail °Patient willing to proceed. ° °Signed, °Tim Ericah Scotto, MD, Ph.D °[facility]  °

## 2022-11-01 NOTE — Interval H&P Note (Signed)
History and Physical Interval Note:  11/01/2022 4:45 PM  Tricia Edwards  has presented today for surgery, with the diagnosis of Cardioversion   Afib  OK 2nd case per Dr Pernell Dupre   8a start time.  The various methods of treatment have been discussed with the patient and family. After consideration of risks, benefits and other options for treatment, the patient has consented to  Procedure(s): CARDIOVERSION (N/A) as a surgical intervention.  The patient's history has been reviewed, patient examined, no change in status, stable for surgery.  I have reviewed the patient's chart and labs.  Questions were answered to the patient's satisfaction.     [name]

## 2022-11-02 ENCOUNTER — Emergency Department: Payer: Medicare Other

## 2022-11-02 ENCOUNTER — Other Ambulatory Visit: Payer: Self-pay

## 2022-11-02 ENCOUNTER — Observation Stay: Payer: Medicare Other

## 2022-11-02 ENCOUNTER — Inpatient Hospital Stay
Admission: EM | Admit: 2022-11-02 | Discharge: 2022-11-04 | DRG: 189 | Disposition: A | Discharge status: Home / Self Care | Payer: Medicare Other | Attending: Student | Admitting: Student

## 2022-11-02 DIAGNOSIS — F419 Anxiety disorder, unspecified: Secondary | ICD-10-CM | POA: Diagnosis present

## 2022-11-02 DIAGNOSIS — E785 Hyperlipidemia, unspecified: Secondary | ICD-10-CM | POA: Diagnosis present

## 2022-11-02 DIAGNOSIS — E876 Hypokalemia: Secondary | ICD-10-CM | POA: Diagnosis not present

## 2022-11-02 DIAGNOSIS — I2510 Atherosclerotic heart disease of native coronary artery without angina pectoris: Secondary | ICD-10-CM | POA: Diagnosis present

## 2022-11-02 DIAGNOSIS — J440 Chronic obstructive pulmonary disease with acute lower respiratory infection: Secondary | ICD-10-CM | POA: Diagnosis present

## 2022-11-02 DIAGNOSIS — Z87891 Personal history of nicotine dependence: Secondary | ICD-10-CM

## 2022-11-02 DIAGNOSIS — Z7952 Long term (current) use of systemic steroids: Secondary | ICD-10-CM

## 2022-11-02 DIAGNOSIS — R0603 Acute respiratory distress: Secondary | ICD-10-CM

## 2022-11-02 DIAGNOSIS — N1831 Chronic kidney disease, stage 3a: Secondary | ICD-10-CM | POA: Diagnosis present

## 2022-11-02 DIAGNOSIS — G500 Trigeminal neuralgia: Secondary | ICD-10-CM | POA: Diagnosis present

## 2022-11-02 DIAGNOSIS — I5033 Acute on chronic diastolic (congestive) heart failure: Secondary | ICD-10-CM | POA: Diagnosis present

## 2022-11-02 DIAGNOSIS — J9601 Acute respiratory failure with hypoxia: Secondary | ICD-10-CM | POA: Diagnosis not present

## 2022-11-02 DIAGNOSIS — M810 Age-related osteoporosis without current pathological fracture: Secondary | ICD-10-CM | POA: Diagnosis present

## 2022-11-02 DIAGNOSIS — I493 Ventricular premature depolarization: Secondary | ICD-10-CM | POA: Diagnosis present

## 2022-11-02 DIAGNOSIS — Z7901 Long term (current) use of anticoagulants: Secondary | ICD-10-CM

## 2022-11-02 DIAGNOSIS — J189 Pneumonia, unspecified organism: Secondary | ICD-10-CM | POA: Diagnosis present

## 2022-11-02 DIAGNOSIS — E782 Mixed hyperlipidemia: Secondary | ICD-10-CM | POA: Diagnosis present

## 2022-11-02 DIAGNOSIS — I4819 Other persistent atrial fibrillation: Secondary | ICD-10-CM | POA: Diagnosis present

## 2022-11-02 DIAGNOSIS — Z20822 Contact with and (suspected) exposure to covid-19: Secondary | ICD-10-CM | POA: Diagnosis present

## 2022-11-02 DIAGNOSIS — K5904 Chronic idiopathic constipation: Secondary | ICD-10-CM | POA: Diagnosis present

## 2022-11-02 DIAGNOSIS — Z79899 Other long term (current) drug therapy: Secondary | ICD-10-CM

## 2022-11-02 DIAGNOSIS — N390 Urinary tract infection, site not specified: Secondary | ICD-10-CM | POA: Diagnosis present

## 2022-11-02 DIAGNOSIS — Z887 Allergy status to serum and vaccine status: Secondary | ICD-10-CM

## 2022-11-02 DIAGNOSIS — I951 Orthostatic hypotension: Secondary | ICD-10-CM | POA: Diagnosis present

## 2022-11-02 DIAGNOSIS — Z8051 Family history of malignant neoplasm of kidney: Secondary | ICD-10-CM

## 2022-11-02 DIAGNOSIS — Z8249 Family history of ischemic heart disease and other diseases of the circulatory system: Secondary | ICD-10-CM

## 2022-11-02 DIAGNOSIS — E538 Deficiency of other specified B group vitamins: Secondary | ICD-10-CM | POA: Diagnosis present

## 2022-11-02 DIAGNOSIS — R0902 Hypoxemia: Principal | ICD-10-CM

## 2022-11-02 DIAGNOSIS — J441 Chronic obstructive pulmonary disease with (acute) exacerbation: Secondary | ICD-10-CM | POA: Diagnosis present

## 2022-11-02 DIAGNOSIS — I471 Supraventricular tachycardia, unspecified: Secondary | ICD-10-CM | POA: Diagnosis present

## 2022-11-02 DIAGNOSIS — R0602 Shortness of breath: Secondary | ICD-10-CM

## 2022-11-02 DIAGNOSIS — G40909 Epilepsy, unspecified, not intractable, without status epilepticus: Secondary | ICD-10-CM | POA: Diagnosis present

## 2022-11-02 DIAGNOSIS — I130 Hypertensive heart and chronic kidney disease with heart failure and stage 1 through stage 4 chronic kidney disease, or unspecified chronic kidney disease: Secondary | ICD-10-CM | POA: Diagnosis present

## 2022-11-02 DIAGNOSIS — D509 Iron deficiency anemia, unspecified: Secondary | ICD-10-CM | POA: Diagnosis present

## 2022-11-02 DIAGNOSIS — E559 Vitamin D deficiency, unspecified: Secondary | ICD-10-CM | POA: Diagnosis present

## 2022-11-02 DIAGNOSIS — H3530 Unspecified macular degeneration: Secondary | ICD-10-CM | POA: Diagnosis present

## 2022-11-02 DIAGNOSIS — K219 Gastro-esophageal reflux disease without esophagitis: Secondary | ICD-10-CM | POA: Diagnosis present

## 2022-11-02 DIAGNOSIS — K760 Fatty (change of) liver, not elsewhere classified: Secondary | ICD-10-CM | POA: Diagnosis present

## 2022-11-02 DIAGNOSIS — Z6826 Body mass index (BMI) 26.0-26.9, adult: Secondary | ICD-10-CM

## 2022-11-02 LAB — CBC WITH DIFFERENTIAL/PLATELET
Abs Immature Granulocytes: 0.04 10*3/uL (ref 0.00–0.07)
Abs Immature Granulocytes: 0.04 10*3/uL (ref 0.00–0.07)
Basophils Absolute: 0 10*3/uL (ref 0.0–0.1)
Basophils Absolute: 0 10*3/uL (ref 0.0–0.1)
Basophils Relative: 0 %
Basophils Relative: 0 %
Eosinophils Absolute: 0.1 10*3/uL (ref 0.0–0.5)
Eosinophils Absolute: 0 10*3/uL (ref 0.0–0.5)
Eosinophils Relative: 2 %
Eosinophils Relative: 0 %
HCT: 34.7 % — ABNORMAL LOW (ref 36.0–46.0)
HCT: 27.7 % — ABNORMAL LOW (ref 36.0–46.0)
Hemoglobin: 10.7 g/dL — ABNORMAL LOW (ref 12.0–15.0)
Hemoglobin: 8.6 g/dL — ABNORMAL LOW (ref 12.0–15.0)
Immature Granulocytes: 1 %
Immature Granulocytes: 1 %
Lymphocytes Relative: 25 %
Lymphocytes Relative: 5 %
Lymphs Abs: 2.2 10*3/uL (ref 0.7–4.0)
Lymphs Abs: 0.4 10*3/uL — ABNORMAL LOW (ref 0.7–4.0)
MCH: 29.9 pg (ref 26.0–34.0)
MCH: 30.4 pg (ref 26.0–34.0)
MCHC: 30.8 g/dL (ref 30.0–36.0)
MCHC: 31 g/dL (ref 30.0–36.0)
MCV: 96.9 fL (ref 80.0–100.0)
MCV: 97.9 fL (ref 80.0–100.0)
Monocytes Absolute: 1 10*3/uL (ref 0.1–1.0)
Monocytes Absolute: 0.6 10*3/uL (ref 0.1–1.0)
Monocytes Relative: 11 %
Monocytes Relative: 8 %
Neutro Abs: 5.3 10*3/uL (ref 1.7–7.7)
Neutro Abs: 6.9 10*3/uL (ref 1.7–7.7)
Neutrophils Relative %: 61 %
Neutrophils Relative %: 86 %
Platelets: 188 10*3/uL (ref 150–400)
Platelets: 149 10*3/uL — ABNORMAL LOW (ref 150–400)
RBC: 3.58 MIL/uL — ABNORMAL LOW (ref 3.87–5.11)
RBC: 2.83 MIL/uL — ABNORMAL LOW (ref 3.87–5.11)
RDW: 13.4 % (ref 11.5–15.5)
RDW: 13.4 % (ref 11.5–15.5)
WBC: 8.7 10*3/uL (ref 4.0–10.5)
WBC: 7.9 10*3/uL (ref 4.0–10.5)
nRBC: 0 % (ref 0.0–0.2)
nRBC: 0 % (ref 0.0–0.2)

## 2022-11-02 LAB — COMPREHENSIVE METABOLIC PANEL
ALT: 14 U/L (ref 0–44)
ALT: 12 U/L (ref 0–44)
AST: 19 U/L (ref 15–41)
AST: 17 U/L (ref 15–41)
Albumin: 3.6 g/dL (ref 3.5–5.0)
Albumin: 3 g/dL — ABNORMAL LOW (ref 3.5–5.0)
Alkaline Phosphatase: 117 U/L (ref 38–126)
Alkaline Phosphatase: 101 U/L (ref 38–126)
Anion gap: 9 (ref 5–15)
Anion gap: 8 (ref 5–15)
BUN: 19 mg/dL (ref 8–23)
BUN: 19 mg/dL (ref 8–23)
CO2: 29 mmol/L (ref 22–32)
CO2: 26 mmol/L (ref 22–32)
Calcium: 8.3 mg/dL — ABNORMAL LOW (ref 8.9–10.3)
Calcium: 7.7 mg/dL — ABNORMAL LOW (ref 8.9–10.3)
Chloride: 104 mmol/L (ref 98–111)
Chloride: 108 mmol/L (ref 98–111)
Creatinine, Ser: 1.03 mg/dL — ABNORMAL HIGH (ref 0.44–1.00)
Creatinine, Ser: 1.05 mg/dL — ABNORMAL HIGH (ref 0.44–1.00)
GFR, Estimated: 52 mL/min — ABNORMAL LOW (ref >60)
GFR, Estimated: 50 mL/min — ABNORMAL LOW (ref >60)
Glucose, Bld: 127 mg/dL — ABNORMAL HIGH (ref 70–99)
Glucose, Bld: 159 mg/dL — ABNORMAL HIGH (ref 70–99)
Potassium: 3.2 mmol/L — ABNORMAL LOW (ref 3.5–5.1)
Potassium: 2.7 mmol/L — CL (ref 3.5–5.1)
Sodium: 142 mmol/L (ref 135–145)
Sodium: 142 mmol/L (ref 135–145)
Total Bilirubin: 0.7 mg/dL (ref 0.3–1.2)
Total Bilirubin: 0.7 mg/dL (ref 0.3–1.2)
Total Protein: 6.8 g/dL (ref 6.5–8.1)
Total Protein: 6 g/dL — ABNORMAL LOW (ref 6.5–8.1)

## 2022-11-02 LAB — LACTIC ACID, PLASMA
Lactic Acid, Venous: 1.8 mmol/L (ref 0.5–1.9)
Lactic Acid, Venous: 1.5 mmol/L (ref 0.5–1.9)

## 2022-11-02 LAB — PROCALCITONIN: Procalcitonin: <0.1 ng/mL

## 2022-11-02 LAB — BLOOD GAS, VENOUS
Acid-Base Excess: 4.3 mmol/L — ABNORMAL HIGH (ref 0.0–2.0)
Bicarbonate: 31.5 mmol/L — ABNORMAL HIGH (ref 20.0–28.0)
O2 Saturation: 72.4 %
Patient temperature: 37
pCO2, Ven: 57 mmHg (ref 44–60)
pH, Ven: 7.35 (ref 7.25–7.43)
pO2, Ven: 42 mmHg (ref 32–45)

## 2022-11-02 LAB — POTASSIUM: Potassium: 3.1 mmol/L — ABNORMAL LOW (ref 3.5–5.1)

## 2022-11-02 LAB — MAGNESIUM
Magnesium: 1.7 mg/dL (ref 1.7–2.4)
Magnesium: 2.1 mg/dL (ref 1.7–2.4)

## 2022-11-02 LAB — RESP PANEL BY RT-PCR (RSV, FLU A&B, COVID)  RVPGX2
Influenza A by PCR: NEGATIVE
Influenza B by PCR: NEGATIVE
Resp Syncytial Virus by PCR: NEGATIVE
SARS Coronavirus 2 by RT PCR: NEGATIVE

## 2022-11-02 LAB — TROPONIN I (HIGH SENSITIVITY)
Troponin I (High Sensitivity): 17 ng/L (ref <18)
Troponin I (High Sensitivity): 20 ng/L — ABNORMAL HIGH (ref <18)

## 2022-11-02 LAB — BRAIN NATRIURETIC PEPTIDE: B Natriuretic Peptide: 524.7 pg/mL — ABNORMAL HIGH (ref 0.0–100.0)

## 2022-11-02 MED ORDER — PREDNISONE 20 MG PO TABS: 40.0000 mg | ORAL_TABLET | Freq: Every day | ORAL | Status: DC

## 2022-11-02 MED ORDER — METHYLPREDNISOLONE SODIUM SUCC 125 MG IJ SOLR: 125.0000 mg | Freq: Two times a day (BID) | INTRAMUSCULAR | Status: DC

## 2022-11-02 MED ORDER — POTASSIUM CHLORIDE 10 MEQ/100ML IV SOLN
10.0000 meq | INTRAVENOUS | Status: DC
  Administered 2022-11-02 (×3): 10 meq via INTRAVENOUS
  Filled 2022-11-02 (×2): qty 100

## 2022-11-02 MED ORDER — ONDANSETRON HCL 4 MG/2ML IJ SOLN: 4.0000 mg | Freq: Four times a day (QID) | INTRAMUSCULAR | Status: DC | PRN

## 2022-11-02 MED ORDER — MORPHINE SULFATE (PF) 2 MG/ML IV SOLN: 2.0000 mg | INTRAVENOUS | Status: DC | PRN

## 2022-11-02 MED ORDER — APIXABAN 5 MG PO TABS
5.0000 mg | ORAL_TABLET | Freq: Two times a day (BID) | ORAL | Status: DC
  Administered 2022-11-02 – 2022-11-04 (×5): 5 mg via ORAL
  Filled 2022-11-02 (×5): qty 1

## 2022-11-02 MED ORDER — FLUTICASONE FUROATE-VILANTEROL 100-25 MCG/ACT IN AEPB: 1.0000 | INHALATION_SPRAY | Freq: Every day | RESPIRATORY_TRACT | Status: DC

## 2022-11-02 MED ORDER — AMIODARONE HCL 200 MG PO TABS
200.0000 mg | ORAL_TABLET | Freq: Two times a day (BID) | ORAL | Status: DC
  Administered 2022-11-02 – 2022-11-04 (×5): 200 mg via ORAL
  Filled 2022-11-02 (×5): qty 1

## 2022-11-02 MED ORDER — FLUDROCORTISONE ACETATE 0.1 MG PO TABS
200.0000 ug | ORAL_TABLET | Freq: Two times a day (BID) | ORAL | Status: DC
  Administered 2022-11-02 – 2022-11-04 (×5): 200 ug via ORAL
  Filled 2022-11-02 (×5): qty 2

## 2022-11-02 MED ORDER — PHENYTOIN SODIUM EXTENDED 100 MG PO CAPS
200.0000 mg | ORAL_CAPSULE | ORAL | Status: DC
  Administered 2022-11-03: 200 mg via ORAL
  Filled 2022-11-02: qty 2

## 2022-11-02 MED ORDER — IPRATROPIUM-ALBUTEROL 0.5-2.5 (3) MG/3ML IN SOLN
3.0000 mL | Freq: Four times a day (QID) | RESPIRATORY_TRACT | Status: DC
  Administered 2022-11-02 (×4): 3 mL via RESPIRATORY_TRACT
  Filled 2022-11-02 (×4): qty 3

## 2022-11-02 MED ORDER — GABAPENTIN 300 MG PO CAPS
300.0000 mg | ORAL_CAPSULE | Freq: Two times a day (BID) | ORAL | Status: DC
  Administered 2022-11-02 – 2022-11-04 (×5): 300 mg via ORAL
  Filled 2022-11-02 (×5): qty 1

## 2022-11-02 MED ORDER — ACETAMINOPHEN 325 MG PO TABS
650.0000 mg | ORAL_TABLET | Freq: Four times a day (QID) | ORAL | Status: DC | PRN
  Administered 2022-11-02 – 2022-11-03 (×2): 650 mg via ORAL
  Filled 2022-11-02 (×2): qty 2

## 2022-11-02 MED ORDER — PHENYTOIN SODIUM EXTENDED 100 MG PO CAPS
100.0000 mg | ORAL_CAPSULE | Freq: Every day | ORAL | Status: DC
  Administered 2022-11-02 – 2022-11-03 (×2): 100 mg via ORAL
  Filled 2022-11-02 (×2): qty 1

## 2022-11-02 MED ORDER — PANTOPRAZOLE SODIUM 40 MG PO TBEC
40.0000 mg | DELAYED_RELEASE_TABLET | Freq: Every day | ORAL | Status: DC
  Administered 2022-11-02 – 2022-11-04 (×3): 40 mg via ORAL
  Filled 2022-11-02 (×3): qty 1

## 2022-11-02 MED ORDER — MIDODRINE HCL 5 MG PO TABS
10.0000 mg | ORAL_TABLET | Freq: Three times a day (TID) | ORAL | Status: DC
  Administered 2022-11-02 – 2022-11-04 (×5): 10 mg via ORAL
  Filled 2022-11-02 (×5): qty 2

## 2022-11-02 MED ORDER — PHENYTOIN SODIUM EXTENDED 100 MG PO CAPS
100.0000 mg | ORAL_CAPSULE | ORAL | Status: DC
  Administered 2022-11-02 – 2022-11-04 (×2): 100 mg via ORAL
  Filled 2022-11-02 (×2): qty 1

## 2022-11-02 MED ORDER — METHYLPREDNISOLONE SODIUM SUCC 125 MG IJ SOLR
125.0000 mg | Freq: Two times a day (BID) | INTRAMUSCULAR | Status: DC
  Administered 2022-11-02: 125 mg via INTRAVENOUS
  Filled 2022-11-02: qty 2

## 2022-11-02 MED ORDER — MIDODRINE HCL 5 MG PO TABS: 10.0000 mg | ORAL_TABLET | Freq: Three times a day (TID) | ORAL | Status: DC

## 2022-11-02 MED ORDER — AZITHROMYCIN 250 MG PO TABS
500.0000 mg | ORAL_TABLET | Freq: Every day | ORAL | Status: AC
  Administered 2022-11-02 – 2022-11-03 (×2): 500 mg via ORAL
  Filled 2022-11-02 (×2): qty 2

## 2022-11-02 MED ORDER — METHYLPREDNISOLONE SODIUM SUCC 125 MG IJ SOLR
125.0000 mg | Freq: Once | INTRAMUSCULAR | Status: AC
  Administered 2022-11-02: 125 mg via INTRAVENOUS
  Filled 2022-11-02: qty 2

## 2022-11-02 MED ORDER — POTASSIUM CHLORIDE CRYS ER 20 MEQ PO TBCR
40.0000 meq | EXTENDED_RELEASE_TABLET | Freq: Once | ORAL | Status: AC
  Administered 2022-11-02: 40 meq via ORAL
  Filled 2022-11-02: qty 2

## 2022-11-02 MED ORDER — MAGNESIUM SULFATE 2 GM/50ML IV SOLN
2.0000 g | Freq: Once | INTRAVENOUS | Status: AC
  Administered 2022-11-02: 2 g via INTRAVENOUS
  Filled 2022-11-02: qty 50

## 2022-11-02 MED ORDER — SODIUM CHLORIDE 0.9 % IV SOLN
500.0000 mg | Freq: Once | INTRAVENOUS | Status: AC
  Administered 2022-11-02: 500 mg via INTRAVENOUS
  Filled 2022-11-02: qty 5

## 2022-11-02 MED ORDER — ATORVASTATIN CALCIUM 10 MG PO TABS
10.0000 mg | ORAL_TABLET | Freq: Every day | ORAL | Status: DC
  Administered 2022-11-02 – 2022-11-04 (×3): 10 mg via ORAL
  Filled 2022-11-02 (×3): qty 1

## 2022-11-02 MED ORDER — IPRATROPIUM-ALBUTEROL 0.5-2.5 (3) MG/3ML IN SOLN
3.0000 mL | Freq: Once | RESPIRATORY_TRACT | Status: AC
  Administered 2022-11-02: 3 mL via RESPIRATORY_TRACT
  Filled 2022-11-02: qty 3

## 2022-11-02 MED ORDER — FLUTICASONE FUROATE-VILANTEROL 100-25 MCG/ACT IN AEPB
1.0000 | INHALATION_SPRAY | Freq: Every day | RESPIRATORY_TRACT | Status: DC
  Administered 2022-11-03 – 2022-11-04 (×2): 1 via RESPIRATORY_TRACT
  Filled 2022-11-02: qty 28

## 2022-11-02 MED ORDER — SODIUM CHLORIDE 0.9 % IV SOLN: 1.0000 g | INTRAVENOUS | Status: DC

## 2022-11-02 MED ORDER — ACETAMINOPHEN 650 MG RE SUPP: 650.0000 mg | Freq: Four times a day (QID) | RECTAL | Status: DC | PRN

## 2022-11-02 MED ORDER — ALBUTEROL SULFATE (2.5 MG/3ML) 0.083% IN NEBU: 2.5000 mg | INHALATION_SOLUTION | RESPIRATORY_TRACT | Status: DC | PRN

## 2022-11-02 MED ORDER — SODIUM CHLORIDE 0.9 % IV SOLN
1.0000 g | Freq: Once | INTRAVENOUS | Status: AC
  Administered 2022-11-02: 1 g via INTRAVENOUS
  Filled 2022-11-02: qty 10

## 2022-11-02 MED ORDER — AZITHROMYCIN 250 MG PO TABS: 500.0000 mg | ORAL_TABLET | Freq: Every day | ORAL | Status: DC

## 2022-11-02 MED ORDER — OXYCODONE HCL 5 MG PO TABS: 5.0000 mg | ORAL_TABLET | ORAL | Status: DC | PRN

## 2022-11-02 MED ORDER — ALPRAZOLAM 0.25 MG PO TABS: 0.2500 mg | ORAL_TABLET | Freq: Three times a day (TID) | ORAL | Status: DC | PRN

## 2022-11-02 MED ORDER — FUROSEMIDE 10 MG/ML IJ SOLN
20.0000 mg | Freq: Once | INTRAMUSCULAR | Status: AC
  Administered 2022-11-02: 20 mg via INTRAVENOUS
  Filled 2022-11-02: qty 4

## 2022-11-02 MED ORDER — SODIUM CHLORIDE 0.9 % IV SOLN
1.0000 g | INTRAVENOUS | Status: DC
  Administered 2022-11-02 – 2022-11-03 (×2): 1 g via INTRAVENOUS
  Filled 2022-11-02 (×3): qty 10

## 2022-11-02 MED ORDER — ONDANSETRON HCL 4 MG PO TABS: 4.0000 mg | ORAL_TABLET | Freq: Four times a day (QID) | ORAL | Status: DC | PRN

## 2022-11-02 MED ORDER — PHENYTOIN SODIUM EXTENDED 100 MG PO CAPS: 100.0000 mg | ORAL_CAPSULE | Freq: Two times a day (BID) | ORAL | Status: DC

## 2022-11-02 NOTE — ED Notes (Signed)
Pt taken off bipap at this time. O2 97%

## 2022-11-02 NOTE — H&P (Signed)
History and Physical    Patient: Bergan Moulton VZS:[id number] DOB: January 22, 1930 DOA: 11/02/2022 DOS: the patient was seen and examined on 11/02/2022 PCP: [facility]  Patient coming from: Home  Chief Complaint:  Chief Complaint  Patient presents with   Shortness of Breath   HPI: Rayniya Bringas is a 88 y.o. female with medical history significant of hypertension, paroxysmal atrial fibrillation, GERD, hyperlipidemia, and more presents to ED with a chief complaint of dyspnea.  Unfortunately at the my exam patient is not able to provide any history given that she is exhausted, but also that she is on BiPAP and communication is severely limited.  Patient recently had attempted cardioversion secondary to atrial fibrillation on 10/31/2022.  She is back in atrial fibrillation at this time.  She reports she has had progressive shortness of breath since the procedure.  Imaging shows possible pneumonia, procalcitonin is less than 0.10.  Patient had reported wheezing at home.  EMS gave 2 DuoNeb's on the way to the hospital with the patient.  She was still wheezing on arrival.  She was satting at 90% on room air.  Patient was not able to communicate any further complaints.  Due to communication being severely limited, will default to full code. Review of Systems: unable to review all systems due to the inability of the patient to answer questions. Past Medical History:  Diagnosis Date   Hypertension    Paroxysmal A-fib [facility])    Past Surgical History:  Procedure Laterality Date   APPENDECTOMY     CARDIOVERSION N/A 10/31/2022   Procedure: CARDIOVERSION;  Surgeon: Antonieta Iba, MD;  Location: [facility] ORS;  Service: Cardiovascular;  Laterality: N/A;   KNEE SURGERY     Social History:  reports that she has quit smoking. She has never used smokeless tobacco. She reports that she does not currently use alcohol. She reports that she does not currently use drugs.  Allergies  Allergen  Reactions   Pneumovax [Pneumococcal Polysaccharide Vaccine]     Other reaction(s): Severe arm swelling   Pneumococcal Vaccine     Other reaction(s): Unknown    Family History  Problem Relation Age of Onset   Kidney cancer Sister    Intracerebral hemorrhage Brother     Prior to Admission medications   Medication Sig Start Date End Date Taking? Authorizing Provider  amiodarone (PACERONE) 200 MG tablet Take 1 tablet (200 mg total) by mouth 2 (two) times daily. 10/09/22  Yes Furth, Cadence H, PA-C  atorvastatin (LIPITOR) 10 MG tablet Take 10 mg by mouth daily. 12/11/21  Yes [provider]  Calcium Carb-Cholecalciferol (CALCIUM 600/VITAMIN D PO) Take 1 tablet by mouth in the morning and at bedtime.   Yes [provider]  ELIQUIS 5 MG TABS tablet Take 5 mg by mouth 2 (two) times daily. 12/18/21  Yes [provider]  ferrous sulfate 324 MG TBEC Take 324 mg by mouth 3 (three) times a week. Take 1 tablet every morning, and 1 tablet in the evening every Wednesday and Saturday   Yes [provider]  fludrocortisone (FLORINEF) 0.1 MG tablet Take 2 tablets by mouth twice daily 08/12/22  Yes Gollan, Tollie Pizza, MD  gabapentin (NEURONTIN) 300 MG capsule Take 300 mg by mouth 2 (two) times daily. 12/12/21  Yes [provider]  midodrine (PROAMATINE) 10 MG tablet Take 10 mg by mouth 3 (three) times daily. 04/12/22  Yes [provider]  omeprazole (PRILOSEC) 20 MG capsule Take 20 mg by mouth  daily.   Yes [provider]  phenytoin (DILANTIN) 100 MG ER capsule Take 100-200 mg by mouth 2 (two) times daily. Take 200 mg every other morning alternating with 100 mg, and 100 mg capsule daily in the evening 11/22/21  Yes [provider]  senna (SENOKOT) 8.6 MG TABS tablet Take 1 tablet by mouth daily as needed for mild constipation.   Yes [provider]  potassium chloride SA (KLOR-CON M) 20 MEQ tablet Take 1 tablet (20 meq) by mouth once  daily x 3 days Patient not taking: Reported on 11/02/2022 10/28/22   Minna Merritts, MD    Physical Exam: Vitals:   11/02/22 0200 11/02/22 0230 11/02/22 0243 11/02/22 0300  BP: (!) 107/43 (!) 98/58  (!) 101/48  Pulse: 88 88  85  Resp: 17 (!) 21    Temp:   99.2 F (37.3 C)   TempSrc:   Axillary   SpO2: 98% 100%  100%  Weight:      Height:       1.  General: Patient lying supine in bed,  no acute distress   2. Psychiatric: Alert and oriented x 3, mood and behavior normal for situation, pleasant and cooperative with exam   3. Neurologic: Speech and language are normal, face is symmetric, moves all 4 extremities voluntarily, at baseline without acute deficits on limited exam   4. HEENMT:  Head is atraumatic, normocephalic, pupils reactive to light, neck is supple, trachea is midline, mucous membranes are moist   5. Respiratory : Mild wheezing still present bilaterally without rhonchi, rales, no cyanosis, no increase in work of breathing or accessory muscle use   6. Cardiovascular : Heart rate tachycardic rhythm is irregular, no murmurs, rubs or gallops, no peripheral edema, peripheral pulses palpated   7. Gastrointestinal:  Abdomen is soft, nondistended, nontender to palpation bowel sounds active, no masses or organomegaly palpated   8. Skin:  Skin is warm, dry and intact without rashes, acute lesions, or ulcers on limited exam   9.Musculoskeletal:  No acute deformities or trauma, no asymmetry in tone, no peripheral edema, peripheral pulses palpated, no tenderness to palpation in the extremities  Data Reviewed: In the ED Over 43-1 36 over, satting at 100% on 40% FiO2 BiPAP pH 7.35, pCO2 57 No leukocytosis with white blood cell count of 8.7, hemoglobin 10.1 Mild hypokalemia 3.2 Chemistry Procalcitonin less than 0.10 Negative COVID and flu Blood culture pending EKG shows A-fib with heart rate 98 Patient was started on Rocephin and Zithromax given several DuoNebs,  mag and Solu-Medrol Chest x-ray shows possible patchy opacities left lower lobe predominant cyst patient is for pneumonia Admission was requested for acute respiratory failure with hypoxia and dyspnea requiring BiPAP Assessment and Plan: * Acute respiratory failure with hypoxia ([place]) -Unclear etiology - Chest x-ray showed possible pneumonia, but procalcitonin is less than 0.10 - Possibly a viral pneumonia and it is not COVID or flu? -Blood culture pending - rocephin and zithromax started in the ed, but not continued given low procalcitonin - Continue oxygen supplementation as needed - Patient currently on BiPAP and tolerating it well - EKG shows A-fib without acute ischemic changes with a heart rate of 98 - Patient was given magnesium, Solu-Medrol, Rocephin, Zithromax, DuoNeb in the ED - Continue steroids, breathing treatments, and BiPAP - Raise concern for heart failure given recent attempted ablation, and progressive dyspnea - Repeat chest x-ray in the a.m. to monitor for effusion or pulmonary edema  Hyperlipidemia - Continue  statin  Persistent atrial fibrillation (HCC) - With attempted ablation on 1214 - Progressive shortness of breath since then - Monitor on telemetry - Continue amiodarone, and Eliquis  Hypokalemia - 4 rounds of potassium ordered - Trend in the a.m. - Monitor on telemetry      Advance Care Planning:   Code Status: Full Code   Consults: None  Family Communication: No family at bedside  Severity of Illness: The appropriate patient status for this patient is OBSERVATION. Observation status is judged to be reasonable and necessary in order to provide the required intensity of service to ensure the patient's safety. The patient's presenting symptoms, physical exam findings, and initial radiographic and laboratory data in the context of their medical condition is felt to place them at decreased risk for further clinical deterioration. Furthermore, it is  anticipated that the patient will be medically stable for discharge from the hospital within 2 midnights of admission.   Author: Lilyan Gilford, DO 11/02/2022 5:28 AM  For on call review [email or web address].

## 2022-11-02 NOTE — Assessment & Plan Note (Addendum)
-  Unclear etiology - Chest x-ray showed possible pneumonia, but procalcitonin is less than 0.10 - Possibly a viral pneumonia and it is not COVID or flu? -Blood culture pending - rocephin and zithromax started in the ed, but not continued given low procalcitonin - Continue oxygen supplementation as needed - Patient currently on BiPAP and tolerating it well - EKG shows A-fib without acute ischemic changes with a heart rate of 98 - Patient was given magnesium, Solu-Medrol, Rocephin, Zithromax, DuoNeb in the ED - Continue steroids, breathing treatments, and BiPAP - Raise concern for heart failure given recent attempted ablation, and progressive dyspnea - Repeat chest x-ray in the a.m. to monitor for effusion or pulmonary edema

## 2022-11-02 NOTE — Assessment & Plan Note (Signed)
-   4 rounds of potassium ordered - Trend in the a.m. - Monitor on telemetry

## 2022-11-02 NOTE — Assessment & Plan Note (Signed)
-   With attempted ablation on 1214 - Progressive shortness of breath since then - Monitor on telemetry - Continue amiodarone, and Eliquis

## 2022-11-02 NOTE — Progress Notes (Signed)
Progress Note   Patient: Tricia Edwards AYT:[id number] DOB: May 14, 1930 DOA: 11/02/2022     0 DOS: the patient was seen and examined on 11/02/2022   Brief hospital course: 88 year old female with a PMH of Asthma, Chronic Diastolic Heart Failure with EF 60-65%, Persistent Atrial Fibrillation s/p 12/14 DCCV with recurrence presented to the ED on 12/17 with palpitations and shortness of breath found to be in Afib.  Assessment and Plan: Chronic Persistent Atrial Fibrillation s/p 12/14 DCCV with recurrence: Shortly after cardioversion, there is a transient shortening of atrial refractoriness and re-initiation of Afib can occur due to premature beats and atrial remodeling.  Patient's age and comorbidities put her at high risk of hyperacute recurrence.  This should be paroxysmal not persistent. - So far, heart rate has been stable on telemetry. - Continue Amiodarone for rhythm control and Eliquis for anticoagulation. - Discharge home with Holter monitor. - Follow up with Cardiology outpatient.  Hypokalemia, replaced: - Monitor K and Mg.  Acute Hypoxic Respiratory Failure, resolved: Oxygen saturation is normal on room air.  I believe anxiety is a large component of patient's symptoms. - Give Xanax 0.25 mg TID PRN. - There is no need for steroids.  Chronic Diastolic Heart Failure / Small Bilateral Pleural Effusions: - Give IV Lasix 20 mg x once. - Monitor BP as it runs low and patient is chronically on Midodrine 10 mg TID.  Right sided Pneumonia: Patient has cough and dyspnea.  Chest CT confirms infiltrates on the right side.  I will treat this as pneumonia. - S/P Ceftriaxone and Azithromycin x once.  Continue antibiotics and treat for 5-7 days depending on clinical improvement.  Hypotension: - Midodrine 10 mg TID and Fludrocortisone.  Seizure Disorder: - Dilantin.  Hyperlipidemia: - Statin.  Physical Deconditioning: Patient is able to walk to and from the bathroom with assistance. -  Consult PT/OT.      Subjective: Ms. Audi is resting comfortably in bed.  Heart rate is stable.  She denies any chest pain or shortness of breath.    Ms. Funderburke tells me she is on her way to [facility] and would like to get there as soon as possible.  Physical Exam: Vitals:   11/02/22 1317 11/02/22 1533 11/02/22 1614 11/02/22 1622  BP:  (!) 124/106  (!) 150/68  Pulse:  (!) 51 90 91  Resp:  16    Temp: 98.5 F (36.9 C) 98.1 F (36.7 C)    TempSrc: Oral     SpO2:  (!) 87% 93%   Weight:      Height:       Examination: General exam: chronically ill appearing, fatigued, a little anxious HEENT: NCAT, PERRL Respiratory system: mild bibasilar crackles Cardiovascular system: Did not appreciate a murmur, regular, No JVD. Gastrointestinal system: No flank pain, Abdomen soft, NT,ND, BS+. Nervous System: No focal deficits. Extremities: No edema, distal peripheral pulses palpable.  Skin: scattered bruises, no rashes. MSK: Physical Deconditioning  Data Reviewed:  There are no new results to review at this time.  CT CHEST WO CONTRAST CLINICAL DATA:  88 year old female with shortness of breath. History of atrial fibrillation.  EXAM: CT CHEST WITHOUT CONTRAST  TECHNIQUE: Multidetector CT imaging of the chest was performed following the standard protocol without IV contrast.  RADIATION DOSE REDUCTION: This exam was performed according to the departmental dose-optimization program which includes automated exposure control, adjustment of the mA and/or kV according to patient size and/or use of iterative reconstruction technique.  COMPARISON:  11/02/2022  prior radiographs  FINDINGS: Cardiovascular: Cardiomegaly noted. Coronary artery and aortic atherosclerotic calcifications noted. There is no evidence of thoracic aortic aneurysm or pericardial effusion.  Mediastinum/Nodes: No enlarged mediastinal or axillary lymph nodes. The visualized thyroid gland, trachea, and esophagus  demonstrate no significant findings.  Lungs/Pleura: Small bilateral pleural effusions and mild to moderate bibasilar opacities/probable atelectasis noted. There is no evidence of discrete mass, suspicious nodule or pneumothorax.  Upper Abdomen: No acute abnormality  Musculoskeletal: No acute or suspicious bony abnormalities are noted.  IMPRESSION: 1. Small bilateral pleural effusions and mild to moderate bibasilar opacities/probable atelectasis. 2. Cardiomegaly and coronary artery disease. 3.  Aortic Atherosclerosis (ICD10-I70.0).  Electronically Signed   By: Margarette Canada M.D.   On: 11/02/2022 14:12 DG Chest Port 1 View CLINICAL DATA:  Dyspnea  EXAM: PORTABLE CHEST 1 VIEW  COMPARISON:  Previous studies including the examination done earlier today  FINDINGS: Transverse diameter of heart is increased. Increased interstitial markings are seen in parahilar regions and lower lung fields. There is interval increase in infiltrates in left lower lung field. Lateral CP angles are clear. There is no pneumothorax.  IMPRESSION: Increased interstitial and alveolar densities in both lungs suggest pulmonary edema and possibly multifocal pneumonia.  Electronically Signed   By: Elmer Picker M.D.   On: 11/02/2022 08:14 DG Chest Port 1 View CLINICAL DATA:  Shortness of breath  EXAM: PORTABLE CHEST 1 VIEW  COMPARISON:  09/08/2022  FINDINGS: Multifocal patchy opacities, left lower lobe predominant, suspicious for pneumonia. No pleural effusion or pneumothorax.  The heart is top-normal in size.  IMPRESSION: Multifocal patchy opacities, left lower lobe predominant, suspicious for pneumonia.  Electronically Signed   By: Julian Hy M.D.   On: 11/02/2022 01:27    Family Communication: I spoke with son at bedside.  Disposition: Status is: Observation The patient remains OBS appropriate and will d/c before 2 midnights.  Planned Discharge Destination:  Home    Time spent: >30 minutes  Author: George Hugh, MD 11/02/2022 4:41 PM  For on call review [email or web address].

## 2022-11-02 NOTE — Assessment & Plan Note (Signed)
Continue statin. 

## 2022-11-02 NOTE — ED Provider Notes (Signed)
[facility] Provider Note    Event Date/Time   First MD Initiated Contact with Patient 11/02/22 [id number]     (approximate)   History   Shortness of Breath   HPI  Tricia Edwards is a 88 y.o. female brought to the ED via EMS from home with a chief complaint of respiratory distress.  Patient had cardioversion yesterday for paroxysmal atrial fibrillation.  Presents with acute respiratory distress tonight.  EMS reports room air saturation 90%.  Unable to establish IV.  Arrives performing nebulizer treatment.  Denies fever, cough, chest pain, abdominal pain, nausea, vomiting or dizziness.  Audible wheezing on examination.     Past Medical History   Past Medical History:  Diagnosis Date   Hypertension    Paroxysmal A-fib [facility](HCC)      Active Problem List   Patient Active Problem List   Diagnosis Date Noted   Acute respiratory failure with hypoxia (HCC) 11/02/2022   Persistent atrial fibrillation (HCC) 10/31/2022   SOB (shortness of breath) 10/31/2022   Acute on chronic diastolic CHF (congestive heart failure) (HCC) 05/23/2022   Atrial fibrillation with RVR (HCC) 05/22/2022   Chronic kidney disease, stage 3a (HCC) 05/22/2022   Iron deficiency anemia 05/22/2022   Syncope 05/22/2022   Elevated troponin    UTI (urinary tract infection) 01/08/2022   Hepatic steatosis 12/04/2021   Chronic idiopathic constipation 08/21/2021   Chronic diastolic congestive heart failure (HCC) 05/25/2021   Paroxysmal atrial fibrillation (HCC) 05/25/2021   Hypokalemia 01/18/2021   Chronic orthostatic hypotension 01/18/2021   History of trigeminal neuralgia 01/18/2021   Dizziness 08/08/2020   Osteoporosis 12/30/2018   Vitamin D deficiency 12/22/2018   Syncope and collapse 03/15/2018   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/09/2018   Unspecified abdominal hernia without obstruction or gangrene 05/05/2017   Gastro-esophageal reflux disease with esophagitis 03/10/2017   Anemia,  unspecified 02/06/2017   Macular degeneration, dry 01/01/2017   Pseudophakia of right eye 01/01/2017   Vitamin B12 deficiency 01/01/2017   Hyponatremia 08/22/2016   Trigeminal neuralgia 03/24/2016   Radiculopathy, cervical region 03/24/2016   Atherosclerotic heart disease of native coronary artery without angina pectoris 11/08/2015   Stage 3 chronic kidney disease (HCC) 10/29/2013   Spondylosis without myelopathy or radiculopathy, site unspecified 06/28/2013   Nuclear senile cataract 01/07/2013   Obesity 08/09/2010   Benign paroxysmal positional vertigo 10/18/2008   Other dorsalgia 12/04/2005   Hyperlipidemia 09/23/2004     Past Surgical History   Past Surgical History:  Procedure Laterality Date   APPENDECTOMY     CARDIOVERSION N/A 10/31/2022   Procedure: CARDIOVERSION;  Surgeon: Antonieta IbaGollan, Timothy J, MD;  Location: [facility] ORS;  Service: Cardiovascular;  Laterality: N/A;   KNEE SURGERY       Home Medications   Prior to Admission medications   Medication Sig Start Date End Date Taking? Authorizing Provider  amiodarone (PACERONE) 200 MG tablet Take 1 tablet (200 mg total) by mouth 2 (two) times daily. 10/09/22  Yes Furth, Cadence H, PA-C  atorvastatin (LIPITOR) 10 MG tablet Take 10 mg by mouth daily. 12/11/21  Yes [provider]  Calcium Carb-Cholecalciferol (CALCIUM 600/VITAMIN D PO) Take 1 tablet by mouth in the morning and at bedtime.   Yes [provider]  ELIQUIS 5 MG TABS tablet Take 5 mg by mouth 2 (two) times daily. 12/18/21  Yes [provider]  ferrous sulfate 324 MG TBEC Take 324 mg by mouth 3 (three) times a week. Take 1 tablet every morning, and 1  tablet in the evening every Wednesday and Saturday   Yes [provider]  fludrocortisone (FLORINEF) 0.1 MG tablet Take 2 tablets by mouth twice daily 08/12/22  Yes Gollan, Tollie Pizza, MD  gabapentin (NEURONTIN) 300 MG capsule Take 300 mg by mouth 2 (two) times daily. 12/12/21  Yes [provider]  midodrine (PROAMATINE) 10 MG tablet Take 10 mg by mouth 3 (three) times daily. 04/12/22  Yes [provider]  omeprazole (PRILOSEC) 20 MG capsule Take 20 mg by mouth daily.   Yes [provider]  phenytoin (DILANTIN) 100 MG ER capsule Take 100-200 mg by mouth 2 (two) times daily. Take 200 mg every other morning alternating with 100 mg, and 100 mg capsule daily in the evening 11/22/21  Yes [provider]  senna (SENOKOT) 8.6 MG TABS tablet Take 1 tablet by mouth daily as needed for mild constipation.   Yes [provider]  potassium chloride SA (KLOR-CON M) 20 MEQ tablet Take 1 tablet (20 meq) by mouth once daily x 3 days Patient not taking: Reported on 11/02/2022 10/28/22   Antonieta Iba, MD     Allergies  Pneumovax [pneumococcal polysaccharide vaccine] and Pneumococcal vaccine   Family History   Family History  Problem Relation Age of Onset   Kidney cancer Sister    Intracerebral hemorrhage Brother      Physical Exam  Triage Vital Signs: ED Triage Vitals  Enc Vitals Group     BP      Pulse      Resp      Temp      Temp src      SpO2      Weight      Height      Head Circumference      Peak Flow      Pain Score      Pain Loc      Pain Edu?      Excl. in GC?     Updated Vital Signs: BP (!) 101/48   Pulse 85   Temp 99.2 F (37.3 C) (Axillary)   Resp (!) 21   Ht 5\' 7"  (1.702 m)   Wt 75.8 kg   SpO2 98%   BMI 26.16 kg/m    General: Awake, moderate distress.  CV:  Normal rate, irregular rhythm.  Good peripheral perfusion.  Resp:  Increased effort.  Tripod positioning.  Retractions.  Audible wheezing. Abd:  Nontender.  No distention.  Other:  Supple calves, not swollen.   ED Results / Procedures / Treatments  Labs (all labs ordered are listed, but only abnormal results are displayed) Labs Reviewed  CBC WITH DIFFERENTIAL/PLATELET - Abnormal; Notable for the following components:      Result Value   RBC  3.58 (*)    Hemoglobin 10.7 (*)    HCT 34.7 (*)    All other components within normal limits  COMPREHENSIVE METABOLIC PANEL - Abnormal; Notable for the following components:   Potassium 3.2 (*)    Glucose, Bld 127 (*)    Creatinine, Ser 1.03 (*)    Calcium 8.3 (*)    GFR, Estimated 52 (*)    All other components within normal limits  BRAIN NATRIURETIC PEPTIDE - Abnormal; Notable for the following components:   B Natriuretic Peptide 524.7 (*)    All other components within normal limits  BLOOD GAS, VENOUS - Abnormal; Notable for the following components:   Bicarbonate 31.5 (*)    Acid-Base Excess  4.3 (*)    All other components within normal limits  COMPREHENSIVE METABOLIC PANEL - Abnormal; Notable for the following components:   Potassium 2.7 (*)    Glucose, Bld 159 (*)    Creatinine, Ser 1.05 (*)    Calcium 7.7 (*)    Total Protein 6.0 (*)    Albumin 3.0 (*)    GFR, Estimated 50 (*)    All other components within normal limits  CBC WITH DIFFERENTIAL/PLATELET - Abnormal; Notable for the following components:   RBC 2.83 (*)    Hemoglobin 8.6 (*)    HCT 27.7 (*)    Platelets 149 (*)    Lymphs Abs 0.4 (*)    All other components within normal limits  POTASSIUM - Abnormal; Notable for the following components:   Potassium 3.1 (*)    All other components within normal limits  TROPONIN I (HIGH SENSITIVITY) - Abnormal; Notable for the following components:   Troponin I (High Sensitivity) 20 (*)    All other components within normal limits  RESP PANEL BY RT-PCR (RSV, FLU A&B, COVID)  [id number]  CULTURE, BLOOD (ROUTINE X 2)  CULTURE, BLOOD (ROUTINE X 2)  EXPECTORATED SPUTUM ASSESSMENT W GRAM STAIN, RFLX TO RESP C  MAGNESIUM  LACTIC ACID, PLASMA  LACTIC ACID, PLASMA  PROCALCITONIN  MAGNESIUM  TROPONIN I (HIGH SENSITIVITY)     EKG  ED ECG REPORT I, Grayling Schranz J, the attending physician, personally viewed and interpreted this ECG.   Date: 11/02/2022  EKG Time: 0059  Rate:  98  Rhythm: atrial fibrillation, rate 98  Axis: LAD  Intervals:none  ST&T Change: Nonspecific    RADIOLOGY I have independently visualized and interpreted patient's chest x-ray as well as noted the radiology interpretation:  X-ray: Multifocal patchy opacities  Official radiology report(s): DG Chest Port 1 View  Result Date: 11/02/2022 CLINICAL DATA:  Shortness of breath EXAM: PORTABLE CHEST 1 VIEW COMPARISON:  09/08/2022 FINDINGS: Multifocal patchy opacities, left lower lobe predominant, suspicious for pneumonia. No pleural effusion or pneumothorax. The heart is top-normal in size. IMPRESSION: Multifocal patchy opacities, left lower lobe predominant, suspicious for pneumonia. Electronically Signed   By: Charline Bills M.D.   On: 11/02/2022 01:27     PROCEDURES:  Critical Care performed: Yes, see critical care procedure note(s)  CRITICAL CARE Performed by: Irean Hong   Total critical care time: 45 minutes  Critical care time was exclusive of separately billable procedures and treating other patients.  Critical care was necessary to treat or prevent imminent or life-threatening deterioration.  Critical care was time spent personally by me on the following activities: development of treatment plan with patient and/or surrogate as well as nursing, discussions with consultants, evaluation of patient's response to treatment, examination of patient, obtaining history from patient or surrogate, ordering and performing treatments and interventions, ordering and review of laboratory studies, ordering and review of radiographic studies, pulse oximetry and re-evaluation of patient's condition.   Marland Kitchen1-3 Lead EKG Interpretation  Performed by: Irean Hong, MD Authorized by: Irean Hong, MD     Interpretation: abnormal     ECG rate:  98   ECG rate assessment: normal     Rhythm: atrial fibrillation     Ectopy: none     Conduction: normal   Comments:     Patient placed on cardiac  monitor to evaluate for arrhythmias    MEDICATIONS ORDERED IN ED: Medications  potassium chloride 10 mEq in 100 mL IVPB (0 mEq Intravenous Stopped 11/02/22  1017)  amiodarone (PACERONE) tablet 200 mg (has no administration in time range)  atorvastatin (LIPITOR) tablet 10 mg (has no administration in time range)  fludrocortisone (FLORINEF) tablet 200 mcg (has no administration in time range)  pantoprazole (PROTONIX) EC tablet 40 mg (has no administration in time range)  apixaban (ELIQUIS) tablet 5 mg (has no administration in time range)  gabapentin (NEURONTIN) capsule 300 mg (has no administration in time range)  fluticasone furoate-vilanterol (BREO ELLIPTA) 100-25 MCG/ACT 1 puff (has no administration in time range)  albuterol (PROVENTIL) (2.5 MG/3ML) 0.083% nebulizer solution 2.5 mg (has no administration in time range)  ipratropium-albuterol (DUONEB) 0.5-2.5 (3) MG/3ML nebulizer solution 3 mL (3 mLs Nebulization Given 11/02/22 0703)  acetaminophen (TYLENOL) tablet 650 mg (has no administration in time range)    Or  acetaminophen (TYLENOL) suppository 650 mg (has no administration in time range)  oxyCODONE (Oxy IR/ROXICODONE) immediate release tablet 5 mg (has no administration in time range)  morphine (PF) 2 MG/ML injection 2 mg (has no administration in time range)  ondansetron (ZOFRAN) tablet 4 mg (has no administration in time range)    Or  ondansetron (ZOFRAN) injection 4 mg (has no administration in time range)  methylPREDNISolone sodium succinate (SOLU-MEDROL) 125 mg/2 mL injection 125 mg (has no administration in time range)    Followed by  predniSONE (DELTASONE) tablet 40 mg (has no administration in time range)  midodrine (PROAMATINE) tablet 10 mg (has no administration in time range)  phenytoin (DILANTIN) ER capsule 100 mg (has no administration in time range)    And  phenytoin (DILANTIN) ER capsule 200 mg (has no administration in time range)    And  phenytoin (DILANTIN)  ER capsule 100 mg (has no administration in time range)  methylPREDNISolone sodium succinate (SOLU-MEDROL) 125 mg/2 mL injection 125 mg (125 mg Intravenous Given 11/02/22 0112)  magnesium sulfate IVPB 2 g 50 mL (0 g Intravenous Stopped 11/02/22 0227)  ipratropium-albuterol (DUONEB) 0.5-2.5 (3) MG/3ML nebulizer solution 3 mL (3 mLs Nebulization Given 11/02/22 0111)  cefTRIAXone (ROCEPHIN) 1 g in sodium chloride 0.9 % 100 mL IVPB (0 g Intravenous Stopped 11/02/22 0311)  azithromycin (ZITHROMAX) 500 mg in sodium chloride 0.9 % 250 mL IVPB (0 mg Intravenous Stopped 11/02/22 0346)     IMPRESSION / MDM / ASSESSMENT AND PLAN / ED COURSE  I reviewed the triage vital signs and the nursing notes.                             88 year old female presenting in acute respiratory distress and hypoxia. Differential includes, but is not limited to, viral syndrome, bronchitis including COPD exacerbation, pneumonia, reactive airway disease including asthma, CHF including exacerbation with or without pulmonary/interstitial edema, pneumothorax, ACS, thoracic trauma, and pulmonary embolism.  I have personally reviewed patient's records and note cardiology cardioversion op note from 10/31/2022.  Patient's presentation is most consistent with acute presentation with potential threat to life or bodily function.  The patient is on the cardiac monitor to evaluate for evidence of arrhythmia and/or significant heart rate changes.  Will place patient on BiPAP.  Obtain cardiac panel, chest x-ray.  Administer 125 mg IV Solu-Medrol, 2 g IV Magnesium and DuoNeb.  Anticipate hospitalization.  Clinical Course as of 11/02/22 5102  Sat Nov 02, 2022  0101 Patient appears better on BiPAP. [JS]  0111 Patient could not tolerate ABG; VBG obtained instead. [JS]  [id number] Updated patient and daughter on all test results:  Normal WBC 8.7, mild hypokalemia with potassium 3.2, negative respiratory panel/troponin/magnesium.  Lactic acid less  than 2.  IV Rocephin and azithromycin were given for multifocal pneumonia.  Will repeat potassium orally.  Will consult hospitalist services for evaluation and admission. [JS]    Clinical Course User Index [JS] Irean Hong, MD     FINAL CLINICAL IMPRESSION(S) / ED DIAGNOSES   Final diagnoses:  Hypoxia  Respiratory distress  Multifocal pneumonia     Rx / DC Orders   ED Discharge Orders     None        Note:  This document was prepared using Dragon voice recognition software and may include unintentional dictation errors.   Irean Hong, MD 11/02/22 [phone]

## 2022-11-02 NOTE — ED Triage Notes (Signed)
Sudden onset SOB 30 minutes ago. No CP/fevers. Cardioversion yesterday for Afib/RVR. Currently rate controlled afib. 90% on RA upon EMS arrival to home. EMS gave 2 duonebs enroute bringing patient up 99% on RA. Wheezing still present.

## 2022-11-03 DIAGNOSIS — Z7952 Long term (current) use of systemic steroids: Secondary | ICD-10-CM | POA: Diagnosis not present

## 2022-11-03 DIAGNOSIS — J440 Chronic obstructive pulmonary disease with acute lower respiratory infection: Secondary | ICD-10-CM | POA: Diagnosis present

## 2022-11-03 DIAGNOSIS — J189 Pneumonia, unspecified organism: Secondary | ICD-10-CM | POA: Diagnosis present

## 2022-11-03 DIAGNOSIS — I471 Supraventricular tachycardia, unspecified: Secondary | ICD-10-CM | POA: Diagnosis present

## 2022-11-03 DIAGNOSIS — I5033 Acute on chronic diastolic (congestive) heart failure: Secondary | ICD-10-CM | POA: Diagnosis present

## 2022-11-03 DIAGNOSIS — Z20822 Contact with and (suspected) exposure to covid-19: Secondary | ICD-10-CM | POA: Diagnosis present

## 2022-11-03 DIAGNOSIS — J9601 Acute respiratory failure with hypoxia: Secondary | ICD-10-CM | POA: Diagnosis present

## 2022-11-03 DIAGNOSIS — D509 Iron deficiency anemia, unspecified: Secondary | ICD-10-CM | POA: Diagnosis present

## 2022-11-03 DIAGNOSIS — R0603 Acute respiratory distress: Secondary | ICD-10-CM | POA: Diagnosis present

## 2022-11-03 DIAGNOSIS — I951 Orthostatic hypotension: Secondary | ICD-10-CM | POA: Diagnosis present

## 2022-11-03 DIAGNOSIS — E876 Hypokalemia: Secondary | ICD-10-CM | POA: Diagnosis present

## 2022-11-03 DIAGNOSIS — N1831 Chronic kidney disease, stage 3a: Secondary | ICD-10-CM | POA: Diagnosis present

## 2022-11-03 DIAGNOSIS — Z87891 Personal history of nicotine dependence: Secondary | ICD-10-CM | POA: Diagnosis not present

## 2022-11-03 DIAGNOSIS — F419 Anxiety disorder, unspecified: Secondary | ICD-10-CM | POA: Diagnosis present

## 2022-11-03 DIAGNOSIS — G40909 Epilepsy, unspecified, not intractable, without status epilepticus: Secondary | ICD-10-CM | POA: Diagnosis present

## 2022-11-03 DIAGNOSIS — Z79899 Other long term (current) drug therapy: Secondary | ICD-10-CM | POA: Diagnosis not present

## 2022-11-03 DIAGNOSIS — N390 Urinary tract infection, site not specified: Secondary | ICD-10-CM | POA: Diagnosis present

## 2022-11-03 DIAGNOSIS — Z7901 Long term (current) use of anticoagulants: Secondary | ICD-10-CM | POA: Diagnosis not present

## 2022-11-03 DIAGNOSIS — I130 Hypertensive heart and chronic kidney disease with heart failure and stage 1 through stage 4 chronic kidney disease, or unspecified chronic kidney disease: Secondary | ICD-10-CM | POA: Diagnosis present

## 2022-11-03 DIAGNOSIS — K760 Fatty (change of) liver, not elsewhere classified: Secondary | ICD-10-CM | POA: Diagnosis present

## 2022-11-03 DIAGNOSIS — I493 Ventricular premature depolarization: Secondary | ICD-10-CM | POA: Diagnosis present

## 2022-11-03 DIAGNOSIS — I4819 Other persistent atrial fibrillation: Secondary | ICD-10-CM | POA: Diagnosis present

## 2022-11-03 DIAGNOSIS — J441 Chronic obstructive pulmonary disease with (acute) exacerbation: Secondary | ICD-10-CM | POA: Diagnosis present

## 2022-11-03 DIAGNOSIS — E782 Mixed hyperlipidemia: Secondary | ICD-10-CM | POA: Diagnosis present

## 2022-11-03 LAB — CBC
HCT: 29.8 % — ABNORMAL LOW (ref 36.0–46.0)
Hemoglobin: 9.4 g/dL — ABNORMAL LOW (ref 12.0–15.0)
MCH: 30.3 pg (ref 26.0–34.0)
MCHC: 31.5 g/dL (ref 30.0–36.0)
MCV: 96.1 fL (ref 80.0–100.0)
Platelets: 194 10*3/uL (ref 150–400)
RBC: 3.1 MIL/uL — ABNORMAL LOW (ref 3.87–5.11)
RDW: 13.7 % (ref 11.5–15.5)
WBC: 9.1 10*3/uL (ref 4.0–10.5)
nRBC: 0 % (ref 0.0–0.2)

## 2022-11-03 LAB — BASIC METABOLIC PANEL
Anion gap: 8 (ref 5–15)
BUN: 20 mg/dL (ref 8–23)
CO2: 28 mmol/L (ref 22–32)
Calcium: 8.5 mg/dL — ABNORMAL LOW (ref 8.9–10.3)
Chloride: 108 mmol/L (ref 98–111)
Creatinine, Ser: 1.04 mg/dL — ABNORMAL HIGH (ref 0.44–1.00)
GFR, Estimated: 51 mL/min — ABNORMAL LOW (ref >60)
Glucose, Bld: 102 mg/dL — ABNORMAL HIGH (ref 70–99)
Potassium: 3.6 mmol/L (ref 3.5–5.1)
Sodium: 144 mmol/L (ref 135–145)

## 2022-11-03 LAB — MAGNESIUM: Magnesium: 2.1 mg/dL (ref 1.7–2.4)

## 2022-11-03 LAB — PHOSPHORUS: Phosphorus: 3.6 mg/dL (ref 2.5–4.6)

## 2022-11-03 MED ORDER — IPRATROPIUM-ALBUTEROL 0.5-2.5 (3) MG/3ML IN SOLN
3.0000 mL | Freq: Three times a day (TID) | RESPIRATORY_TRACT | Status: DC
  Administered 2022-11-03 – 2022-11-04 (×4): 3 mL via RESPIRATORY_TRACT
  Filled 2022-11-03 (×4): qty 3

## 2022-11-03 MED ORDER — PREDNISONE 20 MG PO TABS
40.0000 mg | ORAL_TABLET | Freq: Every day | ORAL | Status: DC
  Administered 2022-11-03 – 2022-11-04 (×2): 40 mg via ORAL
  Filled 2022-11-03 (×2): qty 2

## 2022-11-03 MED ORDER — ALPRAZOLAM 0.25 MG PO TABS
0.2500 mg | ORAL_TABLET | Freq: Once | ORAL | Status: AC
  Administered 2022-11-03: 0.25 mg via ORAL
  Filled 2022-11-03: qty 1

## 2022-11-03 MED ORDER — AZITHROMYCIN 250 MG PO TABS
500.0000 mg | ORAL_TABLET | Freq: Once | ORAL | Status: AC
  Administered 2022-11-04: 500 mg via ORAL
  Filled 2022-11-03: qty 2

## 2022-11-03 MED ORDER — FUROSEMIDE 10 MG/ML IJ SOLN
20.0000 mg | Freq: Once | INTRAMUSCULAR | Status: AC
  Administered 2022-11-03: 20 mg via INTRAVENOUS
  Filled 2022-11-03: qty 4

## 2022-11-03 MED ORDER — POTASSIUM CHLORIDE CRYS ER 20 MEQ PO TBCR
40.0000 meq | EXTENDED_RELEASE_TABLET | Freq: Once | ORAL | Status: AC
  Administered 2022-11-03: 40 meq via ORAL
  Filled 2022-11-03: qty 2

## 2022-11-03 NOTE — Progress Notes (Signed)
Progress Note   Patient: Tricia Edwards FAO:[id number] DOB: [birth year] DOA: 11/02/2022     0 DOS: the patient was seen and examined on 11/03/2022   Brief hospital course: 88 year old female with a PMH of Asthma, Chronic Diastolic Heart Failure with EF 60-65%, Persistent Atrial Fibrillation s/p 12/14 DCCV with recurrence presented to the ED on 12/17 with palpitations and shortness of breath found to be in Afib.  12/17: Heart rate has been stable x 24 hours.  Respiratory symptoms have improved but not resolved.  Patient remains anxious.  Assessment and Plan: Chronic Persistent Atrial Fibrillation s/p 12/14 DCCV with recurrence: Shortly after cardioversion, there is a transient shortening of atrial refractoriness and re-initiation of Afib can occur due to premature beats and atrial remodeling.  Patient's age and comorbidities put her at high risk of hyperacute recurrence.  This should be paroxysmal not persistent. - Heart rate has been stable on telemetry x 24 hours. - Continue Amiodarone for rhythm control and Eliquis for anticoagulation. - Follow up with Cardiology outpatient.  Hypokalemia, replaced: - Monitor K and Mg.  Acute Hypoxic Respiratory Failure, resolved: Oxygen saturation is normal on room air.  I believe anxiety is a large component of patient's symptoms. - Give Xanax 0.25 mg TID PRN.  Acute COPD Exacerbation: Given continued shortness of breath, I will go ahead complete course of steroids. - This is Day 2 out of 5 of steroids.  Acute on Chronic Diastolic Heart Failure / Small Bilateral Pleural Effusions: - Give IV Lasix 20 mg x once (second dose).  Right sided Pneumonia: Patient has cough and dyspnea.  Chest CT confirms infiltrates on the right side.  I will treat this as pneumonia. - This is Day 3 out of 5 of antibiotics.  Hypotension: - Midodrine 10 mg TID and Fludrocortisone.  Seizure Disorder: - Dilantin.  Hyperlipidemia: - Statin.  Physical Deconditioning:  Patient is able to walk to and from the bathroom with assistance. - PT recommends Home Health PT.      Subjective: Tricia Edwards is doing well today.  She didn't sleep well last night and is a little anxious this morning.  Her symptoms improved after receiving Xanax.    Heart rate is stable.  She denies any chest pain or shortness of breath.    Tricia Edwards tells me she is on her way to [facility] and would like to get there as soon as possible.  Physical Exam: Vitals:   11/03/22 0753 11/03/22 0808 11/03/22 1142 11/03/22 1330  BP: (!) 162/81  (!) 115/59   Pulse: 78  90   Resp: 18     Temp: 97.8 F (36.6 C)     TempSrc:      SpO2: (!) 89% 91%  90%  Weight:      Height:       Examination: General exam: chronically ill appearing, fatigued, a little anxious HEENT: NCAT, PERRL Respiratory system: mild bibasilar crackles Cardiovascular system: Did not appreciate a murmur, regular, No JVD. Gastrointestinal system: No flank pain, Abdomen soft, NT,ND, BS+. Nervous System: No focal deficits. Extremities: No edema, distal peripheral pulses palpable.  Skin: scattered bruises, no rashes. MSK: mild deconditioning  Data Reviewed:  There are no new results to review at this time.  CT CHEST WO CONTRAST CLINICAL DATA:  88 year old female with shortness of breath. History of atrial fibrillation.  EXAM: CT CHEST WITHOUT CONTRAST  TECHNIQUE: Multidetector CT imaging of the chest was performed following the standard protocol without IV contrast.  RADIATION  DOSE REDUCTION: This exam was performed according to the departmental dose-optimization program which includes automated exposure control, adjustment of the mA and/or kV according to patient size and/or use of iterative reconstruction technique.  COMPARISON:  11/02/2022 prior radiographs  FINDINGS: Cardiovascular: Cardiomegaly noted. Coronary artery and aortic atherosclerotic calcifications noted. There is no evidence of thoracic  aortic aneurysm or pericardial effusion.  Mediastinum/Nodes: No enlarged mediastinal or axillary lymph nodes. The visualized thyroid gland, trachea, and esophagus demonstrate no significant findings.  Lungs/Pleura: Small bilateral pleural effusions and mild to moderate bibasilar opacities/probable atelectasis noted. There is no evidence of discrete mass, suspicious nodule or pneumothorax.  Upper Abdomen: No acute abnormality  Musculoskeletal: No acute or suspicious bony abnormalities are noted.  IMPRESSION: 1. Small bilateral pleural effusions and mild to moderate bibasilar opacities/probable atelectasis. 2. Cardiomegaly and coronary artery disease. 3.  Aortic Atherosclerosis (ICD10-I70.0).  Electronically Signed   By: Harmon Pier M.D.   On: 11/02/2022 14:12 DG Chest Port 1 View CLINICAL DATA:  Dyspnea  EXAM: PORTABLE CHEST 1 VIEW  COMPARISON:  Previous studies including the examination done earlier today  FINDINGS: Transverse diameter of heart is increased. Increased interstitial markings are seen in parahilar regions and lower lung fields. There is interval increase in infiltrates in left lower lung field. Lateral CP angles are clear. There is no pneumothorax.  IMPRESSION: Increased interstitial and alveolar densities in both lungs suggest pulmonary edema and possibly multifocal pneumonia.  Electronically Signed   By: Ernie Avena M.D.   On: 11/02/2022 08:14 DG Chest Port 1 View CLINICAL DATA:  Shortness of breath  EXAM: PORTABLE CHEST 1 VIEW  COMPARISON:  09/08/2022  FINDINGS: Multifocal patchy opacities, left lower lobe predominant, suspicious for pneumonia. No pleural effusion or pneumothorax.  The heart is top-normal in size.  IMPRESSION: Multifocal patchy opacities, left lower lobe predominant, suspicious for pneumonia.  Electronically Signed   By: Charline Bills M.D.   On: 11/02/2022 01:27    Family Communication: I spoke with  patient only.  Disposition: Status is: Inpatient The patient remains Inpatient appropriate and will d/c after 2 midnights.  Planned Discharge Destination: Home with Home Health PT    Time spent: >30 minutes  Author: Baldwin [place], MD 11/03/2022 2:55 PM  For on call review [email or web address].

## 2022-11-03 NOTE — TOC Initial Note (Signed)
Transition of Care [facility]) - Initial/Assessment Note    Patient Details  Name: Tricia Edwards MRN: [id number] Date of Birth: [birth year]  [facility]) CM/SW Contact:    Rebekah [place]field, LCSW Phone Number: 11/03/2022, 3:46 PM  Clinical Narrative:                 CSW met with patient noting adult son at bedside. CSW introduced role and discussed PT evaluation recommendation for [facility] PT. Patient declined services, stating that she completes daily exercises from previous [facility] services earlier in the year. Patient will be visiting with adult daughters in [place] shortly after discharge for approximately 3 months. Family reports that patient will have access to DME (rollater walker, cane, wheelchair) in addition to grab bars and shower seats in restrooms. Both daughters reside in one level homes. CSW encouraged family to contact PCP, if need for [facility] services arise, while out of state   No further concerns. CSW encouraged patient to contact CSW as needed. CSW will continue to follow patient for support    Barriers to Discharge: Continued Medical Work up   Patient Goals and CMS Choice     Choice offered to / list presented to : Patient, Adult Children  Expected Discharge Plan and Services                                                Prior Living Arrangements/Services     Patient language and need for interpreter reviewed:: Yes Do you feel safe going back to the place where you live?: Yes      Need for Family Participation in Patient Care: Yes (Comment) Care giver support system in place?: Yes (comment)   Criminal Activity/Legal Involvement Pertinent to Current Situation/Hospitalization: No - Comment as needed  Activities of Daily Living Home Assistive Devices/Equipment: Walker (specify type), Shower chair without back, Grab bars in shower (rollator) ADL Screening (condition at time of admission) Patient's cognitive ability adequate to safely complete daily  activities?: Yes Is the patient deaf or have difficulty hearing?: Yes Does the patient have difficulty seeing, even when wearing glasses/contacts?: No Does the patient have difficulty concentrating, remembering, or making decisions?: No Patient able to express need for assistance with ADLs?: Yes Does the patient have difficulty dressing or bathing?: No Independently performs ADLs?: Yes (appropriate for developmental age) Does the patient have difficulty walking or climbing stairs?: No Weakness of Legs: None Weakness of Arms/Hands: None  Permission Sought/Granted                  Emotional Assessment Appearance:: Appears stated age Attitude/Demeanor/Rapport: Engaged Affect (typically observed): Appropriate, Pleasant Orientation: : Oriented to Self, Oriented to  Time, Oriented to Place, Oriented to Situation Alcohol / Substance Use: Not Applicable Psych Involvement: No (comment)  Admission diagnosis:  Respiratory distress [R06.03] Hypoxia [R09.02] Acute respiratory failure with hypoxia ([place]) [J96.01] Multifocal pneumonia [J18.9] Pneumonia [J18.9] Patient Active Problem List   Diagnosis Date Noted   Pneumonia 11/03/2022   Acute respiratory failure with hypoxia ([place]) 11/02/2022   Persistent atrial fibrillation ([place]) 10/31/2022   SOB (shortness of breath) 10/31/2022   Acute on chronic diastolic CHF (congestive heart failure) ([place]) 05/23/2022   Atrial fibrillation with RVR ([place]) 05/22/2022   Chronic kidney disease, stage 3a ([place]) 05/22/2022   Iron deficiency anemia 05/22/2022   Syncope 05/22/2022   Elevated  troponin    UTI (urinary tract infection) 01/08/2022   Hepatic steatosis 12/04/2021   Chronic idiopathic constipation 08/21/2021   Chronic diastolic congestive heart failure ([place]) 05/25/2021   Paroxysmal atrial fibrillation ([facility]) 05/25/2021   Hypokalemia 01/18/2021   Chronic orthostatic hypotension 01/18/2021   History of trigeminal neuralgia 01/18/2021   Dizziness  08/08/2020   Osteoporosis 12/30/2018   Vitamin D deficiency 12/22/2018   Syncope and collapse 03/15/2018   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/09/2018   Unspecified abdominal hernia without obstruction or gangrene 05/05/2017   Gastro-esophageal reflux disease with esophagitis 03/10/2017   Anemia, unspecified 02/06/2017   Macular degeneration, dry 01/01/2017   Pseudophakia of right eye 01/01/2017   Vitamin B12 deficiency 01/01/2017   Hyponatremia 08/22/2016   Trigeminal neuralgia 03/24/2016   Radiculopathy, cervical region 03/24/2016   Atherosclerotic heart disease of native coronary artery without angina pectoris 11/08/2015   Stage 3 chronic kidney disease ([place]) 10/29/2013   Spondylosis without myelopathy or radiculopathy, site unspecified 06/28/2013   Nuclear senile cataract 01/07/2013   Obesity 08/09/2010   Benign paroxysmal positional vertigo 10/18/2008   Other dorsalgia 12/04/2005   Hyperlipidemia 09/23/2004   PCP:  [facility] [place]:   [place], [place] - [address] [place] [address] Phone: [phone] Fax: [phone]     Social Determinants of Health (SDOH) Interventions    Readmission Risk Interventions     No data to display

## 2022-11-03 NOTE — Evaluation (Signed)
Physical Therapy Evaluation Patient Details Name: Tricia Edwards MRN: [id number] DOB: [birth year] Today's Date: 11/03/2022  History of Present Illness  Taken from [place], DO  11/02/2022 5:28 AM                                                                                                                                       Tricia Edwards is a 88 y.o. female with medical history significant of hypertension, paroxysmal atrial fibrillation, GERD, hyperlipidemia, and more presents to ED with a chief complaint of dyspnea.  Unfortunately at the my exam patient is not able to provide any history given that she is exhausted, but also that she is on BiPAP and communication is severely limited.  Patient recently had attempted cardioversion secondary to atrial fibrillation on 10/31/2022.  She is back in atrial fibrillation at this time.  She reports she has had progressive shortness of breath since the procedure.  Imaging shows possible pneumonia, procalcitonin is less than 0.10.  Patient had reported wheezing at home.  EMS gave 2 DuoNeb's on the way to the hospital with the patient.  She was still wheezing on arrival.  She was satting at 90% on room air.  Patient was not able to communicate any further complaints.  Clinical Impression  Patient presents as alert and cooperative- agreeing to PT evaluation with daughter also present during session. She presents today with bilateral LE weakness with some difficulty with transfers and later walking requiring physical hand held assist. She was unsteady with mobility and required +2 hand held assist with walking. She presented with slight decrease in O2 sat with ambulation- from 95% down to 91% but improved with pursed lip breathing. She will benefit from skilled PT services to address her functional weakness and immobility to maximize her safety, function and independent mobility.        Recommendations for follow up therapy are one component of a  multi-disciplinary discharge planning process, led by the attending physician.  Recommendations may be updated based on patient status, additional functional criteria and insurance authorization.  Follow Up Recommendations Home health PT      Assistance Recommended at Discharge Frequent or constant Supervision/Assistance  Patient can return home with the following  A little help with walking and/or transfers;A little help with bathing/dressing/bathroom;Assistance with cooking/housework;Direct supervision/assist for medications management;Assist for transportation;Help with stairs or ramp for entrance    Equipment Recommendations None recommended by PT  Recommendations for Other Services       Functional Status Assessment Patient has had a recent decline in their functional status and demonstrates the ability to make significant improvements in function in a reasonable and predictable amount of time.     Precautions / Restrictions        Mobility  Bed Mobility Overal bed mobility: Needs Assistance Bed Mobility: Sit to Supine       Sit to supine: Min guard  Patient Response: Cooperative  Transfers Overall transfer level: Needs assistance Equipment used: 1 person hand held assist Transfers: Sit to/from Stand Sit to Stand: Min guard                Ambulation/Gait Ambulation/Gait assistance: Min guard Gait Distance (Feet): 100 Feet Assistive device: 2 person hand held assist Gait Pattern/deviations: Step-through pattern, Decreased step length - right, Decreased step length - left   Gait velocity interpretation: <1.8 ft/sec, indicate of risk for recurrent falls   General Gait Details: Unsteady requring physical assist today- decreased step length  Stairs            Wheelchair Mobility    Modified Rankin (Stroke Patients Only)       Balance Overall balance assessment: Mild deficits observed, not formally tested                                            Pertinent Vitals/Pain Pain Assessment Pain Assessment: No/denies pain    Home Living Family/patient expects to be discharged to:: Private residence Living Arrangements: Children (Lives with adult dtr) Available Help at Discharge: Family;Available 24 hours/day Type of Home: House Home Access: Level entry       Home Layout: One level Home Equipment: Agricultural consultant (2 wheels);Cane - single point;Shower seat;Wheelchair - manual      Prior Function Prior Level of Function : Independent/Modified Independent             Mobility Comments: Patient and daughter report patient is independent ADLs Comments: Patient and dtr report she is independent     Hand Dominance   Dominant Hand: Left    Extremity/Trunk Assessment   Upper Extremity Assessment Upper Extremity Assessment: Generalized weakness    Lower Extremity Assessment Lower Extremity Assessment: Generalized weakness    Cervical / Trunk Assessment Cervical / Trunk Assessment: Kyphotic  Communication   Communication: No difficulties  Cognition Arousal/Alertness: Awake/alert Behavior During Therapy: WFL for tasks assessed/performed Overall Cognitive Status: Within Functional Limits for tasks assessed                                          General Comments General comments (skin integrity, edema, etc.): unstable to dynamic walking- requiring +2 assist    Exercises     Assessment/Plan    PT Assessment Patient needs continued PT services  PT Problem List Decreased strength;Decreased mobility;Decreased coordination;Decreased activity tolerance;Cardiopulmonary status limiting activity;Decreased balance       PT Treatment Interventions Gait training;DME instruction;Functional mobility training;Therapeutic activities;Therapeutic exercise;Neuromuscular re-education;Balance training;Stair training;Patient/family education    PT Goals (Current goals can be found in the Care  Plan section)  Acute Rehab PT Goals Patient Stated Goal: to get well enough to go home PT Goal Formulation: With patient/family Time For Goal Achievement: 11/16/22 Potential to Achieve Goals: Good    Frequency Min 2X/week     Co-evaluation               AM-PAC PT "6 Clicks" Mobility  Outcome Measure Help needed turning from your back to your side while in a flat bed without using bedrails?: A Little Help needed moving from lying on your back to sitting on the side of a flat bed without using bedrails?: A Little Help needed moving to and from  a bed to a chair (including a wheelchair)?: A Little Help needed standing up from a chair using your arms (e.g., wheelchair or bedside chair)?: A Little Help needed to walk in hospital room?: A Little Help needed climbing 3-5 steps with a railing? : A Lot 6 Click Score: 17    End of Session Equipment Utilized During Treatment: Gait belt Activity Tolerance: Patient tolerated treatment well Patient left: in chair;with call bell/phone within reach;with chair alarm set;with family/visitor present Nurse Communication: Mobility status PT Visit Diagnosis: Unsteadiness on feet (R26.81);Other abnormalities of gait and mobility (R26.89);Muscle weakness (generalized) (M62.81);Difficulty in walking, not elsewhere classified (R26.2)    Time: 9024-0973 PT Time Calculation (min) (ACUTE ONLY): 33 min   Charges:   PT Evaluation $PT Eval Moderate Complexity: 1 Mod PT Treatments $Gait Training: 8-22 mins        Louis Meckel, PT  Merry Lofty Daneshia Tavano 11/03/2022, 1:05 PM

## 2022-11-03 NOTE — Progress Notes (Signed)
Mobility Specialist - Progress Note    11/03/22 1402  Mobility  Activity Ambulated with assistance in hallway;Stood at bedside  Level of Assistance Contact guard assist, steadying assist  Assistive Device Front wheel walker  Distance Ambulated (ft) 160 ft  Activity Response Tolerated well  Mobility Referral Yes  $Mobility charge 1 Mobility   Pt resting in recliner on RA upon entry. Pt STS and ambulates CGA with RW in hallway around NS for 1 lap. Pt stumbled initially and leans to the right side during ambulation. Pt given VC to correct gait as well as gait belt to help balance. Pt return to recliner and left with needs in reach; recliner alarm activated.   Johnathan Hausen Mobility Specialist 11/03/22, 2:12 PM

## 2022-11-04 DIAGNOSIS — I5033 Acute on chronic diastolic (congestive) heart failure: Secondary | ICD-10-CM

## 2022-11-04 DIAGNOSIS — J9601 Acute respiratory failure with hypoxia: Secondary | ICD-10-CM | POA: Diagnosis not present

## 2022-11-04 DIAGNOSIS — J441 Chronic obstructive pulmonary disease with (acute) exacerbation: Secondary | ICD-10-CM

## 2022-11-04 DIAGNOSIS — I4819 Other persistent atrial fibrillation: Secondary | ICD-10-CM | POA: Diagnosis not present

## 2022-11-04 DIAGNOSIS — J189 Pneumonia, unspecified organism: Secondary | ICD-10-CM

## 2022-11-04 DIAGNOSIS — E876 Hypokalemia: Secondary | ICD-10-CM | POA: Diagnosis not present

## 2022-11-04 LAB — BASIC METABOLIC PANEL
Anion gap: 7 (ref 5–15)
BUN: 23 mg/dL (ref 8–23)
CO2: 29 mmol/L (ref 22–32)
Calcium: 8.6 mg/dL — ABNORMAL LOW (ref 8.9–10.3)
Chloride: 105 mmol/L (ref 98–111)
Creatinine, Ser: 1.11 mg/dL — ABNORMAL HIGH (ref 0.44–1.00)
GFR, Estimated: 47 mL/min — ABNORMAL LOW (ref >60)
Glucose, Bld: 102 mg/dL — ABNORMAL HIGH (ref 70–99)
Potassium: 4 mmol/L (ref 3.5–5.1)
Sodium: 141 mmol/L (ref 135–145)

## 2022-11-04 LAB — CBC
HCT: 29.9 % — ABNORMAL LOW (ref 36.0–46.0)
Hemoglobin: 9.5 g/dL — ABNORMAL LOW (ref 12.0–15.0)
MCH: 30.7 pg (ref 26.0–34.0)
MCHC: 31.8 g/dL (ref 30.0–36.0)
MCV: 96.8 fL (ref 80.0–100.0)
Platelets: 189 10*3/uL (ref 150–400)
RBC: 3.09 MIL/uL — ABNORMAL LOW (ref 3.87–5.11)
RDW: 13.8 % (ref 11.5–15.5)
WBC: 6.7 10*3/uL (ref 4.0–10.5)
nRBC: 0 % (ref 0.0–0.2)

## 2022-11-04 LAB — MAGNESIUM: Magnesium: 2 mg/dL (ref 1.7–2.4)

## 2022-11-04 MED ORDER — FUROSEMIDE 20 MG PO TABS: 20.0000 mg | ORAL_TABLET | Freq: Every day | ORAL | 0 refills | Status: DC | PRN

## 2022-11-04 MED ORDER — LEVALBUTEROL HCL 0.63 MG/3ML IN NEBU: 0.6300 mg | INHALATION_SOLUTION | Freq: Four times a day (QID) | RESPIRATORY_TRACT | 2 refills | Status: DC | PRN

## 2022-11-04 MED ORDER — FLUTICASONE-SALMETEROL 250-50 MCG/ACT IN AEPB: 1.0000 | INHALATION_SPRAY | Freq: Two times a day (BID) | RESPIRATORY_TRACT | 1 refills | Status: DC

## 2022-11-04 MED ORDER — PREDNISONE 20 MG PO TABS: 40.0000 mg | ORAL_TABLET | Freq: Every day | ORAL | 0 refills | Status: AC

## 2022-11-04 MED ORDER — LEVALBUTEROL TARTRATE 45 MCG/ACT IN AERO: 1.0000 | INHALATION_SPRAY | Freq: Four times a day (QID) | RESPIRATORY_TRACT | 2 refills | Status: DC | PRN

## 2022-11-04 MED ORDER — AMOXICILLIN-POT CLAVULANATE 875-125 MG PO TABS: 1.0000 | ORAL_TABLET | Freq: Two times a day (BID) | ORAL | 0 refills | Status: AC

## 2022-11-04 NOTE — Progress Notes (Signed)
Sats 88-91% on room air while ambulating. After patient returned to the room and sat and rested for 1 minute sats increased to 96%. MD informed

## 2022-11-04 NOTE — Discharge Summary (Signed)
Physician Discharge Summary  Tricia Edwards OXB:[id number] DOB: 02/14/1930 DOA: 11/02/2022  PCP: [facility]  Admit date: 11/02/2022 Discharge date: 11/04/2022 Admitted From: Home Disposition: Home Recommendations for Outpatient Follow-up:  Follow up with PCP in 1 week Check BMP and CBC at follow-up. Please follow up on the following pending results: None  Home Health: HH PT Equipment/Devices: Not indicated  Discharge Condition: Stable CODE STATUS: Full code  Follow-up Information     [facility] Schedule an appointment as soon as possible for a visit in 1 week(s).   Contact information: [address] [place] [address] [phone]                 Hospital course 88 year old F with PMH of asthma, diastolic CHF, persistent A-fib and recent DCCV with recurrence presented to ED with palpitation and shortness of breath, wheezing, and admitted for  acute respiratory failure with hypoxia and possible COPD/asthma exacerbation.  VBG without significant finding.  Pro-Cal negative.  Troponin basically negative.  COVID-19, influenza and RSV PCR nonreactive.  CXR with multifocal patchy opacities, left lower lobe predominant, suspicious for pneumonia. Blood cultures obtained.  Initially started on BiPAP.  She was also started on CAP coverage, IV Lasix, steroid and breathing treatments.  On the day of discharge, blood cultures NGTD.  Patient's respiratory symptoms improved.  She was liberated off oxygen.  She is discharged on oral antibiotics, prednisone, Advair and Xopenex.  Encouraged to follow-up with PCP and cardiology.  Home health PT ordered as recommended by therapy.  See individual problem list below for more.   Problems addressed during this hospitalization Principal Problem:   Acute respiratory failure with hypoxia (HCC) Active Problems:   Hyperlipidemia   Hypokalemia   Persistent atrial fibrillation (HCC)   Pneumonia   Acute  hypoxic respiratory failure due to multifocal pneumonia: resolved. -Discharged on p.o. Augmentin, Zithromax and prednisone to complete treatment course. -Started Advair and as needed Xopenex  Acute COPD Exacerbation: In the setting of the above. -As above.  Acute on Chronic Diastolic Heart Failure / Small Bilateral Pleural Effusions: Briefly diuresed with IV Lasix.  Appears euvolemic. -P.o. Lasix 20 mg as needed  Chronic Persistent Atrial Fibrillation s/p 12/14 DCCV with recurrence: Rate controlled.   -Continue home amiodarone and Eliquis -Outpatient follow-up with cardiology   Hypokalemia: Resolved.   Chronic orthostatic hypotension: Stable. -Continue home compression socks, midodrine and Florinef   Seizure Disorder: -Continue home Dilantin.   Hyperlipidemia: -Continue home statin.  Normocytic anemia: Stable.   Physical Deconditioning: HH PT/OT           Vital signs Vitals:   11/03/22 1928 11/03/22 2007 11/04/22 0414 11/04/22 0754  BP:  (!) 119/54 (!) 158/80 (!) 147/77  Pulse:  80 83 87  Temp:    98.4 F (36.9 C)  Resp:  17 17 20   Height:      Weight:      SpO2: 91% 93% 94% 95%  TempSrc:    Oral  BMI (Calculated):         Discharge exam  GENERAL: No apparent distress.  Nontoxic. HEENT: MMM.  Vision and hearing grossly intact.  NECK: Supple.  No apparent JVD.  RESP:  No IWOB.  Fair aeration bilaterally. CVS:  RRR. Heart sounds normal.  ABD/GI/GU: BS+. Abd soft, NTND.  MSK/EXT:  Moves extremities. No apparent deformity. No edema.  SKIN: no apparent skin lesion or wound NEURO: Awake and alert. Oriented appropriately.  No apparent focal  neuro deficit. PSYCH: Calm. Normal affect.   Discharge Instructions Discharge Instructions     Call MD for:  difficulty breathing, headache or visual disturbances   Complete by: As directed    Call MD for:  extreme fatigue   Complete by: As directed    Call MD for:  persistant dizziness or light-headedness   Complete  by: As directed    Diet - low sodium heart healthy   Complete by: As directed    Discharge instructions   Complete by: As directed    It has been a pleasure taking care of you!  You were hospitalized with atrial fibrillation, pneumonia and possible COPD for which you have been treated.  Your symptoms improved to the point we think it is safe to let you go home and follow-up with your doctors.  We are discharging you on antibiotics, steroid and breathing treatment.  You may use Lasix (fluid medication) as needed for swelling or shortness of breath.  Follow-up with your primary care doctor and cardiologist in 1 to 2 weeks or sooner if needed.   Take care,   For home use only DME Nebulizer machine   Complete by: As directed    Patient needs a nebulizer to treat with the following condition: COPD (chronic obstructive pulmonary disease) (HCC)   Length of Need: 6 Months   Increase activity slowly   Complete by: As directed       Allergies as of 11/04/2022       Reactions   Pneumovax [pneumococcal Polysaccharide Vaccine]    Other reaction(s): Severe arm swelling   Pneumococcal Vaccine    Other reaction(s): Unknown        Medication List     TAKE these medications    amiodarone 200 MG tablet Commonly known as: PACERONE Take 1 tablet (200 mg total) by mouth 2 (two) times daily.   amoxicillin-clavulanate 875-125 MG tablet Commonly known as: AUGMENTIN Take 1 tablet by mouth 2 (two) times daily for 2 days.   atorvastatin 10 MG tablet Commonly known as: LIPITOR Take 10 mg by mouth daily.   CALCIUM 600/VITAMIN D PO Take 1 tablet by mouth in the morning and at bedtime.   Eliquis 5 MG Tabs tablet Generic drug: apixaban Take 5 mg by mouth 2 (two) times daily.   ferrous sulfate 324 MG Tbec Take 324 mg by mouth 3 (three) times a week. Take 1 tablet every morning, and 1 tablet in the evening every Wednesday and Saturday   fludrocortisone 0.1 MG tablet Commonly known as:  FLORINEF Take 2 tablets by mouth twice daily   fluticasone-salmeterol 250-50 MCG/ACT Aepb Commonly known as: Advair Diskus Inhale 1 puff into the lungs in the morning and at bedtime.   furosemide 20 MG tablet Commonly known as: Lasix Take 1 tablet (20 mg total) by mouth daily as needed for fluid or edema.   gabapentin 300 MG capsule Commonly known as: NEURONTIN Take 300 mg by mouth 2 (two) times daily.   levalbuterol 45 MCG/ACT inhaler Commonly known as: XOPENEX HFA Inhale 1 puff into the lungs every 6 (six) hours as needed for wheezing or shortness of breath.   midodrine 10 MG tablet Commonly known as: PROAMATINE Take 10 mg by mouth 3 (three) times daily.   omeprazole 20 MG capsule Commonly known as: PRILOSEC Take 20 mg by mouth daily.   phenytoin 100 MG ER capsule Commonly known as: DILANTIN Take 100-200 mg by mouth 2 (two) times daily. Take 200 mg  every other morning alternating with 100 mg, and 100 mg capsule daily in the evening   predniSONE 20 MG tablet Commonly known as: DELTASONE Take 2 tablets (40 mg total) by mouth daily with breakfast for 3 days. Start taking on: November 05, 2022   senna 8.6 MG Tabs tablet Commonly known as: SENOKOT Take 1 tablet by mouth daily as needed for mild constipation.               Durable Medical Equipment  (From admission, onward)           Start     Ordered   11/04/22 1139  For home use only DME Nebulizer machine  Once       Question Answer Comment  Patient needs a nebulizer to treat with the following condition COPD (chronic obstructive pulmonary disease) (HCC)   Length of Need Lifetime      11/04/22 1138   11/04/22 0000  For home use only DME Nebulizer machine       Question Answer Comment  Patient needs a nebulizer to treat with the following condition COPD (chronic obstructive pulmonary disease) (HCC)   Length of Need 6 Months      11/04/22 0911             Consultations: None  Procedures/Studies:   CT CHEST WO CONTRAST  Result Date: 11/02/2022 CLINICAL DATA:  88 year old female with shortness of breath. History of atrial fibrillation. EXAM: CT CHEST WITHOUT CONTRAST TECHNIQUE: Multidetector CT imaging of the chest was performed following the standard protocol without IV contrast. RADIATION DOSE REDUCTION: This exam was performed according to the departmental dose-optimization program which includes automated exposure control, adjustment of the mA and/or kV according to patient size and/or use of iterative reconstruction technique. COMPARISON:  11/02/2022 prior radiographs FINDINGS: Cardiovascular: Cardiomegaly noted. Coronary artery and aortic atherosclerotic calcifications noted. There is no evidence of thoracic aortic aneurysm or pericardial effusion. Mediastinum/Nodes: No enlarged mediastinal or axillary lymph nodes. The visualized thyroid gland, trachea, and esophagus demonstrate no significant findings. Lungs/Pleura: Small bilateral pleural effusions and mild to moderate bibasilar opacities/probable atelectasis noted. There is no evidence of discrete mass, suspicious nodule or pneumothorax. Upper Abdomen: No acute abnormality Musculoskeletal: No acute or suspicious bony abnormalities are noted. IMPRESSION: 1. Small bilateral pleural effusions and mild to moderate bibasilar opacities/probable atelectasis. 2. Cardiomegaly and coronary artery disease. 3.  Aortic Atherosclerosis (ICD10-I70.0). Electronically Signed   By: Harmon Pier M.D.   On: 11/02/2022 14:12   DG Chest Port 1 View  Result Date: 11/02/2022 CLINICAL DATA:  Dyspnea EXAM: PORTABLE CHEST 1 VIEW COMPARISON:  Previous studies including the examination done earlier today FINDINGS: Transverse diameter of heart is increased. Increased interstitial markings are seen in parahilar regions and lower lung fields. There is interval increase in infiltrates in left lower lung field. Lateral CP  angles are clear. There is no pneumothorax. IMPRESSION: Increased interstitial and alveolar densities in both lungs suggest pulmonary edema and possibly multifocal pneumonia. Electronically Signed   By: Ernie Avena M.D.   On: 11/02/2022 08:14   DG Chest Port 1 View  Result Date: 11/02/2022 CLINICAL DATA:  Shortness of breath EXAM: PORTABLE CHEST 1 VIEW COMPARISON:  09/08/2022 FINDINGS: Multifocal patchy opacities, left lower lobe predominant, suspicious for pneumonia. No pleural effusion or pneumothorax. The heart is top-normal in size. IMPRESSION: Multifocal patchy opacities, left lower lobe predominant, suspicious for pneumonia. Electronically Signed   By: Charline Bills M.D.   On: 11/02/2022 01:27  The results of significant diagnostics from this hospitalization (including imaging, microbiology, ancillary and laboratory) are listed below for reference.     Microbiology: Recent Results (from the past 240 hour(s))  Resp panel by RT-PCR (RSV, Flu A&B, Covid) Anterior Nasal Swab     Status: None   Collection Time: 11/02/22  1:03 AM   Specimen: Anterior Nasal Swab  Result Value Ref Range Status   SARS Coronavirus 2 by RT PCR NEGATIVE NEGATIVE Final    Comment: (NOTE) SARS-CoV-2 target nucleic acids are NOT DETECTED.  The SARS-CoV-2 RNA is generally detectable in upper respiratory specimens during the acute phase of infection. The lowest concentration of SARS-CoV-2 viral copies this assay can detect is 138 copies/mL. A negative result does not preclude SARS-Cov-2 infection and should not be used as the sole basis for treatment or other patient management decisions. A negative result may occur with  improper specimen collection/handling, submission of specimen other than nasopharyngeal swab, presence of viral mutation(s) within the areas targeted by this assay, and inadequate number of viral copies(<138 copies/mL). A negative result must be combined with clinical  observations, patient history, and epidemiological information. The expected result is Negative.  Fact Sheet for Patients:  BloggerCourse.comhttps://www.fda.gov/media/152166/download  Fact Sheet for Healthcare Providers:  SeriousBroker.ithttps://www.fda.gov/media/152162/download  This test is no t yet approved or cleared by the [place] FDA and  has been authorized for detection and/or diagnosis of SARS-CoV-2 by FDA under an Emergency Use Authorization (EUA). This EUA will remain  in effect (meaning this test can be used) for the duration of the COVID-19 declaration under Section 564(b)(1) of the Act, 21 U.S.C.section 360bbb-3(b)(1), unless the authorization is terminated  or revoked sooner.       Influenza A by PCR NEGATIVE NEGATIVE Final   Influenza B by PCR NEGATIVE NEGATIVE Final    Comment: (NOTE) The Xpert Xpress SARS-CoV-2/FLU/RSV plus assay is intended as an aid in the diagnosis of influenza from Nasopharyngeal swab specimens and should not be used as a sole basis for treatment. Nasal washings and aspirates are unacceptable for Xpert Xpress SARS-CoV-2/FLU/RSV testing.  Fact Sheet for Patients: BloggerCourse.comhttps://www.fda.gov/media/152166/download  Fact Sheet for Healthcare Providers: SeriousBroker.ithttps://www.fda.gov/media/152162/download  This test is not yet approved or cleared by the [place] FDA and has been authorized for detection and/or diagnosis of SARS-CoV-2 by FDA under an Emergency Use Authorization (EUA). This EUA will remain in effect (meaning this test can be used) for the duration of the COVID-19 declaration under Section 564(b)(1) of the Act, 21 U.S.C. section 360bbb-3(b)(1), unless the authorization is terminated or revoked.     Resp Syncytial Virus by PCR NEGATIVE NEGATIVE Final    Comment: (NOTE) Fact Sheet for Patients: BloggerCourse.comhttps://www.fda.gov/media/152166/download  Fact Sheet for Healthcare Providers: SeriousBroker.ithttps://www.fda.gov/media/152162/download  This test is not yet approved or cleared by  the [place] FDA and has been authorized for detection and/or diagnosis of SARS-CoV-2 by FDA under an Emergency Use Authorization (EUA). This EUA will remain in effect (meaning this test can be used) for the duration of the COVID-19 declaration under Section 564(b)(1) of the Act, 21 U.S.C. section 360bbb-3(b)(1), unless the authorization is terminated or revoked.  Performed at [facility], [address] [place], [place] [id number]   Culture, blood (routine x 2)     Status: None (Preliminary result)   Collection Time: 11/02/22  2:27 AM   Specimen: BLOOD  Result Value Ref Range Status   Specimen Description BLOOD BLOOD LEFT ARM  Final   Special Requests   Final  BOTTLES DRAWN AEROBIC AND ANAEROBIC Blood Culture results may not be optimal due to an excessive volume of blood received in culture bottles   Culture   Final    NO GROWTH 2 DAYS Performed at [facility], [address] [place], [place] [address]    Report Status PENDING  Incomplete  Culture, blood (routine x 2)     Status: None (Preliminary result)   Collection Time: 11/02/22  2:27 AM   Specimen: BLOOD  Result Value Ref Range Status   Specimen Description BLOOD BLOOD LEFT HAND  Final   Special Requests   Final    BOTTLES DRAWN AEROBIC ONLY Blood Culture results may not be optimal due to an inadequate volume of blood received in culture bottles   Culture   Final    NO GROWTH 2 DAYS Performed at [facility], [address] [place], [place] [address]    Report Status PENDING  Incomplete     Labs:  CBC: Recent Labs  Lab 11/02/22 0103 11/02/22 0405 11/03/22 0429 11/04/22 0307  WBC 8.7 7.9 9.1 6.7  NEUTROABS 5.3 6.9  --   --   HGB 10.7* 8.6* 9.4* 9.5*  HCT 34.7* 27.7* 29.8* 29.9*  MCV 96.9 97.9 96.1 96.8  PLT 188 149* 194 189   BMP &GFR Recent Labs  Lab 11/02/22 0103 11/02/22 0405 11/02/22 0504 11/03/22 0429 11/04/22 0307  NA 142 142  --  144 141  K 3.2* 2.7*  3.1* 3.6 4.0  CL 104 108  --  108 105  CO2 29 26  --  28 29  GLUCOSE 127* 159*  --  102* 102*  BUN 19 19  --  20 23  CREATININE 1.03* 1.05*  --  1.04* 1.11*  CALCIUM 8.3* 7.7*  --  8.5* 8.6*  MG 1.7 2.1  --  2.1 2.0  PHOS  --   --   --  3.6  --    Estimated Creatinine Clearance: 35.8 mL/min (A) (by C-G formula based on SCr of 1.11 mg/dL (H)). Liver & Pancreas: Recent Labs  Lab 11/02/22 0103 11/02/22 0405  AST 19 17  ALT 14 12  ALKPHOS 117 101  BILITOT 0.7 0.7  PROT 6.8 6.0*  ALBUMIN 3.6 3.0*   No results for input(s): "LIPASE", "AMYLASE" in the last 168 hours. No results for input(s): "AMMONIA" in the last 168 hours. Diabetic: No results for input(s): "HGBA1C" in the last 72 hours. No results for input(s): "GLUCAP" in the last 168 hours. Cardiac Enzymes: No results for input(s): "CKTOTAL", "CKMB", "CKMBINDEX", "TROPONINI" in the last 168 hours. No results for input(s): "PROBNP" in the last 8760 hours. Coagulation Profile: No results for input(s): "INR", "PROTIME" in the last 168 hours. Thyroid Function Tests: No results for input(s): "TSH", "T4TOTAL", "FREET4", "T3FREE", "THYROIDAB" in the last 72 hours. Lipid Profile: No results for input(s): "CHOL", "HDL", "LDLCALC", "TRIG", "CHOLHDL", "LDLDIRECT" in the last 72 hours. Anemia Panel: No results for input(s): "VITAMINB12", "FOLATE", "FERRITIN", "TIBC", "IRON", "RETICCTPCT" in the last 72 hours. Urine analysis:    Component Value Date/Time   COLORURINE STRAW (A) 05/22/2022 1700   APPEARANCEUR CLEAR (A) 05/22/2022 1700   LABSPEC 1.005 05/22/2022 1700   PHURINE 7.0 05/22/2022 1700   GLUCOSEU NEGATIVE 05/22/2022 1700   HGBUR NEGATIVE 05/22/2022 1700   BILIRUBINUR NEGATIVE 05/22/2022 1700   KETONESUR NEGATIVE 05/22/2022 1700   PROTEINUR NEGATIVE 05/22/2022 1700   NITRITE NEGATIVE 05/22/2022 1700   LEUKOCYTESUR NEGATIVE 05/22/2022 1700   Sepsis  Labs: Invalid input(s): "PROCALCITONIN",  "LACTICIDVEN"   SIGNED:  Almon Hercules, MD  [facility] 11/04/2022, 5:49 PM

## 2022-11-04 NOTE — TOC Transition Note (Signed)
Transition of Care [facility]) - CM/SW Discharge Note   Patient Details  Name: Tricia Edwards MRN: [id number] Date of Birth: [birth year]  [facility] CM/SW Contact:  Chapman Fitch, RN Phone Number: 11/04/2022, 11:54 AM   Clinical Narrative:     Patient to discharge today Nebulizer ordered.  Ronda with [employer] notified. Daughter to pick up at retail store in [place]     Barriers to Discharge: Continued Medical Work up   Patient Goals and CMS Choice     Choice offered to / list presented to : Patient, Adult Children  Discharge Placement                       Discharge Plan and Services                                     Social Determinants of Health (SDOH) Interventions     Readmission Risk Interventions     No data to display

## 2022-11-07 LAB — CULTURE, BLOOD (ROUTINE X 2)
Culture: NO GROWTH
Culture: NO GROWTH

## 2022-12-18 ENCOUNTER — Other Ambulatory Visit: Payer: Self-pay | Admitting: Medical

## 2023-01-16 ENCOUNTER — Encounter: Payer: Self-pay | Admitting: Emergency Medicine

## 2023-01-16 ENCOUNTER — Emergency Department
Admission: EM | Admit: 2023-01-16 | Discharge: 2023-01-16 | Disposition: A | Discharge status: Home / Self Care | Payer: Medicare Other | Attending: Emergency Medicine | Admitting: Emergency Medicine

## 2023-01-16 DIAGNOSIS — G500 Trigeminal neuralgia: Secondary | ICD-10-CM | POA: Insufficient documentation

## 2023-01-16 DIAGNOSIS — F419 Anxiety disorder, unspecified: Secondary | ICD-10-CM | POA: Diagnosis not present

## 2023-01-16 DIAGNOSIS — R6884 Jaw pain: Secondary | ICD-10-CM | POA: Diagnosis present

## 2023-01-16 MED ORDER — GABAPENTIN 100 MG PO CAPS
200.0000 mg | ORAL_CAPSULE | Freq: Once | ORAL | Status: AC
  Administered 2023-01-16: 200 mg via ORAL
  Filled 2023-01-16: qty 2

## 2023-01-16 MED ORDER — CLONAZEPAM 0.5 MG PO TABS: 0.5000 mg | ORAL_TABLET | Freq: Every day | ORAL | 0 refills | Status: DC | PRN

## 2023-01-16 MED ORDER — CLONAZEPAM 0.5 MG PO TABS
0.5000 mg | ORAL_TABLET | Freq: Once | ORAL | Status: AC
  Administered 2023-01-16: 0.5 mg via ORAL
  Filled 2023-01-16: qty 1

## 2023-01-16 NOTE — ED Triage Notes (Addendum)
Pt presents ambulatory to triage via POV with complaints of L sided jaw pain following a Trigeminal neuralgia attack. Hx of same. Compliant with medications. Pt rates the pain 10/10 - pt refuses to allow this RN to assess her face and jaw due to the pain.  A&Ox4 at this time. Denies CP or SOB.

## 2023-01-16 NOTE — ED Provider Notes (Signed)
[facility] Provider Note    Event Date/Time   First MD Initiated Contact with Patient 01/16/23 [phone]     (approximate)   History   Jaw Pain   HPI  Tricia Edwards is a 89 y.o. female who presents to the ED for evaluation of Jaw Pain   I review Neurology clinic visit from 1/8.  History of trigeminal neuralgia taking both gabapentin and phenytoin  Patient presents with her daughter for evaluation of acute on chronic pain.  She reports awakening from sleep tonight with severe typical trigeminal neuralgia pain that is since improved prior to arrival.  Patient reports no pain right now, but is tearful and scared that it will return.  Daughter reports that she has been having more attacks of the pain in the past couple weeks, possibly related to anticipation of the daughter leaving for a week to go on a cruise with some old friends that she has not seen in a long time.  This is expected to occur in the next couple weeks.  Daughter reports that she cares for the patient 24/7 and patient gets quite anxious when daughter is not around.  Physical Exam   Triage Vital Signs: ED Triage Vitals  Enc Vitals Group     BP 01/16/23 0024 (!) 160/101     Pulse Rate 01/16/23 0024 75     Resp 01/16/23 0024 18     Temp 01/16/23 0024 (S) 97.6 F (36.4 C)     Temp Source 01/16/23 0024 Axillary     SpO2 01/16/23 0024 98 %     Weight 01/16/23 0024 168 lb (76.2 kg)     Height 01/16/23 0024 '5\' 7"'$  (1.702 m)     Head Circumference --      Peak Flow --      Pain Score 01/16/23 0023 10     Pain Loc --      Pain Edu? --      Excl. in [place]? --     Most recent vital signs: Vitals:   01/16/23 0024 01/16/23 0418  BP: (!) 160/101 131/62  Pulse: 75 64  Resp: 18 18  Temp: (S) 97.6 F (36.4 C)   SpO2: 98% 94%    General: Awake, no distress.  CV:  Good peripheral perfusion.  Resp:  Normal effort.  Abd:  No distention.  MSK:  No deformity noted.  Neuro:  No focal deficits  appreciated.  Cranial nerves II through XII intact.  No signs of facial skin changes, trauma, rash Other:     ED Results / Procedures / Treatments   Labs (all labs ordered are listed, but only abnormal results are displayed) Labs Reviewed - No data to display  EKG   RADIOLOGY   Official radiology report(s): No results found.  PROCEDURES and INTERVENTIONS:  Procedures  Medications  clonazePAM (KLONOPIN) tablet 0.5 mg (0.5 mg Oral Given 01/16/23 0311)  gabapentin (NEURONTIN) capsule 200 mg (200 mg Oral Given 01/16/23 0311)     IMPRESSION / MDM / ASSESSMENT AND PLAN / ED COURSE  I reviewed the triage vital signs and the nursing notes.  Differential diagnosis includes, but is not limited to, trigeminal neuralgia, medication noncompliance,   {Patient presents with symptoms of an acute illness or injury that is potentially life-threatening.  Pleasant 89 year old woman presents with her daughter after an attack of her known trigeminal neuralgia.  By the time I see her, she reports the pain is resolved but she is tearful  and afraid of recurrence.  She is quite anxious and clinical picture improves dramatically with a small dose of clonazepam.  She is observed for a few hours without recurrence of the pain.  I discharged with 5 tablets of clonazepam to have as needed for any of the severe episodes but urged the daughter not to use them regularly.  We discussed reaching out to her neurologist for follow-up considering the increasing frequency of her attacks.  We discussed return precautions for the ED.  Clinical Course as of 01/16/23 0437  Thu Jan 16, 2023  0409 Reassessed.  Is calm down and feeling much better.  I talked with daughter.  We discussed management at home. [DS]    Clinical Course User Index [DS] Vladimir Crofts, MD     FINAL CLINICAL IMPRESSION(S) / ED DIAGNOSES   Final diagnoses:  Tic douloureux  Anxiety     Rx / DC Orders   ED Discharge Orders           Ordered    clonazePAM (KLONOPIN) 0.5 MG tablet  Daily PRN        01/16/23 0411             Note:  This document was prepared using Dragon voice recognition software and may include unintentional dictation errors.   Vladimir Crofts, MD 01/16/23 [phone]

## 2023-01-16 NOTE — Discharge Instructions (Addendum)
Try using Klonopin/clonazepam as needed for any of these severe episodes of anxiety.  You could even try just using 1/2 tablets (0.25 mg) per dose.  Continue all of her other medications, including phenytoin and gabapentin, as prescribed.

## 2023-03-21 IMAGING — CT CT HEAD W/O CM
4 series · 15 of 47 positions shown, 17 images · non-contrast
Comparison: None.

CLINICAL DATA: Syncope/presyncope, cerebrovascular cause suspected



[Series 2: head wo · axial · 0.44mm/px · z∈[+183,+293]mm · 7 of 30 slices shown, 9 images]
[im 4/30  brain]
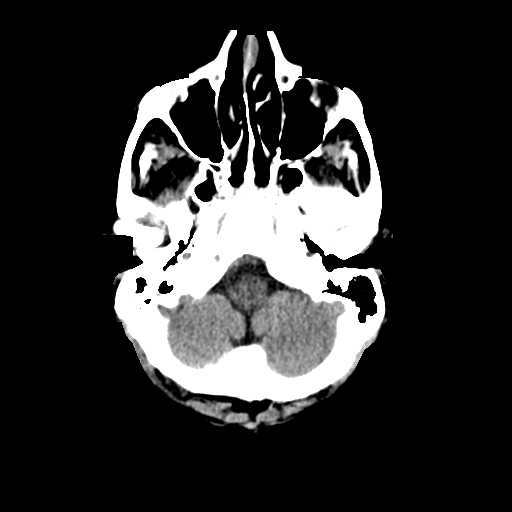
[im 4/30  bone]
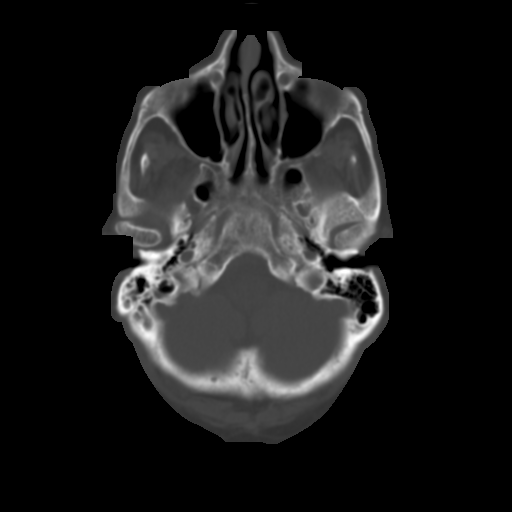
[im 8/30  brain]
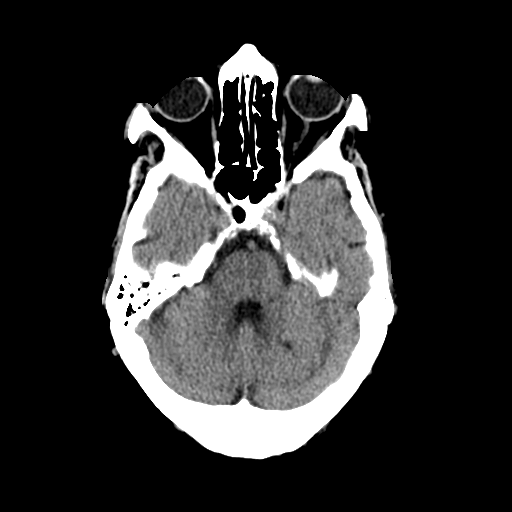
[im 11/30  brain]
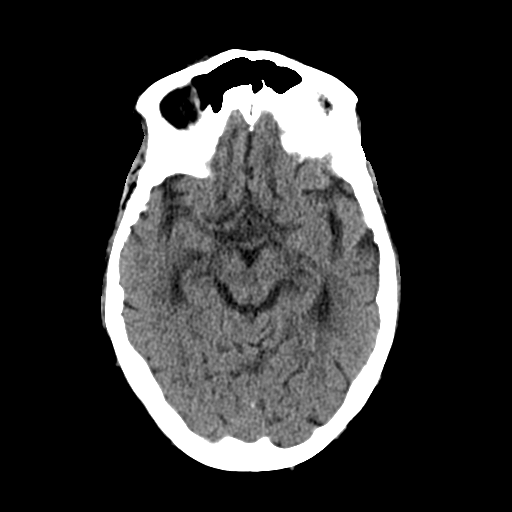
[im 15/30  brain]
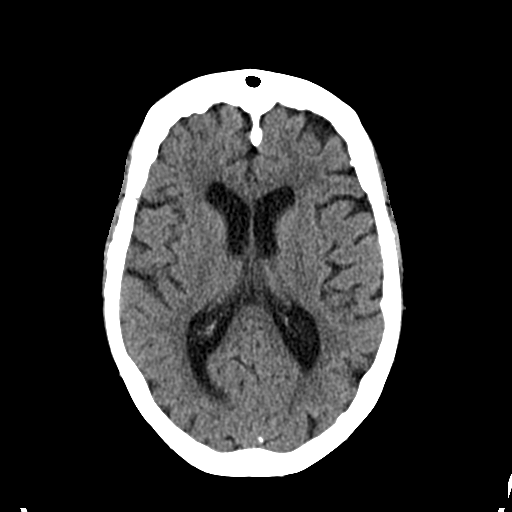
[im 19/30  brain]
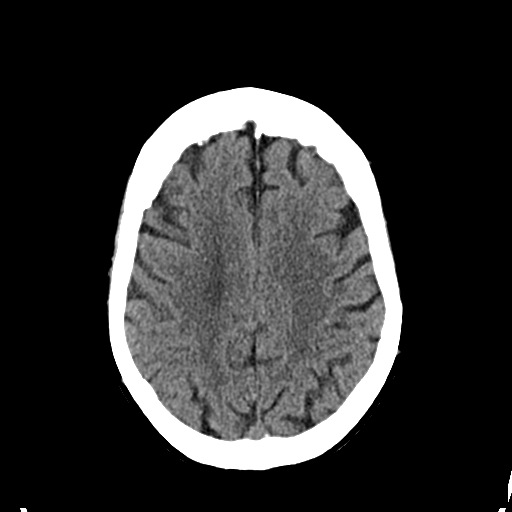
[im 19/30  bone]
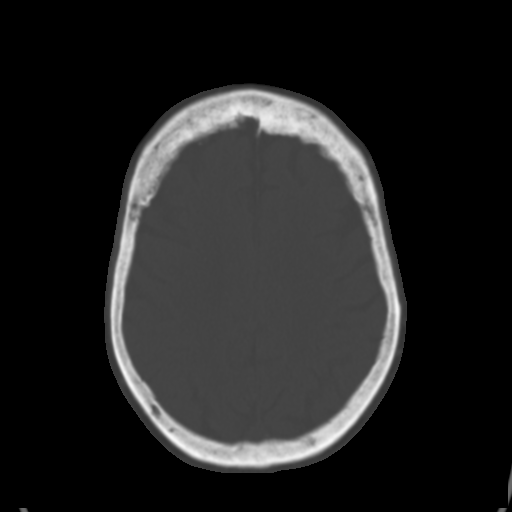
[im 22/30  brain]
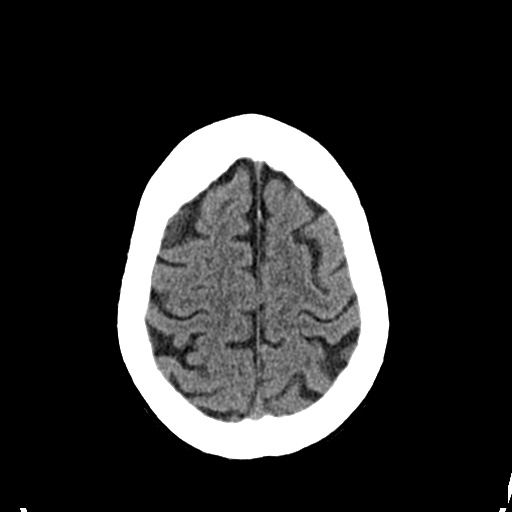
[im 26/30  brain]
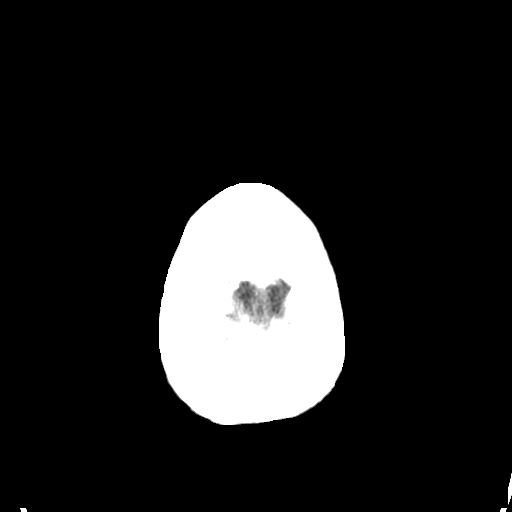

[Series 3: head bone · axial · 0.44mm/px · z∈[+182,+196]mm · 2 of 74 slices shown]
[im 8/74  bone]
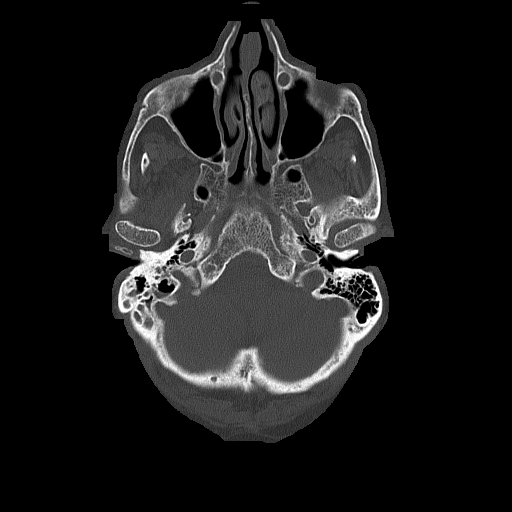
[im 15/74  bone]
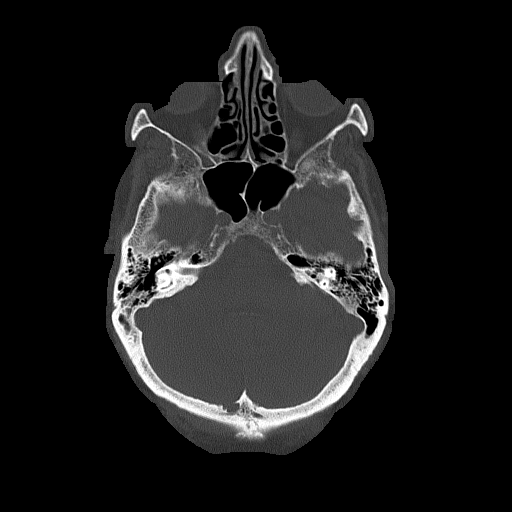

[Series 4: coronal soft tissue · coronal · 0.27mm/px · 3 of 68 slices shown]
[im 23/68  brain]
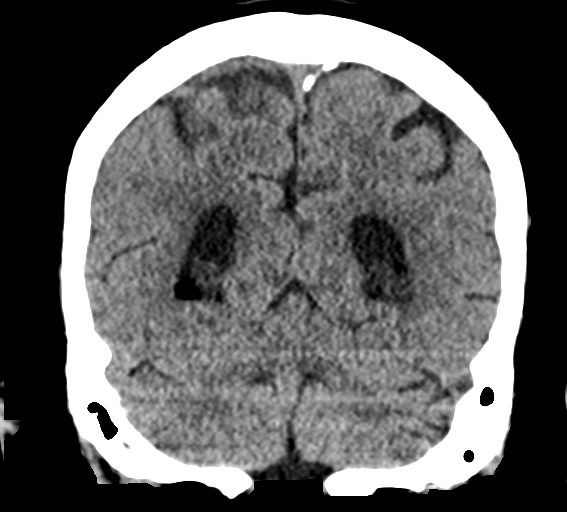
[im 30/68  brain]
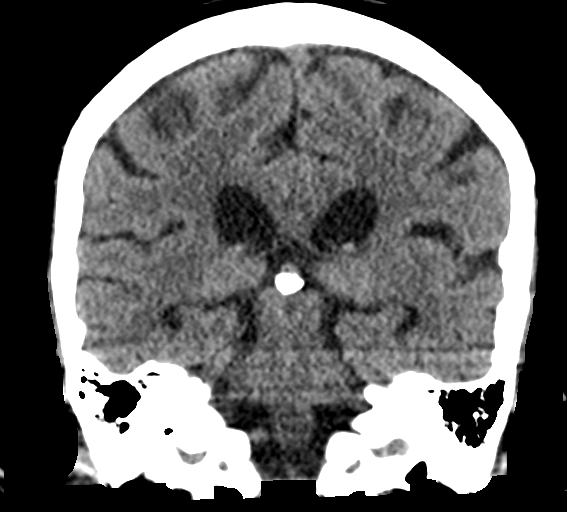
[im 38/68  brain]
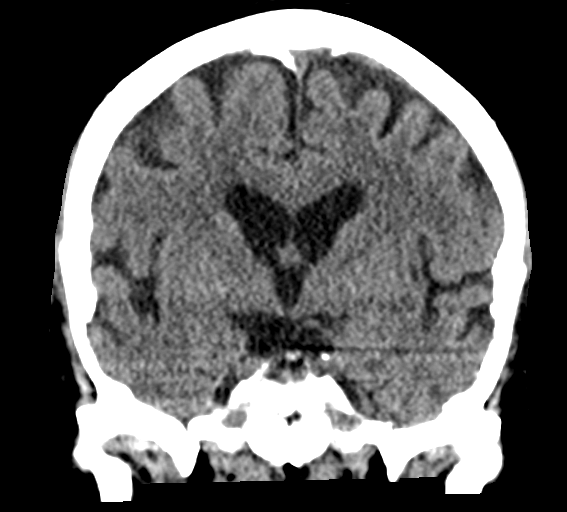

[Series 5: sagittal soft tissue · sagittal · 0.27mm/px · 3 of 50 slices shown]
[im 17/50  brain]
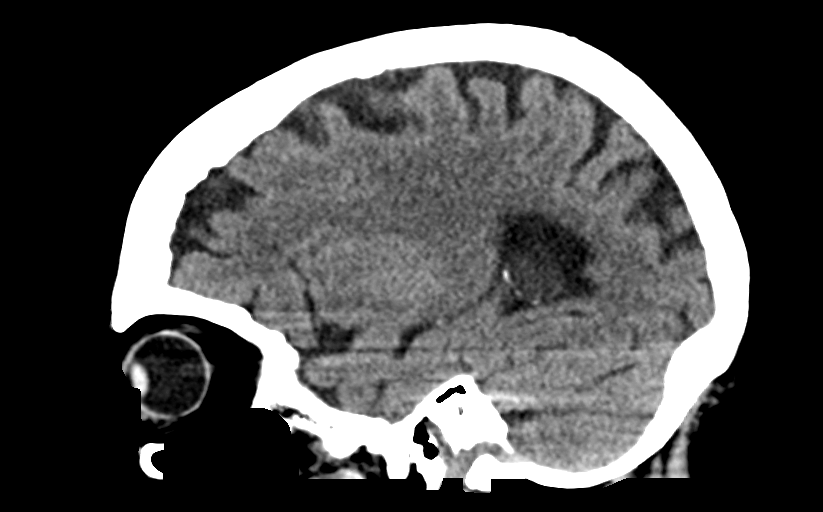
[im 25/50  brain]
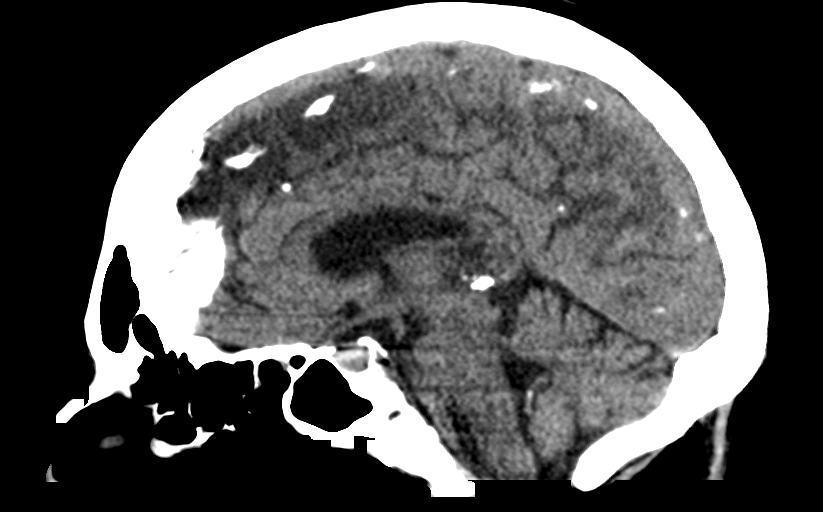
[im 33/50  brain]
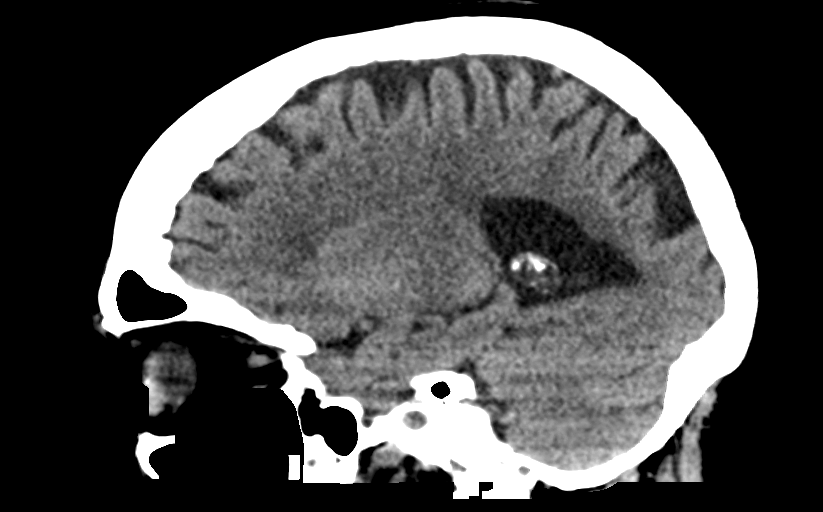

[15 of 47 positions shown; findings below may reference images not displayed]

BRAIN:
BRAIN
Cerebral ventricle sizes are concordant with the degree of cerebral
volume loss. Patchy and confluent areas of decreased attenuation are
noted throughout the deep and periventricular white matter of the
cerebral hemispheres bilaterally, compatible with chronic
microvascular ischemic disease.

No evidence of large-territorial acute infarction. No parenchymal
hemorrhage. No mass lesion. No extra-axial collection.

No mass effect or midline shift. No hydrocephalus. Basilar cisterns
are patent.

Vascular: No hyperdense vessel. Atherosclerotic calcifications are
present within the cavernous internal carotid arteries.

Skull: No acute fracture or focal lesion.

Sinuses/Orbits: Partial effusion of the right mastoid air cells. No
definite fluid within the middle ear. Otherwise paranasal sinuses
and left mastoid air cells are clear. Right lens replacement.
Otherwise the orbits are unremarkable.

Other: None.
IMPRESSION: 1. No acute intracranial abnormality.
2. Partial effusion of the right mastoid air cells.

## 2023-04-25 ENCOUNTER — Telehealth: Payer: Self-pay | Admitting: Radiation Oncology

## 2023-04-25 NOTE — Telephone Encounter (Signed)
6/7 @ 10:29 am received call from [facility] at Dr. Williemae Area office.  She stated that patient has not any recent images done.  All images done was through [facility].  Shirleen Schirmer sent to Jalene Mullet, so they are aware.

## 2023-04-25 NOTE — Telephone Encounter (Signed)
6/7 @ 9:00 am sent via stat fax request to [facility] [place] for recent images to be push to powershare or cd sent for diagnosis.  Also requested path and/or images report to be fax.  Waiting on images and reports.

## 2023-04-28 ENCOUNTER — Telehealth: Payer: Self-pay | Admitting: Cardiovascular Disease

## 2023-04-28 NOTE — Telephone Encounter (Signed)
Spoke to patient's adult child Tricia Edwards and she stated that she's been taking her medications as prescribed and holding them when advised. Istructed patient's daughter to continue to monitor symptoms and if she develops chest pain, blurred vision, shortness of breath, slurred speech then to call 911 or present to the nearest ED ASAP. Patient is scheduled Tuesday 04/29/23 @ 10:20 AM. Patient's daughter understood with read back.

## 2023-04-28 NOTE — Telephone Encounter (Signed)
Pt c/o BP issue: STAT if pt c/o blurred vision, one-sided weakness or slurred speech  1. What are your last 5 BP readings?  Not available at this time  2. Are you having any other symptoms (ex. Dizziness, headache, blurred vision, passed out)?   Yesterday patient had a little bit of headache and weight gain  3. What is your BP issue?   Daughter stated patient is having fluctuating BP reading.   Daughter stated this morning before taking medication patient's BP was 164/? laying down, 180/? sitting up and 30 mins later sitting up 170?

## 2023-04-28 NOTE — Progress Notes (Signed)
Cardiology Office Note  Date:  04/29/2023   ID:  Tricia Edwards, DOB 05-Feb-1930, MRN [id number]  PCP:  [facility]   Chief Complaint  Patient presents with   6 month follow up     Patient's daughter Tricia Edwards) states the patient's systolic blood pressure elevated with >160 most days for the past three weeks.     HPI:  89 y.o. female with medical history significant for  paroxysmal A-fib on anticoagulation, history of cardioversion orthostatic hypotension with episodes of syncope and near syncope,  chronic labile blood pressure on midodrine and fludrocortisone daily  trigeminal neuralgia on carbamazepine  brought into the ER January 08, 2022 by her daughter for evaluation after she had several syncopal episodes that were witnessed. She presents today for office follow-up for near-syncope, syncope, atrial fibrillation  Presents today with her daughter Last seen by myself in clinic December 2023 Seen at [facility] March 2024  Cardioversion October 31, 2022 Maintaining normal sinus rhythm, on Eliquis No recent falls Daughter denies near syncope or syncope  Remains on fludrocortisone 0.2 daily, has midodrine 5 up to 10 mg as needed 3 times daily Sitting pressures remained 160 up to 180s, rarely higher Gets nervous if pressure goes to 200 sitting with feet up  Spends most of her time sitting, Daughter can tell when she is having low blood pressure symptoms, gets a certain look, gets wobbly Daughter is with her 24/7  Orthostatics today,170 systolic, down to 120 systolic Drops in pressure 50 pounds today Did not take midodrine this morning  Continues to have symptoms from trigeminal neuralgia, may need to come off Eliquis for treatment  EKG personally reviewed by myself on todays visit Normal sinus rhythm rate 58 bpm no significant ST-T wave changes  No past medical tree reviewed Admitted to the hospital July 2023 atrial fibrillation with RVR Syncopal  episode with A-fib Treated with Cardizem infusion  In the emergency room September 08, 2022 for atrial fibrillation Given diltiazem 30 mg twice daily  Seen in clinic September 10, 2022 by one of our providers On that visit was taking diltiazem 30 3 times daily On that visit was in atrial fibrillation Was started on amiodarone 400 twice daily for 1 week then 200 twice daily 1 week then 200 after that  Other past medical history reviewed Admitted in July 2023 for A-fib with RVR, syncope, acute heart failure.  It was felt acute heart failure exacerbated A-fib rates.  She was initially placed on Cardizem with resolution of A-fib. Patient was given 1 dose of Lasix 20 mg.   ER visit 09/08/2022 for dizziness and A-fib.  Pulse reported 120-160.  Blood pressure was 120/92, pulse rate 126 bpm.  EKG showed A-fib with a heart rate of 121.  Patient was given injection of cortisone 5 mg and diltiazem tablet 30 mg.  Dr. Duke Salvia was consulted who recommended Cardizem 30 mg twice a day.  Seen by one of our providers October 2023  Prior syncope episodes  first episode while sitting on the commode.  Patient states that she had strained to have a bowel movement and subsequently felt dizzy and lightheaded.  Per her daughter she had a transient loss of consciousness and she took her back to her room and put her in bed. Patient had 2 more episodes while ambulatory and each time she felt dizzy and lightheaded and went limp.  Her daughter was concerned about the frequency of these episodes and states that she usually has  1 but never this many in 1 day. Patient has been on antibiotic therapy for UTI but denies having any fever, no chills, no nausea, no vomiting, no abdominal pain, no leg swelling, no headache, no cough, no palpitations, no diaphoresis, no blurred vision, no focal deficit. She received 2 L of IV fluids in the ER Presenting in atrial fibrillation, converted to sinus rhythm in the hospital after digoxin  given Family declined amiodarone  Echocardiogram showing LVH, small LV cavity, morbid obesity, deconditioned Long history of orthostasis, near syncope and syncope dating back several years, managed by cardiology in [place] -Discussed various strategies with him including taking water, midodrine first thing in the morning before getting up Avoiding hot meals, large meals especially if she has not had her midodrine -Consider abdominal binder, compression hose Staying well-hydrated Checking orthostatics on a regular basis especially before going out shopping Extra midodrine as needed for orthostatic numbers  Zio monitor Apr 10, 2022 reviewed in detail Patient had a min HR of 57 bpm, max HR of 160 bpm, and avg HR of 71 bpm.   115 Supraventricular Tachycardia runs occurred, the run with the fastest interval lasting 5 beats with a max rate of 160 bpm, the longest lasting 14 beats with an avg rate of 92 bpm. (Not patient triggered)   Isolated SVEs were rare (<1.0%), SVE Couplets were rare (<1.0%), and SVE Triplets were rare (<1.0%). Isolated VEs were occasional (1.2%, 16466), VE Couplets were rare (<1.0%, 33), and no VE Triplets  were present. Ventricular Bigeminy and Trigeminy were present.    Patient triggered events (3) associated with normal sinus rhythm, rare PVC   PMH:   has a past medical history of Hypertension and Paroxysmal A-fib (HCC).  PSH:    Past Surgical History:  Procedure Laterality Date   APPENDECTOMY     CARDIOVERSION N/A 10/31/2022   Procedure: CARDIOVERSION;  Surgeon: Antonieta Iba, MD;  Location: [facility] ORS;  Service: Cardiovascular;  Laterality: N/A;   KNEE SURGERY      Current Outpatient Medications  Medication Sig Dispense Refill   amiodarone (PACERONE) 200 MG tablet Take 1 tablet by mouth twice daily 72 tablet 7   atorvastatin (LIPITOR) 10 MG tablet Take 10 mg by mouth daily.     Calcium Carb-Cholecalciferol (CALCIUM 600/VITAMIN D PO) Take 1 tablet by  mouth in the morning and at bedtime.     ELIQUIS 5 MG TABS tablet Take 5 mg by mouth 2 (two) times daily.     ferrous sulfate 324 MG TBEC Take 324 mg by mouth 3 (three) times a week. Take 1 tablet every morning, and 1 tablet in the evening every Wednesday and Saturday     fludrocortisone (FLORINEF) 0.1 MG tablet Take 2 tablets by mouth twice daily 120 tablet 5   midodrine (PROAMATINE) 10 MG tablet Take 10 mg by mouth 3 (three) times daily.     omeprazole (PRILOSEC) 20 MG capsule Take 20 mg by mouth daily.     Oxcarbazepine (TRILEPTAL) 300 MG tablet Take 300 mg by mouth 2 (two) times daily.     potassium chloride SA (KLOR-CON M) 20 MEQ tablet Take 20 mEq by mouth daily.     senna (SENOKOT) 8.6 MG TABS tablet Take 1 tablet by mouth daily as needed for mild constipation.     clonazePAM (KLONOPIN) 0.5 MG tablet Take 1 tablet (0.5 mg total) by mouth daily as needed for anxiety. (Patient not taking: Reported on 04/29/2023) 5 tablet 0   fluticasone-salmeterol (  ADVAIR DISKUS) 250-50 MCG/ACT AEPB Inhale 1 puff into the lungs in the morning and at bedtime. (Patient not taking: Reported on 04/29/2023) 1 each 1   gabapentin (NEURONTIN) 300 MG capsule Take 300 mg by mouth 2 (two) times daily. (Patient not taking: Reported on 04/29/2023)     levalbuterol (XOPENEX HFA) 45 MCG/ACT inhaler Inhale 1 puff into the lungs every 6 (six) hours as needed for wheezing or shortness of breath. (Patient not taking: Reported on 04/29/2023) 1 each 2   phenytoin (DILANTIN) 100 MG ER capsule Take 100-200 mg by mouth 2 (two) times daily. Take 200 mg every other morning alternating with 100 mg, and 100 mg capsule daily in the evening (Patient not taking: Reported on 04/29/2023)     No current facility-administered medications for this visit.    Allergies:   Pneumovax [pneumococcal polysaccharide vaccine] and Pneumococcal vaccine   Social History:  The patient  reports that she has quit smoking. She has never used smokeless tobacco.  She reports that she does not currently use alcohol. She reports that she does not currently use drugs.   Family History:   family history includes Intracerebral hemorrhage in her brother; Kidney cancer in her sister.    Review of Systems: Review of Systems  Constitutional: Negative.   HENT: Negative.    Respiratory: Negative.    Cardiovascular: Negative.   Gastrointestinal: Negative.   Musculoskeletal: Negative.   Neurological: Negative.   Psychiatric/Behavioral: Negative.    All other systems reviewed and are negative.    PHYSICAL EXAM: VS:  BP (!) 160/70 (BP Location: Right Arm, Patient Position: Sitting, Cuff Size: Normal)   Pulse (!) 58   Ht 5\' 6"  (1.676 m)   Wt 171 lb 4 oz (77.7 kg)   SpO2 97%   BMI 27.64 kg/m  , BMI Body mass index is 27.64 kg/m. Constitutional:  oriented to person, place, and time. No distress.  In a wheelchair HENT:  Head: Grossly normal Eyes:  no discharge. No scleral icterus.  Neck: No JVD, no carotid bruits  Cardiovascular: Irregularly irregular, no murmurs appreciated Pulmonary/Chest: Clear to auscultation bilaterally, no wheezes or rails Abdominal: Soft.  no distension.  no tenderness.  Musculoskeletal: Normal range of motion Neurological:  normal muscle tone. Coordination normal. No atrophy Skin: Skin warm and dry Psychiatric: normal affect, pleasant  Recent Labs: 09/08/2022: TSH 3.086 11/02/2022: ALT 12; B Natriuretic Peptide 524.7 11/04/2022: BUN 23; Creatinine, Ser 1.11; Hemoglobin 9.5; Magnesium 2.0; Platelets 189; Potassium 4.0; Sodium 141   Lipid Panel No results found for: "CHOL", "HDL", "LDLCALC", "TRIG"    Wt Readings from Last 3 Encounters:  04/29/23 171 lb 4 oz (77.7 kg)  01/16/23 168 lb (76.2 kg)  11/02/22 167 lb (75.8 kg)     ASSESSMENT AND PLAN:  Problem List Items Addressed This Visit       Cardiology Problems   Persistent atrial fibrillation (HCC) - Primary   Chronic diastolic congestive heart failure (HCC)    Chronic orthostatic hypotension     Other   Iron deficiency anemia   Syncope and collapse   Other Visit Diagnoses     Hyperlipidemia, mixed         Paroxysmal atrial fibrillation Tolerating Eliquis, no recent falls Prior cardioversion Maintaining normal sinus rhythm, continue amiodarone 200 daily  Orthostatic hypotension Continues to have symptoms, perhaps less symptomatic with behavior modification, wears thigh-high compressions, hydrates, and in in the normal sinus rhythm  On today's visit did not take midodrine, dropping 50  points with standing, mildly symptomatic Recommend she follow midodrine regimen as below For systolic pressure <150, take midodrine 10 mg  For systolic pressure >150 to 160, take midodire 5 mg For systolic pressure >160, hold midodrine  Anemia On Eliquis, hemoglobin stable   Total encounter time more than 40 minutes  Greater than 50% was spent in counseling and coordination of care with the patient    Signed, Dossie Arbour, M.D., Ph.D. [facility] [place], [place] [phone]

## 2023-04-28 NOTE — Progress Notes (Signed)
Head and Neck Cancer Location of Tumor / Histology: Trigeminal Neuralgia Left side face    Nutrition Status Yes No Comments  Weight changes? []  [x]  Gain  Swallowing concerns? []  [x]    PEG? []  [x]     Referrals Yes No Comments  Social Work? []  [x]    Dentistry? []  [x]    Swallowing therapy? []  [x]    Nutrition? []  [x]    Med/Onc? []  [x]     Safety Issues Yes No Comments  Prior radiation? []  [x]    Pacemaker/ICD? []  [x]    Possible current pregnancy? []  [x]   Postmenopausal  Is the patient on methotrexate? []  [x]     Tobacco/Marijuana/Snuff/ETOH use: Quit [phone]), no marijuana, snuff, or alcohol use.  Past/Anticipated interventions by otolaryngology, if any: NA  Past/Anticipated interventions by medical oncology, if any: NA   Current Complaints / other details:  No

## 2023-04-29 ENCOUNTER — Other Ambulatory Visit: Payer: Self-pay

## 2023-04-29 ENCOUNTER — Encounter: Payer: Self-pay | Admitting: Cardiovascular Disease

## 2023-04-29 ENCOUNTER — Ambulatory Visit
Admission: RE | Admit: 2023-04-29 | Discharge: 2023-04-29 | Disposition: A | Discharge status: Home / Self Care | Payer: Medicare Other | Source: Ambulatory Visit | Attending: Radiation Oncology | Admitting: Radiation Oncology

## 2023-04-29 ENCOUNTER — Encounter: Payer: Self-pay | Admitting: Radiation Oncology

## 2023-04-29 ENCOUNTER — Ambulatory Visit (INDEPENDENT_AMBULATORY_CARE_PROVIDER_SITE_OTHER): Payer: Medicare Other | Admitting: Cardiovascular Disease

## 2023-04-29 VITALS — BP 137/57 | HR 59 | Temp 97.8°F | Resp 20 | Ht 67.0 in | Wt 172.0 lb

## 2023-04-29 VITALS — BP 130/60 | HR 60 | Ht 66.0 in | Wt 171.2 lb

## 2023-04-29 DIAGNOSIS — Z87891 Personal history of nicotine dependence: Secondary | ICD-10-CM | POA: Insufficient documentation

## 2023-04-29 DIAGNOSIS — I4819 Other persistent atrial fibrillation: Secondary | ICD-10-CM | POA: Insufficient documentation

## 2023-04-29 DIAGNOSIS — I480 Paroxysmal atrial fibrillation: Secondary | ICD-10-CM | POA: Insufficient documentation

## 2023-04-29 DIAGNOSIS — G500 Trigeminal neuralgia: Secondary | ICD-10-CM | POA: Diagnosis present

## 2023-04-29 DIAGNOSIS — R55 Syncope and collapse: Secondary | ICD-10-CM | POA: Insufficient documentation

## 2023-04-29 DIAGNOSIS — R6884 Jaw pain: Secondary | ICD-10-CM | POA: Diagnosis not present

## 2023-04-29 DIAGNOSIS — E782 Mixed hyperlipidemia: Secondary | ICD-10-CM | POA: Insufficient documentation

## 2023-04-29 DIAGNOSIS — Z7951 Long term (current) use of inhaled steroids: Secondary | ICD-10-CM | POA: Diagnosis not present

## 2023-04-29 DIAGNOSIS — I5032 Chronic diastolic (congestive) heart failure: Secondary | ICD-10-CM | POA: Insufficient documentation

## 2023-04-29 DIAGNOSIS — I10 Essential (primary) hypertension: Secondary | ICD-10-CM | POA: Insufficient documentation

## 2023-04-29 DIAGNOSIS — D509 Iron deficiency anemia, unspecified: Secondary | ICD-10-CM | POA: Insufficient documentation

## 2023-04-29 DIAGNOSIS — I951 Orthostatic hypotension: Secondary | ICD-10-CM | POA: Insufficient documentation

## 2023-04-29 DIAGNOSIS — Z79899 Other long term (current) drug therapy: Secondary | ICD-10-CM | POA: Diagnosis not present

## 2023-04-29 DIAGNOSIS — Z7901 Long term (current) use of anticoagulants: Secondary | ICD-10-CM | POA: Diagnosis not present

## 2023-04-29 MED ORDER — FLUDROCORTISONE ACETATE 0.1 MG PO TABS: 200.0000 ug | ORAL_TABLET | Freq: Two times a day (BID) | ORAL | 6 refills | Status: DC

## 2023-04-29 MED ORDER — AMIODARONE HCL 200 MG PO TABS: 200.0000 mg | ORAL_TABLET | Freq: Every day | ORAL | 3 refills | Status: DC

## 2023-04-29 NOTE — Patient Instructions (Addendum)
Medication Instructions:  For systolic pressure <150, take midodrine 10 mg  For systolic pressure >150 to 160, take midodire 5 mg For systolic pressure >160, hold midodrine  If you need a refill on your cardiac medications before your next appointment, please call your pharmacy.   Lab work: No new labs needed  Testing/Procedures: No new testing needed  Follow-Up: At [facility], you and your health needs are our priority.  As part of our continuing mission to provide you with exceptional heart care, we have created designated Provider Care Teams.  These Care Teams include your primary Cardiologist (physician) and Advanced Practice Providers (APPs -  Physician Assistants and Nurse Practitioners) who all work together to provide you with the care you need, when you need it.  You will need a follow up appointment in 6 months  Providers on your designated Care Team:   Nicolasa Ducking, NP Eula Listen, PA-C Cadence Fransico Michael, [place]  COVID-19 Vaccine Information can be found at: [email or web address] For questions related to vaccine distribution or appointments, please email [email or web address] or call [phone].

## 2023-04-29 NOTE — Progress Notes (Signed)
Radiation Oncology         [phone] ________________________________  Initial outpatient Consultation  Name: Tricia Edwards MRN: [id number]  Date of Service: 04/29/2023 DOB: [birth year]  [id number]  Coletta Memos, MD   REFERRING PHYSICIAN: Coletta Memos, MD  DIAGNOSIS: The encounter diagnosis was Trigeminal neuralgia of left side of face.    ICD-10-CM   1. Trigeminal neuralgia of left side of face  G50.0       HISTORY OF PRESENT ILLNESS: Tricia Edwards is a 89 y.o. female seen at the request of Dr. Franky Macho. She was initially diagnosed with trigeminal neuralgia in 2017 after presenting with months of left-sided facial and upper jaw pain. She was previously on Carbamazepine, Lamictal, and Dilantin. She is currently on gabapentin and Oxcarbazepine, started in 01/2023 due to decrease in efficacy of prior medications. Given the decreasing medical management options, she was referred to Dr. Franky Macho for other treatment options.  PREVIOUS RADIATION THERAPY: No  PAST MEDICAL HISTORY:  Past Medical History:  Diagnosis Date   Hypertension    Paroxysmal A-fib (HCC)       PAST SURGICAL HISTORY: Past Surgical History:  Procedure Laterality Date   APPENDECTOMY     CARDIOVERSION N/A 10/31/2022   Procedure: CARDIOVERSION;  Surgeon: Antonieta Iba, MD;  Location: [facility] ORS;  Service: Cardiovascular;  Laterality: N/A;   KNEE SURGERY      FAMILY HISTORY:  Family History  Problem Relation Age of Onset   Kidney cancer Sister    Intracerebral hemorrhage Brother     SOCIAL HISTORY:  Social History   Socioeconomic History   Marital status: Widowed    Spouse name: Not on file   Number of children: Not on file   Years of education: Not on file   Highest education level: Not on file  Occupational History   Not on file  Tobacco Use   Smoking status: Former   Smokeless tobacco: Never  Vaping Use   Vaping Use: Never used  Substance and Sexual Activity    Alcohol use: Not Currently   Drug use: Not Currently   Sexual activity: Not Currently  Other Topics Concern   Not on file  Social History Narrative   Not on file   Social Determinants of Health   Financial Resource Strain: Not on file  Food Insecurity: No Food Insecurity (04/29/2023)   Hunger Vital Sign    Worried About Running Out of Food in the Last Year: Never true    Ran Out of Food in the Last Year: Never true  Transportation Needs: No Transportation Needs (04/29/2023)   PRAPARE - Administrator, Civil Service (Medical): No    Lack of Transportation (Non-Medical): No  Physical Activity: Not on file  Stress: Not on file  Social Connections: Not on file  Intimate Partner Violence: Not At Risk (04/29/2023)   Humiliation, Afraid, Rape, and Kick questionnaire    Fear of Current or Ex-Partner: No    Emotionally Abused: No    Physically Abused: No    Sexually Abused: No    ALLERGIES: Pneumovax [pneumococcal polysaccharide vaccine] and Pneumococcal vaccine  MEDICATIONS:  Current Outpatient Medications  Medication Sig Dispense Refill   amiodarone (PACERONE) 200 MG tablet Take 1 tablet (200 mg total) by mouth daily. 90 tablet 3   atorvastatin (LIPITOR) 10 MG tablet Take 10 mg by mouth daily.     Calcium Carb-Cholecalciferol (CALCIUM 600/VITAMIN D PO) Take 1 tablet by mouth in the morning and  at bedtime.     clonazePAM (KLONOPIN) 0.5 MG tablet Take 1 tablet (0.5 mg total) by mouth daily as needed for anxiety. (Patient not taking: Reported on 04/29/2023) 5 tablet 0   ELIQUIS 5 MG TABS tablet Take 5 mg by mouth 2 (two) times daily.     ferrous sulfate 324 MG TBEC Take 324 mg by mouth 3 (three) times a week. Take 1 tablet every morning, and 1 tablet in the evening every Wednesday and Saturday     fludrocortisone (FLORINEF) 0.1 MG tablet Take 2 tablets (200 mcg total) by mouth 2 (two) times daily. 120 tablet 6   fluticasone-salmeterol (ADVAIR DISKUS) 250-50 MCG/ACT AEPB Inhale  1 puff into the lungs in the morning and at bedtime. (Patient not taking: Reported on 04/29/2023) 1 each 1   gabapentin (NEURONTIN) 300 MG capsule Take 300 mg by mouth 2 (two) times daily. (Patient not taking: Reported on 04/29/2023)     levalbuterol (XOPENEX HFA) 45 MCG/ACT inhaler Inhale 1 puff into the lungs every 6 (six) hours as needed for wheezing or shortness of breath. (Patient not taking: Reported on 04/29/2023) 1 each 2   midodrine (PROAMATINE) 10 MG tablet Take 10 mg by mouth 3 (three) times daily.     omeprazole (PRILOSEC) 20 MG capsule Take 20 mg by mouth daily.     Oxcarbazepine (TRILEPTAL) 300 MG tablet Take 300 mg by mouth 2 (two) times daily.     phenytoin (DILANTIN) 100 MG ER capsule Take 100-200 mg by mouth 2 (two) times daily. Take 200 mg every other morning alternating with 100 mg, and 100 mg capsule daily in the evening (Patient not taking: Reported on 04/29/2023)     polyethylene glycol powder (MIRALAX) 17 GM/SCOOP powder as directed Orally     potassium chloride SA (KLOR-CON M) 20 MEQ tablet Take 20 mEq by mouth daily.     senna (SENOKOT) 8.6 MG TABS tablet Take 1 tablet by mouth daily as needed for mild constipation.     No current facility-administered medications for this encounter.    REVIEW OF SYSTEMS:  On review of systems, the patient reports that she is doing well overall. She denies any chest pain, shortness of breath, cough, fevers, chills, night sweats, unintended weight changes. She denies any bowel or bladder disturbances, and denies abdominal pain, nausea or vomiting. She denies any new musculoskeletal or joint aches or pains. She does have episodes of severe left facial pain, sometimes moderate pain multiple episodes per day and sometimes severe lasting days.  A complete review of systems is obtained and is otherwise negative.    PHYSICAL EXAM:  Wt Readings from Last 3 Encounters:  04/29/23 172 lb (78 kg)  04/29/23 171 lb 4 oz (77.7 kg)  01/16/23 168 lb (76.2  kg)   Temp Readings from Last 3 Encounters:  04/29/23 97.8 F (36.6 C)  01/16/23 (S) 97.6 F (36.4 C) (Axillary)  11/04/22 98.4 F (36.9 C) (Oral)   BP Readings from Last 3 Encounters:  04/29/23 (!) 137/57  04/29/23 130/60  01/16/23 131/62   Pulse Readings from Last 3 Encounters:  04/29/23 (!) 59  04/29/23 60  01/16/23 64   Pain Assessment Pain Score: 0-No pain/10  In general this is a well appearing elderly woman in no acute distress. She's alert and oriented x4 and appropriate throughout the examination. Cardiopulmonary assessment is negative for acute distress and she exhibits normal effort.     KPS = 80  100 - Normal; no complaints;  no evidence of disease. 90   - Able to carry on normal activity; minor signs or symptoms of disease. 80   - Normal activity with effort; some signs or symptoms of disease. 12   - Cares for self; unable to carry on normal activity or to do active work. 60   - Requires occasional assistance, but is able to care for most of his personal needs. 50   - Requires considerable assistance and frequent medical care. 40   - Disabled; requires special care and assistance. 30   - Severely disabled; hospital admission is indicated although death not imminent. 20   - Very sick; hospital admission necessary; active supportive treatment necessary. 10   - Moribund; fatal processes progressing rapidly. 0     - Dead  Karnofsky DA, Abelmann WH, Craver LS and Burchenal [facility] [phone]) The use of the nitrogen mustards in the palliative treatment of carcinoma: with particular reference to bronchogenic carcinoma Cancer 1 634-56  LABORATORY DATA:  Lab Results  Component Value Date   WBC 6.7 11/04/2022   HGB 9.5 (L) 11/04/2022   HCT 29.9 (L) 11/04/2022   MCV 96.8 11/04/2022   PLT 189 11/04/2022   Lab Results  Component Value Date   NA 141 11/04/2022   K 4.0 11/04/2022   CL 105 11/04/2022   CO2 29 11/04/2022   Lab Results  Component Value Date   ALT 12  11/02/2022   AST 17 11/02/2022   ALKPHOS 101 11/02/2022   BILITOT 0.7 11/02/2022     RADIOGRAPHY: No results found.    IMPRESSION/PLAN: 1. 89 y.o. woman with trigeminal neuralgia  Today, we talked to the patient and family about the findings and workup thus far. We discussed the natural history of trigeminal neuralgia and general treatment, highlighting the role of radiotherapy in the management. We discussed the available radiation techniques, and focused on the details and logistics of delivery. We reviewed the anticipated acute and late sequelae associated with radiation in this setting. The patient was encouraged to ask questions that were answered to her satisfaction.  At the end of our conversation, the patient is interested in considering her options before deciding on a course of action.  She splits her time between [place].  In [place], she has medical care at the [facility].  She is also considering getting a second opinion at [facility].   I personally spent 60 minutes in this encounter including chart review, reviewing radiological studies, meeting face-to-face with the patient, entering orders and completing documentation.    Marguarite Arbour, PA-C    Margaretmary Dys, MD  [facility]  Radiation Oncology Direct Dial: [phone]  Fax: [phone] [email or web address]  Skype  [email or web address]   This document serves as a record of services personally performed by Margaretmary Dys, MD and Marcello Fennel, PA-C. It was created on their behalf by Mickie Bail, a trained medical scribe. The creation of this record is based on the scribe's personal observations and the provider's statements to them. This document has been checked and approved by the attending provider.

## 2023-05-31 ENCOUNTER — Inpatient Hospital Stay
Admission: EM | Admit: 2023-05-31 | Discharge: 2023-06-02 | DRG: 811 | Disposition: A | Discharge status: Home / Self Care | Payer: Medicare Other | Attending: Internal Medicine | Admitting: Internal Medicine

## 2023-05-31 ENCOUNTER — Emergency Department: Payer: Medicare Other

## 2023-05-31 ENCOUNTER — Encounter: Payer: Self-pay | Admitting: Emergency Medicine

## 2023-05-31 DIAGNOSIS — I130 Hypertensive heart and chronic kidney disease with heart failure and stage 1 through stage 4 chronic kidney disease, or unspecified chronic kidney disease: Secondary | ICD-10-CM | POA: Diagnosis present

## 2023-05-31 DIAGNOSIS — J449 Chronic obstructive pulmonary disease, unspecified: Secondary | ICD-10-CM | POA: Diagnosis present

## 2023-05-31 DIAGNOSIS — D649 Anemia, unspecified: Principal | ICD-10-CM | POA: Diagnosis present

## 2023-05-31 DIAGNOSIS — Z79899 Other long term (current) drug therapy: Secondary | ICD-10-CM | POA: Diagnosis not present

## 2023-05-31 DIAGNOSIS — I10 Essential (primary) hypertension: Secondary | ICD-10-CM | POA: Diagnosis not present

## 2023-05-31 DIAGNOSIS — D509 Iron deficiency anemia, unspecified: Principal | ICD-10-CM | POA: Diagnosis present

## 2023-05-31 DIAGNOSIS — Z7951 Long term (current) use of inhaled steroids: Secondary | ICD-10-CM | POA: Diagnosis not present

## 2023-05-31 DIAGNOSIS — Z87891 Personal history of nicotine dependence: Secondary | ICD-10-CM

## 2023-05-31 DIAGNOSIS — G500 Trigeminal neuralgia: Secondary | ICD-10-CM | POA: Diagnosis present

## 2023-05-31 DIAGNOSIS — Z8051 Family history of malignant neoplasm of kidney: Secondary | ICD-10-CM

## 2023-05-31 DIAGNOSIS — E876 Hypokalemia: Secondary | ICD-10-CM | POA: Diagnosis present

## 2023-05-31 DIAGNOSIS — I5033 Acute on chronic diastolic (congestive) heart failure: Secondary | ICD-10-CM | POA: Diagnosis not present

## 2023-05-31 DIAGNOSIS — I480 Paroxysmal atrial fibrillation: Secondary | ICD-10-CM | POA: Diagnosis present

## 2023-05-31 DIAGNOSIS — I482 Chronic atrial fibrillation, unspecified: Secondary | ICD-10-CM | POA: Diagnosis not present

## 2023-05-31 DIAGNOSIS — Z7901 Long term (current) use of anticoagulants: Secondary | ICD-10-CM

## 2023-05-31 DIAGNOSIS — Z66 Do not resuscitate: Secondary | ICD-10-CM | POA: Diagnosis present

## 2023-05-31 DIAGNOSIS — K5900 Constipation, unspecified: Secondary | ICD-10-CM | POA: Diagnosis present

## 2023-05-31 DIAGNOSIS — I951 Orthostatic hypotension: Secondary | ICD-10-CM | POA: Diagnosis present

## 2023-05-31 DIAGNOSIS — N1831 Chronic kidney disease, stage 3a: Secondary | ICD-10-CM | POA: Diagnosis not present

## 2023-05-31 DIAGNOSIS — I5031 Acute diastolic (congestive) heart failure: Secondary | ICD-10-CM | POA: Diagnosis not present

## 2023-05-31 DIAGNOSIS — Z887 Allergy status to serum and vaccine status: Secondary | ICD-10-CM

## 2023-05-31 DIAGNOSIS — E785 Hyperlipidemia, unspecified: Secondary | ICD-10-CM | POA: Diagnosis present

## 2023-05-31 HISTORY — DX: Essential (primary) hypertension: I10

## 2023-05-31 HISTORY — DX: Chronic obstructive pulmonary disease, unspecified: J44.9

## 2023-05-31 LAB — BASIC METABOLIC PANEL
Anion gap: 9 (ref 5–15)
BUN: 20 mg/dL (ref 8–23)
CO2: 29 mmol/L (ref 22–32)
Calcium: 8.1 mg/dL — ABNORMAL LOW (ref 8.9–10.3)
Chloride: 104 mmol/L (ref 98–111)
Creatinine, Ser: 1.09 mg/dL — ABNORMAL HIGH (ref 0.44–1.00)
GFR, Estimated: 48 mL/min — ABNORMAL LOW (ref >60)
Glucose, Bld: 106 mg/dL — ABNORMAL HIGH (ref 70–99)
Potassium: 3.2 mmol/L — ABNORMAL LOW (ref 3.5–5.1)
Sodium: 142 mmol/L (ref 135–145)

## 2023-05-31 LAB — CBC
HCT: 25.4 % — ABNORMAL LOW (ref 36.0–46.0)
HCT: 24.8 % — ABNORMAL LOW (ref 36.0–46.0)
Hemoglobin: 7.7 g/dL — ABNORMAL LOW (ref 12.0–15.0)
Hemoglobin: 7.5 g/dL — ABNORMAL LOW (ref 12.0–15.0)
MCH: 29.8 pg (ref 26.0–34.0)
MCH: 29.9 pg (ref 26.0–34.0)
MCHC: 30.3 g/dL (ref 30.0–36.0)
MCHC: 30.2 g/dL (ref 30.0–36.0)
MCV: 98.4 fL (ref 80.0–100.0)
MCV: 98.8 fL (ref 80.0–100.0)
Platelets: 265 10*3/uL (ref 150–400)
Platelets: 262 10*3/uL (ref 150–400)
RBC: 2.58 MIL/uL — ABNORMAL LOW (ref 3.87–5.11)
RBC: 2.51 MIL/uL — ABNORMAL LOW (ref 3.87–5.11)
RDW: 15 % (ref 11.5–15.5)
RDW: 15.1 % (ref 11.5–15.5)
WBC: 5.2 10*3/uL (ref 4.0–10.5)
WBC: 5 10*3/uL (ref 4.0–10.5)
nRBC: 0 % (ref 0.0–0.2)
nRBC: 0 % (ref 0.0–0.2)

## 2023-05-31 LAB — MAGNESIUM: Magnesium: 2.1 mg/dL (ref 1.7–2.4)

## 2023-05-31 LAB — CBC WITH DIFFERENTIAL/PLATELET
Abs Immature Granulocytes: 0.02 10*3/uL (ref 0.00–0.07)
Basophils Absolute: 0 10*3/uL (ref 0.0–0.1)
Basophils Relative: 1 %
Eosinophils Absolute: 0.2 10*3/uL (ref 0.0–0.5)
Eosinophils Relative: 3 %
HCT: 21 % — ABNORMAL LOW (ref 36.0–46.0)
Hemoglobin: 6.2 g/dL — ABNORMAL LOW (ref 12.0–15.0)
Immature Granulocytes: 0 %
Lymphocytes Relative: 17 %
Lymphs Abs: 1 10*3/uL (ref 0.7–4.0)
MCH: 30.2 pg (ref 26.0–34.0)
MCHC: 29.5 g/dL — ABNORMAL LOW (ref 30.0–36.0)
MCV: 102.4 fL — ABNORMAL HIGH (ref 80.0–100.0)
Monocytes Absolute: 0.7 10*3/uL (ref 0.1–1.0)
Monocytes Relative: 13 %
Neutro Abs: 3.7 10*3/uL (ref 1.7–7.7)
Neutrophils Relative %: 66 %
Platelets: 269 10*3/uL (ref 150–400)
RBC: 2.05 MIL/uL — ABNORMAL LOW (ref 3.87–5.11)
RDW: 14.3 % (ref 11.5–15.5)
WBC: 5.6 10*3/uL (ref 4.0–10.5)
nRBC: 0 % (ref 0.0–0.2)

## 2023-05-31 LAB — RETICULOCYTES
Immature Retic Fract: 26.4 % — ABNORMAL HIGH (ref 2.3–15.9)
RBC.: 2.52 MIL/uL — ABNORMAL LOW (ref 3.87–5.11)
Retic Count, Absolute: 162.8 10*3/uL (ref 19.0–186.0)
Retic Ct Pct: 6.5 % — ABNORMAL HIGH (ref 0.4–3.1)

## 2023-05-31 LAB — BRAIN NATRIURETIC PEPTIDE: B Natriuretic Peptide: 653.3 pg/mL — ABNORMAL HIGH (ref 0.0–100.0)

## 2023-05-31 LAB — PREPARE RBC (CROSSMATCH)

## 2023-05-31 LAB — APTT: aPTT: 29 seconds (ref 24–36)

## 2023-05-31 LAB — IRON AND TIBC
Iron: 32 ug/dL (ref 28–170)
Saturation Ratios: 9 % — ABNORMAL LOW (ref 10.4–31.8)
TIBC: 350 ug/dL (ref 250–450)
UIBC: 318 ug/dL

## 2023-05-31 LAB — FOLATE: Folate: 10.4 ng/mL (ref >5.9)

## 2023-05-31 LAB — TROPONIN I (HIGH SENSITIVITY): Troponin I (High Sensitivity): 9 ng/L (ref <18)

## 2023-05-31 LAB — PROTIME-INR
INR: 1.3 — ABNORMAL HIGH (ref 0.8–1.2)
Prothrombin Time: 16.2 seconds — ABNORMAL HIGH (ref 11.4–15.2)

## 2023-05-31 LAB — FERRITIN: Ferritin: 9 ng/mL — ABNORMAL LOW (ref 11–307)

## 2023-05-31 LAB — ABO/RH: ABO/RH(D): O POS

## 2023-05-31 MED ORDER — PANTOPRAZOLE SODIUM 40 MG IV SOLR: 40.0000 mg | Freq: Once | INTRAVENOUS | Status: DC

## 2023-05-31 MED ORDER — AMIODARONE HCL 200 MG PO TABS
200.0000 mg | ORAL_TABLET | Freq: Every day | ORAL | Status: DC
  Administered 2023-06-01 – 2023-06-02 (×2): 200 mg via ORAL
  Filled 2023-05-31 (×2): qty 1

## 2023-05-31 MED ORDER — ACETAMINOPHEN 325 MG PO TABS: 650.0000 mg | ORAL_TABLET | Freq: Four times a day (QID) | ORAL | Status: DC | PRN

## 2023-05-31 MED ORDER — POTASSIUM CHLORIDE CRYS ER 20 MEQ PO TBCR
40.0000 meq | EXTENDED_RELEASE_TABLET | ORAL | Status: AC
  Administered 2023-05-31 (×2): 40 meq via ORAL
  Filled 2023-05-31 (×2): qty 2

## 2023-05-31 MED ORDER — FUROSEMIDE 10 MG/ML IJ SOLN
40.0000 mg | Freq: Two times a day (BID) | INTRAMUSCULAR | Status: DC
  Administered 2023-05-31 – 2023-06-01 (×3): 40 mg via INTRAVENOUS
  Filled 2023-05-31 (×3): qty 4

## 2023-05-31 MED ORDER — MIDODRINE HCL 5 MG PO TABS: 10.0000 mg | ORAL_TABLET | Freq: Three times a day (TID) | ORAL | Status: DC | PRN

## 2023-05-31 MED ORDER — DM-GUAIFENESIN ER 30-600 MG PO TB12: 1.0000 | ORAL_TABLET | Freq: Two times a day (BID) | ORAL | Status: DC | PRN

## 2023-05-31 MED ORDER — FLUDROCORTISONE ACETATE 0.1 MG PO TABS
200.0000 ug | ORAL_TABLET | Freq: Two times a day (BID) | ORAL | Status: DC
  Administered 2023-05-31 – 2023-06-02 (×4): 200 ug via ORAL
  Filled 2023-05-31 (×4): qty 2

## 2023-05-31 MED ORDER — CLONAZEPAM 0.5 MG PO TABS: 0.5000 mg | ORAL_TABLET | Freq: Every day | ORAL | Status: DC | PRN

## 2023-05-31 MED ORDER — GABAPENTIN 300 MG PO CAPS
600.0000 mg | ORAL_CAPSULE | Freq: Every day | ORAL | Status: DC
  Administered 2023-05-31 – 2023-06-01 (×2): 600 mg via ORAL
  Filled 2023-05-31 (×2): qty 2

## 2023-05-31 MED ORDER — SODIUM CHLORIDE 0.9 % IV SOLN
125.0000 mg | Freq: Once | INTRAVENOUS | Status: AC
  Administered 2023-05-31: 125 mg via INTRAVENOUS
  Filled 2023-05-31: qty 10

## 2023-05-31 MED ORDER — PANTOPRAZOLE SODIUM 40 MG IV SOLR
40.0000 mg | Freq: Two times a day (BID) | INTRAVENOUS | Status: DC
  Administered 2023-05-31 (×2): 40 mg via INTRAVENOUS
  Filled 2023-05-31 (×2): qty 10

## 2023-05-31 MED ORDER — OXCARBAZEPINE 300 MG PO TABS
300.0000 mg | ORAL_TABLET | Freq: Two times a day (BID) | ORAL | Status: DC
  Administered 2023-05-31 – 2023-06-02 (×4): 300 mg via ORAL
  Filled 2023-05-31 (×5): qty 1

## 2023-05-31 MED ORDER — ALBUTEROL SULFATE (2.5 MG/3ML) 0.083% IN NEBU: 3.0000 mL | INHALATION_SOLUTION | RESPIRATORY_TRACT | Status: DC | PRN

## 2023-05-31 MED ORDER — GABAPENTIN 300 MG PO CAPS
300.0000 mg | ORAL_CAPSULE | Freq: Two times a day (BID) | ORAL | Status: DC
  Administered 2023-05-31 – 2023-06-02 (×4): 300 mg via ORAL
  Filled 2023-05-31 (×4): qty 1

## 2023-05-31 MED ORDER — SODIUM CHLORIDE 0.9 % IV SOLN: 10.0000 mL/h | Freq: Once | INTRAVENOUS | Status: DC

## 2023-05-31 MED ORDER — SENNA 8.6 MG PO TABS: 1.0000 | ORAL_TABLET | Freq: Every day | ORAL | Status: DC | PRN

## 2023-05-31 MED ORDER — FUROSEMIDE 10 MG/ML IJ SOLN
80.0000 mg | Freq: Once | INTRAMUSCULAR | Status: AC
  Administered 2023-05-31: 80 mg via INTRAVENOUS
  Filled 2023-05-31: qty 8

## 2023-05-31 MED ORDER — ATORVASTATIN CALCIUM 10 MG PO TABS
10.0000 mg | ORAL_TABLET | Freq: Every day | ORAL | Status: DC
  Administered 2023-05-31 – 2023-06-02 (×3): 10 mg via ORAL
  Filled 2023-05-31 (×3): qty 1

## 2023-05-31 MED ORDER — DIPHENHYDRAMINE HCL 50 MG/ML IJ SOLN: 12.5000 mg | Freq: Three times a day (TID) | INTRAMUSCULAR | Status: DC | PRN

## 2023-05-31 MED ORDER — OYSTER SHELL CALCIUM/D3 500-5 MG-MCG PO TABS
1.0000 | ORAL_TABLET | Freq: Every day | ORAL | Status: DC
  Administered 2023-06-01: 1 via ORAL
  Filled 2023-05-31: qty 1

## 2023-05-31 MED ORDER — HYDRALAZINE HCL 20 MG/ML IJ SOLN: 5.0000 mg | INTRAMUSCULAR | Status: DC | PRN

## 2023-05-31 MED ORDER — ONDANSETRON HCL 4 MG/2ML IJ SOLN: 4.0000 mg | Freq: Three times a day (TID) | INTRAMUSCULAR | Status: DC | PRN

## 2023-05-31 MED ORDER — FERROUS SULFATE 325 (65 FE) MG PO TABS
324.0000 mg | ORAL_TABLET | Freq: Every day | ORAL | Status: DC
  Administered 2023-06-01 – 2023-06-02 (×2): 324 mg via ORAL
  Filled 2023-05-31 (×2): qty 1

## 2023-05-31 NOTE — ED Provider Notes (Signed)
[facility] Provider Note    Event Date/Time   First MD Initiated Contact with Patient 05/31/23 [phone]     (approximate)   History   Chief Complaint Shortness of Breath   HPI  Tricia Edwards is a 89 y.o. female with past medical history of CKD, diastolic CHF, atrial fibrillation on Eliquis, and trigeminal neuralgia who presents to the ED complaining of shortness of breath.  Patient's daughter at bedside reports patient has had increasing difficulty breathing over the past 2 days associated with "wheezing."  Daughter reports minimal dry cough and patient has not had any fevers, nausea, vomiting, or diarrhea.  She has not complained of any chest pain, but daughter does report that patient had some swelling in her legs yesterday.  Daughter has also noted that patient has seemed to gain weight recently.  Daughter denies any recent sick contacts.     Physical Exam   Triage Vital Signs: ED Triage Vitals  Encounter Vitals Group     BP      Systolic BP Percentile      Diastolic BP Percentile      Pulse      Resp      Temp      Temp src      SpO2      Weight      Height      Head Circumference      Peak Flow      Pain Score      Pain Loc      Pain Education      Exclude from Growth Chart     Most recent vital signs: Vitals:   05/31/23 0843 05/31/23 0930  BP: (!) 186/102 (!) 167/67  Pulse:  64  Resp:  17  Temp: 97.9 F (36.6 C)   SpO2:  95%    Constitutional: Alert and oriented. Eyes: Conjunctivae are normal. Head: Atraumatic. Nose: No congestion/rhinnorhea. Mouth/Throat: Mucous membranes are moist.  Cardiovascular: Normal rate, regular rhythm. Grossly normal heart sounds.  2+ radial pulses bilaterally. Respiratory: Normal respiratory effort.  No retractions. Lungs CTAB. Gastrointestinal: Soft and nontender. No distention. Musculoskeletal: No lower extremity tenderness nor edema.  Neurologic:  Normal speech and language. No gross focal  neurologic deficits are appreciated.    ED Results / Procedures / Treatments   Labs (all labs ordered are listed, but only abnormal results are displayed) Labs Reviewed  CBC WITH DIFFERENTIAL/PLATELET - Abnormal; Notable for the following components:      Result Value   RBC 2.05 (*)    Hemoglobin 6.2 (*)    HCT 21.0 (*)    MCV 102.4 (*)    MCHC 29.5 (*)    All other components within normal limits  BASIC METABOLIC PANEL - Abnormal; Notable for the following components:   Potassium 3.2 (*)    Glucose, Bld 106 (*)    Creatinine, Ser 1.09 (*)    Calcium 8.1 (*)    GFR, Estimated 48 (*)    All other components within normal limits  BRAIN NATRIURETIC PEPTIDE - Abnormal; Notable for the following components:   B Natriuretic Peptide 653.3 (*)    All other components within normal limits  PROTIME-INR - Abnormal; Notable for the following components:   Prothrombin Time 16.2 (*)    INR 1.3 (*)    All other components within normal limits  MAGNESIUM  TYPE AND SCREEN  PREPARE RBC (CROSSMATCH)  ABO/RH  TROPONIN I (HIGH SENSITIVITY)  EKG  ED ECG REPORT I, [name], the attending physician, personally viewed and interpreted this ECG.   Date: 05/31/2023  EKG Time: 8:39  Rate: 70  Rhythm: normal sinus rhythm  Axis: Normal  Intervals: Prolonged QT  ST&T Change: None  RADIOLOGY Chest x-ray reviewed and interpreted by me with pulmonary edema consistent with CHF, no focal infiltrate or effusion noted.  PROCEDURES:  Critical Care performed: Yes, see critical care procedure note(s)  .Critical Care  Performed by: [name], MD Authorized by: [name], MD   Critical care provider statement:    Critical care time (minutes):  30   Critical care time was exclusive of:  Separately billable procedures and treating other patients and teaching time   Critical care was necessary to treat or prevent imminent or life-threatening deterioration of the following  conditions: Anemia.   Critical care was time spent personally by me on the following activities:  Development of treatment plan with patient or surrogate, discussions with consultants, evaluation of patient's response to treatment, examination of patient, ordering and review of laboratory studies, ordering and review of radiographic studies, ordering and performing treatments and interventions, pulse oximetry, re-evaluation of patient's condition and review of old charts   I assumed direction of critical care for this patient from another provider in my specialty: no     Care discussed with: admitting provider      MEDICATIONS ORDERED IN ED: Medications  0.9 %  sodium chloride infusion (0 mL/hr Intravenous Hold 05/31/23 0951)  pantoprazole (PROTONIX) injection 40 mg (has no administration in time range)  furosemide (LASIX) injection 80 mg (80 mg Intravenous Given 05/31/23 1006)     IMPRESSION / MDM / ASSESSMENT AND PLAN / ED COURSE  I reviewed the triage vital signs and the nursing notes.                              89 y.o. female with past medical history of CKD, diastolic CHF, atrial fibrillation on Eliquis, and trigeminal neuralgia who presents to the ED with increasing difficulty breathing and dry cough over the past 2 days.  Patient's presentation is most consistent with acute presentation with potential threat to life or bodily function.  Differential diagnosis includes, but is not limited to, CHF exacerbation, ACS, PE, pneumonia, pneumothorax, bronchospasm, bronchitis, viral illness, anemia, electrolyte abnormality.  Patient nontoxic-appearing and in no acute distress, vital signs remarkable for hypertension but otherwise reassuring.  Patient is not in any respiratory distress, lungs clear to auscultation bilaterally and she is maintaining oxygen saturations at 98% on room air.  EKG shows no arrhythmia or ischemia, does show prolonged QT and we will check electrolytes including  magnesium level.  Additional labs and chest x-ray pending at this time.  Labs remarkable for mild hypokalemia with stable renal function, patient does have significant anemia with hemoglobin of 6.5 down from 9 a few months ago.  Chest x-ray additionally shows pulmonary edema and BNP noted to be elevated, suspect symptoms due to a combination of anemia and CHF.  We will diurese with IV Lasix and transfuse 1 unit PRBCs.  Daughter does report that patient stool has been dark recently and we will give dose of IV Protonix.  Troponin within normal limits and I doubt ACS.  Case discussed with hospitalist for admission.      FINAL CLINICAL IMPRESSION(S) / ED DIAGNOSES   Final diagnoses:  Symptomatic anemia  Acute on chronic diastolic  congestive heart failure (HCC)     Rx / DC Orders   ED Discharge Orders     None        Note:  This document was prepared using Dragon voice recognition software and may include unintentional dictation errors.   [name], MD 05/31/23 1016

## 2023-05-31 NOTE — ED Triage Notes (Signed)
Pt presents via POV with daughter c/o SOB x1 week. Daughter reports pt has hx of COPD. Also reports weight gain over the past few weeks. Pt reports she is having trouble breathing and is "tired". A&O x4.

## 2023-05-31 NOTE — Consult Note (Signed)
Consultation  Referring Provider:  Hospitalist    Admit date: 05/31/2023 Consult date: 05/31/2023         Reason for Consultation: Anemia              HPI:   Tricia Edwards is a 89 y.o. lady with history of HFpEF, a. Fib on DOAC, orthostatic hypotension, chronic anemia, CKD III, and other comorbidities here for shortness of breath, lower extremity swelling, and fatigue. Found to have acute on chronic heart failure exacerbation along with anemia to 6.2. No overt GI bleeding. She is constipated and has dark stool due to iron. She has issues with anemia all of her live and has required periodic blood transfusions. Had episode in 2018 for which she underwent EGD/Colonoscopy in [place] with no significant findings. She takes a PPI daily and avoids NSAIDS. Her 89st birthday is coming up for which she plans on traveling to [place] to celebrate with her kids. No family history of any bleeding disorder or GI malignancies.   Past Medical History:  Diagnosis Date   COPD (chronic obstructive pulmonary disease) (HCC)    Hypertension    Paroxysmal A-fib (HCC)     Past Surgical History:  Procedure Laterality Date   APPENDECTOMY     CARDIOVERSION N/A 10/31/2022   Procedure: CARDIOVERSION;  Surgeon: Antonieta Iba, MD;  Location: [facility] ORS;  Service: Cardiovascular;  Laterality: N/A;   KNEE SURGERY      Family History  Problem Relation Age of Onset   Kidney cancer Sister    Intracerebral hemorrhage Brother     Social History   Tobacco Use   Smoking status: Former   Smokeless tobacco: Never  Vaping Use   Vaping status: Never Used  Substance Use Topics   Alcohol use: Not Currently   Drug use: Not Currently    Prior to Admission medications   Medication Sig Start Date End Date Taking? Authorizing Provider  amiodarone (PACERONE) 200 MG tablet Take 1 tablet (200 mg total) by mouth daily. 04/29/23  Yes Gollan, Tollie Pizza, MD  atorvastatin (LIPITOR) 10 MG tablet Take 10 mg by mouth daily.  12/11/21  Yes [provider]  Calcium Carb-Cholecalciferol (CALCIUM 600/VITAMIN D PO) Take 1 tablet by mouth in the morning and at bedtime.   Yes [provider]  ELIQUIS 5 MG TABS tablet Take 5 mg by mouth 2 (two) times daily. 12/18/21  Yes [provider]  ferrous sulfate 324 MG TBEC Take 324 mg by mouth 3 (three) times a week. Take 1 tablet every morning, and 1 tablet in the evening every Wednesday and Saturday   Yes [provider]  fludrocortisone (FLORINEF) 0.1 MG tablet Take 2 tablets (200 mcg total) by mouth 2 (two) times daily. 04/29/23  Yes Gollan, Tollie Pizza, MD  gabapentin (NEURONTIN) 300 MG capsule Take 300-600 mg by mouth 3 (three) times daily. 12/12/21  Yes [provider]  midodrine (PROAMATINE) 10 MG tablet Take 10 mg by mouth 3 (three) times daily. 04/12/22  Yes [provider]  omeprazole (PRILOSEC) 20 MG capsule Take 20 mg by mouth daily.   Yes [provider]  Oxcarbazepine (TRILEPTAL) 300 MG tablet Take 300 mg by mouth 2 (two) times daily. 04/03/23  Yes [provider]  polyethylene glycol powder (MIRALAX) 17 GM/SCOOP powder Take 17 g by mouth as needed for mild constipation.   Yes [provider]  potassium chloride SA (KLOR-CON M) 20 MEQ tablet Take 20 mEq by mouth daily.  Yes [provider]  senna (SENOKOT) 8.6 MG TABS tablet Take 1 tablet by mouth daily as needed for mild constipation.   Yes [provider]  clonazePAM (KLONOPIN) 0.5 MG tablet Take 1 tablet (0.5 mg total) by mouth daily as needed for anxiety. Patient not taking: Reported on 04/29/2023 01/16/23 01/16/24  Delton Prairie, MD  fluticasone-salmeterol (ADVAIR DISKUS) 250-50 MCG/ACT AEPB Inhale 1 puff into the lungs in the morning and at bedtime. Patient not taking: Reported on 04/29/2023 11/04/22   Almon Hercules, MD  levalbuterol [facility] HFA) 45 MCG/ACT inhaler Inhale 1 puff into the lungs every 6 (six) hours as needed for  wheezing or shortness of breath. Patient not taking: Reported on 04/29/2023 11/04/22 11/04/23  Almon Hercules, MD  phenytoin (DILANTIN) 100 MG ER capsule Take 100-200 mg by mouth 2 (two) times daily. Take 200 mg every other morning alternating with 100 mg, and 100 mg capsule daily in the evening Patient not taking: Reported on 04/29/2023 11/22/21   [provider]    Current Facility-Administered Medications  Medication Dose Route Frequency Provider Last Rate Last Admin   0.9 %  sodium chloride infusion  10 mL/hr Intravenous Once [name], MD   Held at 05/31/23 [phone]   acetaminophen (TYLENOL) tablet 650 mg  650 mg Oral Q6H PRN Lorretta Harp, MD       albuterol (PROVENTIL) (2.5 MG/3ML) 0.083% nebulizer solution 3 mL  3 mL Inhalation Q4H PRN Lorretta Harp, MD       [START ON 06/01/2023] amiodarone (PACERONE) tablet 200 mg  200 mg Oral Daily Lorretta Harp, MD       atorvastatin (LIPITOR) tablet 10 mg  10 mg Oral Daily Lorretta Harp, MD   10 mg at 05/31/23 1136   [START ON 06/01/2023] calcium-vitamin D (OSCAL WITH D) 500-5 MG-MCG per tablet 1 tablet  1 tablet Oral Q lunch Lorretta Harp, MD       clonazePAM (KLONOPIN) tablet 0.5 mg  0.5 mg Oral Daily PRN Lorretta Harp, MD       dextromethorphan-guaiFENesin (MUCINEX DM) 30-600 MG per 12 hr tablet 1 tablet  1 tablet Oral BID PRN Lorretta Harp, MD       diphenhydrAMINE (BENADRYL) injection 12.5 mg  12.5 mg Intravenous Q8H PRN Lorretta Harp, MD       ferric gluconate (FERRLECIT) 125 mg in sodium chloride 0.9 % 100 mL IVPB  125 mg Intravenous Once Lorretta Harp, MD       Melene Muller ON 06/01/2023] ferrous sulfate tablet 324 mg  324 mg Oral Daily Lorretta Harp, MD       fludrocortisone (FLORINEF) tablet 200 mcg  200 mcg Oral BID Lorretta Harp, MD       furosemide (LASIX) injection 40 mg  40 mg Intravenous BID Lorretta Harp, MD       gabapentin (NEURONTIN) capsule 300 mg  300 mg Oral BID Lorretta Harp, MD       gabapentin (NEURONTIN) capsule 600 mg  600 mg Oral QHS Dolan, Carissa E, RPH        hydrALAZINE (APRESOLINE) injection 5 mg  5 mg Intravenous Q2H PRN Lorretta Harp, MD       midodrine (PROAMATINE) tablet 10 mg  10 mg Oral TID PRN Lorretta Harp, MD       Oxcarbazepine (TRILEPTAL) tablet 300 mg  300 mg Oral BID Lorretta Harp, MD       pantoprazole (PROTONIX) injection 40 mg  40 mg Intravenous Q12H Lorretta Harp, MD  40 mg at 05/31/23 1137   potassium chloride SA (KLOR-CON M) CR tablet 40 mEq  40 mEq Oral Q4H Lorretta Harp, MD   40 mEq at 05/31/23 1133   senna (SENOKOT) tablet 8.6 mg  1 tablet Oral Daily PRN Lorretta Harp, MD       Current Outpatient Medications  Medication Sig Dispense Refill   amiodarone (PACERONE) 200 MG tablet Take 1 tablet (200 mg total) by mouth daily. 90 tablet 3   atorvastatin (LIPITOR) 10 MG tablet Take 10 mg by mouth daily.     Calcium Carb-Cholecalciferol (CALCIUM 600/VITAMIN D PO) Take 1 tablet by mouth in the morning and at bedtime.     ELIQUIS 5 MG TABS tablet Take 5 mg by mouth 2 (two) times daily.     ferrous sulfate 324 MG TBEC Take 324 mg by mouth 3 (three) times a week. Take 1 tablet every morning, and 1 tablet in the evening every Wednesday and Saturday     fludrocortisone (FLORINEF) 0.1 MG tablet Take 2 tablets (200 mcg total) by mouth 2 (two) times daily. 120 tablet 6   gabapentin (NEURONTIN) 300 MG capsule Take 300-600 mg by mouth 3 (three) times daily.     midodrine (PROAMATINE) 10 MG tablet Take 10 mg by mouth 3 (three) times daily.     omeprazole (PRILOSEC) 20 MG capsule Take 20 mg by mouth daily.     Oxcarbazepine (TRILEPTAL) 300 MG tablet Take 300 mg by mouth 2 (two) times daily.     polyethylene glycol powder (MIRALAX) 17 GM/SCOOP powder Take 17 g by mouth as needed for mild constipation.     potassium chloride SA (KLOR-CON M) 20 MEQ tablet Take 20 mEq by mouth daily.     senna (SENOKOT) 8.6 MG TABS tablet Take 1 tablet by mouth daily as needed for mild constipation.     clonazePAM (KLONOPIN) 0.5 MG tablet Take 1 tablet (0.5 mg total) by mouth  daily as needed for anxiety. (Patient not taking: Reported on 04/29/2023) 5 tablet 0   fluticasone-salmeterol (ADVAIR DISKUS) 250-50 MCG/ACT AEPB Inhale 1 puff into the lungs in the morning and at bedtime. (Patient not taking: Reported on 04/29/2023) 1 each 1   levalbuterol (XOPENEX HFA) 45 MCG/ACT inhaler Inhale 1 puff into the lungs every 6 (six) hours as needed for wheezing or shortness of breath. (Patient not taking: Reported on 04/29/2023) 1 each 2   phenytoin (DILANTIN) 100 MG ER capsule Take 100-200 mg by mouth 2 (two) times daily. Take 200 mg every other morning alternating with 100 mg, and 100 mg capsule daily in the evening (Patient not taking: Reported on 04/29/2023)      Allergies as of 05/31/2023 - Review Complete 05/31/2023  Allergen Reaction Noted   Pneumovax [pneumococcal polysaccharide vaccine]  08/03/2003   Pneumococcal vaccine  06/20/2013     Review of Systems:    All systems reviewed and negative except where noted in HPI.  Review of Systems  Constitutional:  Negative for chills and fever.  Respiratory:  Positive for shortness of breath.   Cardiovascular:  Positive for leg swelling. Negative for chest pain.  Gastrointestinal:  Positive for constipation. Negative for abdominal pain, blood in stool and melena.  Musculoskeletal:  Negative for joint pain.  Skin:  Negative for rash.  Neurological:  Negative for focal weakness.  Psychiatric/Behavioral:  Negative for substance abuse.   All other systems reviewed and are negative.      Physical Exam:  Vital signs in last  24 hours: Temp:  [97.8 F (36.6 C)-98.2 F (36.8 C)] 98 F (36.7 C) (07/13 1359) Pulse Rate:  [57-68] 61 (07/13 1359) Resp:  [16-18] 18 (07/13 1359) BP: (118-186)/(62-106) 172/100 (07/13 1359) SpO2:  [93 %-100 %] 94 % (07/13 1359)   General:   Pleasant in NAD Head:  Normocephalic and atraumatic. Ears:  Normal auditory acuity. Mouth: Mucosa pink moist, no lesions. Neck:  Supple; no masses  felt Lungs:  No respiratory distress Abdomen:   Flat, soft, nondistended, nontender Msk:  1+ edema bilaterally Neurologic:  Alert and  oriented x4;  No focal deficits Skin:  Warm, dry, pink without significant lesions or rashes. Psych:  Alert and cooperative. Normal affect.  LAB RESULTS: Recent Labs    05/31/23 0844  WBC 5.6  HGB 6.2*  HCT 21.0*  PLT 269   BMET Recent Labs    05/31/23 0844  NA 142  K 3.2*  CL 104  CO2 29  GLUCOSE 106*  BUN 20  CREATININE 1.09*  CALCIUM 8.1*   LFT No results for input(s): "PROT", "ALBUMIN", "AST", "ALT", "ALKPHOS", "BILITOT", "BILIDIR", "IBILI" in the last 72 hours. PT/INR Recent Labs    05/31/23 0844  LABPROT 16.2*  INR 1.3*    STUDIES: DG Chest 2 View  Result Date: 05/31/2023 CLINICAL DATA:  Shortness of breath EXAM: CHEST - 2 VIEW COMPARISON:  11/02/2022 FINDINGS: Stable cardiomegaly. Aortic atherosclerosis. Bilateral interstitial opacities most pronounced within the perihilar and bibasilar regions. Trace bilateral pleural effusions. No pneumothorax. IMPRESSION: Appearance suggests CHF with mild pulmonary edema. Electronically Signed   By: Duanne Guess D.O.   On: 05/31/2023 09:44       Impression / Plan:   89 y/o lady with history of HFpEF, a. Fib on DOAC, orthostatic hypotension, chronic anemia, CKD III, and other comorbidities here with acute on chronic heart failure exacerbation and IDA. Given heart failure exacerbation and no overt GI bleeding, will not plan on any procedures as her Daughter would also like to avoid any invasive procedures.  - treatment of heart failure exacerbation per primary team - maintain active type and scree and transfuse for hemoglobin < 7 - consider checking b12 as well - no plans for luminal evaluation so can start a diet - in terms of restarting DOAC, it's a risk/benefit discussion with patient but I would be ok with re-starting at discharge - continue PPI PO  Will continue to follow,  please call with any questions or concerns.  Merlyn Lot MD, MPH [facility]

## 2023-05-31 NOTE — H&P (Addendum)
History and Physical    Tricia Edwards ZHY:[id number] DOB: [birth year] DOA: 05/31/2023  Referring MD/NP/PA:   PCP: [facility]   Patient coming from:  The patient is coming from home.     Chief Complaint: SOB and dark stool  HPI: Tricia Edwards is a 89 y.o. female with medical history significant of A fib on Eliquis, dCHF, HTN, HLD, COPD, CKD-3a, anemia, chronic orthostatic hypotension, trigeminal neuralgia, who presents with SOB and dark stool.  Patient states that she has shortness of breath for almost a week, associated with dry cough, no chest pain fever or chills.  She also reports some weight gain recently.  She has generalized weakness, no unilateral numbness or tingling in extremities.  No facial droop or slurred speech.  She has dark stool chronically, no nausea, vomiting or abdominal pain.  No symptoms of UTI.  Data reviewed independently and ED Course: pt was found to have Hgb 6.2 ( 11.6 on 01/21/23), WBC 5.6, BNP 653, stable renal function, temperature normal, blood pressure 186/102, 167/67, heart rate 68, RR 17, oxygen saturation 95% on room air.  Chest x-ray showed mild pulmonary edema.  Patient is admitted to telemetry bed as inpatient.  Dr. Mia Creek for GI is consulted.  EKG: I have personally reviewed.  Sinus rhythm, QTc 518, borderline left axis deviation, nonspecific T wave change    Review of Systems:   General: no fevers, chills, no body weight gain, has fatigue HEENT: no blurry vision, hearing changes or sore throat Respiratory: has dyspnea, coughing, no wheezing CV: no chest pain, no palpitations GI: no nausea, vomiting, abdominal pain, diarrhea, constipation. Has dark stool GU: no dysuria, burning on urination, increased urinary frequency, hematuria  Ext: has leg edema Neuro: no unilateral weakness, numbness, or tingling, no vision change or hearing loss Skin: no rash, no skin tear. MSK: No muscle spasm, no deformity, no limitation of range of  movement in spin Heme: No easy bruising.  Travel history: No recent long distant travel.   Allergy:  Allergies  Allergen Reactions   Pneumovax [Pneumococcal Polysaccharide Vaccine]     Other reaction(s): Severe arm swelling   Pneumococcal Vaccine     Other reaction(s): Unknown    Past Medical History:  Diagnosis Date   COPD (chronic obstructive pulmonary disease) (HCC)    Hypertension    Paroxysmal A-fib (HCC)     Past Surgical History:  Procedure Laterality Date   APPENDECTOMY     CARDIOVERSION N/A 10/31/2022   Procedure: CARDIOVERSION;  Surgeon: Antonieta Iba, MD;  Location: [facility] ORS;  Service: Cardiovascular;  Laterality: N/A;   KNEE SURGERY      Social History:  reports that she has quit smoking. She has never used smokeless tobacco. She reports that she does not currently use alcohol. She reports that she does not currently use drugs.  Family History:  Family History  Problem Relation Age of Onset   Kidney cancer Sister    Intracerebral hemorrhage Brother      Prior to Admission medications   Medication Sig Start Date End Date Taking? Authorizing Provider  amiodarone (PACERONE) 200 MG tablet Take 1 tablet (200 mg total) by mouth daily. 04/29/23   Antonieta Iba, MD  atorvastatin (LIPITOR) 10 MG tablet Take 10 mg by mouth daily. 12/11/21   [provider]  Calcium Carb-Cholecalciferol (CALCIUM 600/VITAMIN D PO) Take 1 tablet by mouth in the morning and at bedtime.    [provider]  clonazePAM (KLONOPIN) 0.5 MG tablet  Take 1 tablet (0.5 mg total) by mouth daily as needed for anxiety. Patient not taking: Reported on 04/29/2023 01/16/23 01/16/24  Delton Prairie, MD  ELIQUIS 5 MG TABS tablet Take 5 mg by mouth 2 (two) times daily. 12/18/21   [provider]  ferrous sulfate 324 MG TBEC Take 324 mg by mouth 3 (three) times a week. Take 1 tablet every morning, and 1 tablet in the evening every Wednesday and Saturday    [provider]   fludrocortisone (FLORINEF) 0.1 MG tablet Take 2 tablets (200 mcg total) by mouth 2 (two) times daily. 04/29/23   Antonieta Iba, MD  fluticasone-salmeterol (ADVAIR DISKUS) 250-50 MCG/ACT AEPB Inhale 1 puff into the lungs in the morning and at bedtime. Patient not taking: Reported on 04/29/2023 11/04/22   Almon Hercules, MD  gabapentin (NEURONTIN) 300 MG capsule Take 300 mg by mouth 2 (two) times daily. Patient not taking: Reported on 04/29/2023 12/12/21   [provider]  levalbuterol [facility] HFA) 45 MCG/ACT inhaler Inhale 1 puff into the lungs every 6 (six) hours as needed for wheezing or shortness of breath. Patient not taking: Reported on 04/29/2023 11/04/22 11/04/23  Almon Hercules, MD  midodrine (PROAMATINE) 10 MG tablet Take 10 mg by mouth 3 (three) times daily. 04/12/22   [provider]  omeprazole (PRILOSEC) 20 MG capsule Take 20 mg by mouth daily.    [provider]  Oxcarbazepine (TRILEPTAL) 300 MG tablet Take 300 mg by mouth 2 (two) times daily. 04/03/23   [provider]  phenytoin (DILANTIN) 100 MG ER capsule Take 100-200 mg by mouth 2 (two) times daily. Take 200 mg every other morning alternating with 100 mg, and 100 mg capsule daily in the evening Patient not taking: Reported on 04/29/2023 11/22/21   [provider]  polyethylene glycol powder (MIRALAX) 17 GM/SCOOP powder as directed Orally    [provider]  potassium chloride SA (KLOR-CON M) 20 MEQ tablet Take 20 mEq by mouth daily.    [provider]  senna (SENOKOT) 8.6 MG TABS tablet Take 1 tablet by mouth daily as needed for mild constipation.    [provider]    Physical Exam: Vitals:   05/31/23 1130 05/31/23 1200 05/31/23 1300 05/31/23 1359  BP: (!) 176/62 (!) 171/87 (!) 150/66 (!) 172/100  Pulse: (!) 57 (!) 58 (!) 57 61  Resp: 17 17 18 18   Temp:    98 F (36.7 C)  TempSrc:    Oral  SpO2: 100% 97% 94% 94%   General: Not in acute distress. Pale  looking HEENT:       Eyes: PERRL, EOMI, no jaundice       ENT: No discharge from the ears and nose, no pharynx injection, no tonsillar enlargement.        Neck: positive JVD, no bruit, no mass felt. Heme: No neck lymph node enlargement. Cardiac: S1/S2, RRR, No murmurs, No gallops or rubs. Respiratory: No rales, wheezing, rhonchi or rubs. GI: Soft, nondistended, nontender, no rebound pain, no organomegaly, BS present. GU: No hematuria Ext: 1+ pitting leg edema bilaterally. 1+DP/PT pulse bilaterally. Musculoskeletal: No joint deformities, No joint redness or warmth, no limitation of ROM in spin. Skin: No rashes.  Neuro: Alert, oriented X3, cranial nerves II-XII grossly intact, moves all extremities normally. Psych: Patient is not psychotic, no suicidal or hemocidal ideation.  Labs on Admission: I have personally reviewed following labs and imaging studies  CBC: Recent Labs  Lab 05/31/23  0844  WBC 5.6  NEUTROABS 3.7  HGB 6.2*  HCT 21.0*  MCV 102.4*  PLT 269   Basic Metabolic Panel: Recent Labs  Lab 05/31/23 0844  NA 142  K 3.2*  CL 104  CO2 29  GLUCOSE 106*  BUN 20  CREATININE 1.09*  CALCIUM 8.1*  MG 2.1   GFR: CrCl cannot be calculated (Unknown ideal weight.). Liver Function Tests: No results for input(s): "AST", "ALT", "ALKPHOS", "BILITOT", "PROT", "ALBUMIN" in the last 168 hours. No results for input(s): "LIPASE", "AMYLASE" in the last 168 hours. No results for input(s): "AMMONIA" in the last 168 hours. Coagulation Profile: Recent Labs  Lab 05/31/23 0844  INR 1.3*   Cardiac Enzymes: No results for input(s): "CKTOTAL", "CKMB", "CKMBINDEX", "TROPONINI" in the last 168 hours. BNP (last 3 results) No results for input(s): "PROBNP" in the last 8760 hours. HbA1C: No results for input(s): "HGBA1C" in the last 72 hours. CBG: No results for input(s): "GLUCAP" in the last 168 hours. Lipid Profile: No results for input(s): "CHOL", "HDL", "LDLCALC", "TRIG",  "CHOLHDL", "LDLDIRECT" in the last 72 hours. Thyroid Function Tests: No results for input(s): "TSH", "T4TOTAL", "FREET4", "T3FREE", "THYROIDAB" in the last 72 hours. Anemia Panel: Recent Labs    05/31/23 0856  FOLATE 10.4  FERRITIN 9*  TIBC 350  IRON 32   Urine analysis:    Component Value Date/Time   COLORURINE STRAW (A) 05/22/2022 1700   APPEARANCEUR CLEAR (A) 05/22/2022 1700   LABSPEC 1.005 05/22/2022 1700   PHURINE 7.0 05/22/2022 1700   GLUCOSEU NEGATIVE 05/22/2022 1700   HGBUR NEGATIVE 05/22/2022 1700   BILIRUBINUR NEGATIVE 05/22/2022 1700   KETONESUR NEGATIVE 05/22/2022 1700   PROTEINUR NEGATIVE 05/22/2022 1700   NITRITE NEGATIVE 05/22/2022 1700   LEUKOCYTESUR NEGATIVE 05/22/2022 1700   Sepsis Labs: @LABRCNTIP (procalcitonin:4,lacticidven:4) )No results found for this or any previous visit (from the past 240 hour(s)).   Radiological Exams on Admission: DG Chest 2 View  Result Date: 05/31/2023 CLINICAL DATA:  Shortness of breath EXAM: CHEST - 2 VIEW COMPARISON:  11/02/2022 FINDINGS: Stable cardiomegaly. Aortic atherosclerosis. Bilateral interstitial opacities most pronounced within the perihilar and bibasilar regions. Trace bilateral pleural effusions. No pneumothorax. IMPRESSION: Appearance suggests CHF with mild pulmonary edema. Electronically Signed   By: Duanne Guess D.O.   On: 05/31/2023 09:44      Assessment/Plan Principal Problem:   Acute on chronic diastolic CHF (congestive heart failure) (HCC) Active Problems:   Iron deficiency anemia   Symptomatic anemia   Chronic orthostatic hypotension   Atrial fibrillation, chronic (HCC)   Chronic kidney disease, stage 3a (HCC)   Hyperlipidemia   Trigeminal neuralgia of left side of face   HTN (hypertension)   Hypokalemia   Assessment and Plan:  Acute on chronic diastolic CHF (congestive heart failure) (HCC): 2D echo on 01/18/2021 showed EF 60 to 65%.  Patient has shortness of breath, elevated BNP 653,  pulmonary edema on chest x-ray, positive JVD, 1+ leg edema, clinically consistent with CHF exacerbation.  No new oxygen requirement.  -Will admit to tele bed as inpatient -Lasix 40 mg bid by IV (patient received 80 mg of Lasix in ED) -2d echo -Daily weights -strict I/O's -Low salt diet -Fluid restriction -As needed bronchodilators for shortness of breath  Symptomatic anemia due to possible chronic GI blood loss: Hgb 11.6 on 01/21/23 --> 6.2.has dark stool - transfuse 1 unit  of blood now - Start IV pantoprazole 40 mg bid - Check anemia panel - Zofran IV for nausea -  Avoid NSAIDs and SQ heparin - Maintain IV access (2 large bore IVs if possible). - Monitor closely and follow q6h cbc, transfuse as necessary, if Hgb<7.0 - LaB: INR, PTT and type screen - consulted Dr. Corlis Hove of GI -Hold Elquis  Iron deficiency anemia: Patient is on iron supplement, but anemia panel showed ferritin 9, iron 32, -Continue iron supplement orally -Give 1 dose of IV Ferriecit 125 mg  Chronic orthostatic hypotension -Fall precaution -Continue home Florinef, midodrine  Atrial fibrillation, chronic (HCC): Heart rate 68 -Hold Eliquis -Continue amiodarone  Chronic kidney disease, stage 3a (HCC): Stable, creatinine 1.09, BUN 20, GFR 48 (baseline creatinine 1.1 on 03/25/2022) -Follow-up with BMP  Hyperlipidemia -Lipitor  Trigeminal neuralgia of left side of face -Continue Trileptal  HTN (hypertension): -IV hydralazine as needed  Hypokalemia: Potassium 3.2, magnesium 2.1 -Repleted potassium    DVT ppx: SCD  Code Status: DNR per pt and her daughter  Family Communication:   Yes, patient's daughter by phone  Disposition Plan:  Anticipate discharge back to previous environment  Consults called: Dr. Mia Creek for GI  Admission status and Level of care: Telemetry Cardiac:    as inpt      Dispo: The patient is from: Home              Anticipated d/c is to: Home              Anticipated d/c date  is: 2 days              Patient currently is not medically stable to d/c.    Severity of Illness:  The appropriate patient status for this patient is INPATIENT. Inpatient status is judged to be reasonable and necessary in order to provide the required intensity of service to ensure the patient's safety. The patient's presenting symptoms, physical exam findings, and initial radiographic and laboratory data in the context of their chronic comorbidities is felt to place them at high risk for further clinical deterioration. Furthermore, it is not anticipated that the patient will be medically stable for discharge from the hospital within 2 midnights of admission.   * I certify that at the point of admission it is my clinical judgment that the patient will require inpatient hospital care spanning beyond 2 midnights from the point of admission due to high intensity of service, high risk for further deterioration and high frequency of surveillance required.*       Date of Service 05/31/2023    Lorretta Harp [facility]   If 7PM-7AM, please contact night-coverage [email or web address] 05/31/2023, 2:41 PM

## 2023-06-01 ENCOUNTER — Other Ambulatory Visit: Payer: Self-pay

## 2023-06-01 ENCOUNTER — Inpatient Hospital Stay (HOSPITAL_COMMUNITY)
Admit: 2023-06-01 | Discharge: 2023-06-01 | Disposition: A | Discharge status: Home / Self Care | Payer: Medicare Other | Attending: Internal Medicine | Admitting: Internal Medicine

## 2023-06-01 DIAGNOSIS — I5033 Acute on chronic diastolic (congestive) heart failure: Secondary | ICD-10-CM | POA: Diagnosis not present

## 2023-06-01 DIAGNOSIS — I5031 Acute diastolic (congestive) heart failure: Secondary | ICD-10-CM | POA: Diagnosis not present

## 2023-06-01 LAB — BASIC METABOLIC PANEL
Anion gap: 8 (ref 5–15)
BUN: 18 mg/dL (ref 8–23)
CO2: 34 mmol/L — ABNORMAL HIGH (ref 22–32)
Calcium: 7.5 mg/dL — ABNORMAL LOW (ref 8.9–10.3)
Chloride: 103 mmol/L (ref 98–111)
Creatinine, Ser: 1.16 mg/dL — ABNORMAL HIGH (ref 0.44–1.00)
GFR, Estimated: 45 mL/min — ABNORMAL LOW (ref >60)
Glucose, Bld: 91 mg/dL (ref 70–99)
Potassium: 3.2 mmol/L — ABNORMAL LOW (ref 3.5–5.1)
Sodium: 145 mmol/L (ref 135–145)

## 2023-06-01 LAB — CBC
HCT: 23.7 % — ABNORMAL LOW (ref 36.0–46.0)
Hemoglobin: 7.2 g/dL — ABNORMAL LOW (ref 12.0–15.0)
MCH: 30.1 pg (ref 26.0–34.0)
MCHC: 30.4 g/dL (ref 30.0–36.0)
MCV: 99.2 fL (ref 80.0–100.0)
Platelets: 246 10*3/uL (ref 150–400)
RBC: 2.39 MIL/uL — ABNORMAL LOW (ref 3.87–5.11)
RDW: 14.9 % (ref 11.5–15.5)
WBC: 4.3 10*3/uL (ref 4.0–10.5)
nRBC: 0 % (ref 0.0–0.2)

## 2023-06-01 LAB — MAGNESIUM: Magnesium: 1.8 mg/dL (ref 1.7–2.4)

## 2023-06-01 LAB — CBG MONITORING, ED: Glucose-Capillary: 82 mg/dL (ref 70–99)

## 2023-06-01 LAB — VITAMIN B12: Vitamin B-12: 527 pg/mL (ref 180–914)

## 2023-06-01 LAB — PREPARE RBC (CROSSMATCH)

## 2023-06-01 MED ORDER — POTASSIUM CHLORIDE CRYS ER 20 MEQ PO TBCR
40.0000 meq | EXTENDED_RELEASE_TABLET | Freq: Once | ORAL | Status: AC
  Administered 2023-06-01: 40 meq via ORAL
  Filled 2023-06-01: qty 2

## 2023-06-01 MED ORDER — SODIUM CHLORIDE 0.9% IV SOLUTION: Freq: Once | INTRAVENOUS | Status: AC

## 2023-06-01 MED ORDER — MAGNESIUM SULFATE 2 GM/50ML IV SOLN
2.0000 g | Freq: Once | INTRAVENOUS | Status: AC
  Administered 2023-06-01: 2 g via INTRAVENOUS
  Filled 2023-06-01: qty 50

## 2023-06-01 MED ORDER — PANTOPRAZOLE SODIUM 40 MG PO TBEC
40.0000 mg | DELAYED_RELEASE_TABLET | Freq: Two times a day (BID) | ORAL | Status: DC
  Administered 2023-06-01 – 2023-06-02 (×3): 40 mg via ORAL
  Filled 2023-06-01 (×3): qty 1

## 2023-06-01 NOTE — Progress Notes (Signed)
  Echocardiogram 2D Echocardiogram has been performed.  Tricia Edwards 06/01/2023, 4:06 PM

## 2023-06-01 NOTE — ED Notes (Signed)
Advised nurse that patient has ready bed 

## 2023-06-01 NOTE — Plan of Care (Signed)
  Problem: Education: Goal: Ability to demonstrate management of disease process will improve Outcome: Progressing Goal: Ability to verbalize understanding of medication therapies will improve Outcome: Progressing Goal: Individualized Educational Video(s) Outcome: Progressing   Problem: Education: Goal: Ability to demonstrate management of disease process will improve Outcome: Progressing Goal: Ability to verbalize understanding of medication therapies will improve Outcome: Progressing Goal: Individualized Educational Video(s) Outcome: Progressing   

## 2023-06-01 NOTE — Progress Notes (Signed)
PROGRESS NOTE    Tricia Edwards  NFA:[id number] DOB: [birth year] DOA: 05/31/2023 PCP: [facility]    Brief Narrative:  89 y.o. female with medical history significant of A fib on Eliquis, dCHF, HTN, HLD, COPD, CKD-3a, anemia, chronic orthostatic hypotension, trigeminal neuralgia, who presents with SOB and dark stool.   Patient states that she has shortness of breath for almost a week, associated with dry cough, no chest pain fever or chills.  She also reports some weight gain recently.  She has generalized weakness, no unilateral numbness or tingling in extremities.  No facial droop or slurred speech.  She has dark stool chronically, no nausea, vomiting or abdominal pain.  No symptoms of UTI.  Assessment & Plan:   Principal Problem:   Acute on chronic diastolic CHF (congestive heart failure) (HCC) Active Problems:   Iron deficiency anemia   Symptomatic anemia   Chronic orthostatic hypotension   Atrial fibrillation, chronic (HCC)   Chronic kidney disease, stage 3a (HCC)   Hyperlipidemia   Trigeminal neuralgia of left side of face   HTN (hypertension)   Hypokalemia  Acute on chronic diastolic CHF (congestive heart failure) (HCC)  2D echo on 01/18/2021 showed EF 60 to 65%.  Patient has shortness of breath, elevated BNP 653, pulmonary edema on chest x-ray, positive JVD, 1+ leg edema, clinically consistent with CHF exacerbation.  No new oxygen requirement. Plan: Continue Lasix 40 mg IV twice daily today Check 2D echocardiogram Daily weights, strict ins and outs  Symptomatic anemia due to possible chronic GI blood loss Chronic anticoagulation : Hgb 11.6 on 01/21/23 --> 6.2.has dark stool Received 1 unit PRBC Hemoglobin 7.2 posttransfusion Received IV iron Plan: No plans for endoscopic evaluation P.o. PPI twice daily Transfuse additional unit PRBC for target hemoglobin 8 Hold Eliquis  Iron deficiency anemia Patient is on iron supplement, but anemia panel showed  ferritin 9, iron 32, -Continue iron supplement orally -Status post 1 dose IV iron   Chronic orthostatic hypotension -Fall precaution -Continue home Florinef, midodrine   Atrial fibrillation, chronic (HCC): Heart rate 68 -Hold Eliquis -Continue amiodarone   Chronic kidney disease, stage 3a (HCC)  Stable, creatinine 1.09, BUN 20, GFR 48 (baseline creatinine 1.1 on 03/25/2022)    Hyperlipidemia -Lipitor   Trigeminal neuralgia of left side of face -Continue Trileptal   HTN (hypertension): -IV hydralazine as needed   Hypokalemia:  Monitor potassium and replace as necessary.   Goal potassium 4   DVT prophylaxis: SCDs Code Status: DNR Family Communication: Daughter at bedside 7/14 Disposition Plan: Status is: Inpatient Remains inpatient appropriate because: Decompensated heart failure.  Acute on chronic anemia   Level of care: Telemetry Cardiac  Consultants:  GI  Procedures:  None  Antimicrobials: None   Subjective: Seen examined.  Resting in bed.  No visible distress  Objective: Vitals:   06/01/23 1000 06/01/23 1030 06/01/23 1050 06/01/23 1100  BP: (!) 148/62 (!) 96/50  (!) 116/51  Pulse: (!) 59 61  64  Resp: 20 (!) 23  15  Temp:   98.2 F (36.8 C)   TempSrc:   Oral   SpO2: 100% 100%  100%    Intake/Output Summary (Last 24 hours) at 06/01/2023 1126 Last data filed at 06/01/2023 0915 Gross per 24 hour  Intake 50 ml  Output 3825 ml  Net -3775 ml   There were no vitals filed for this visit.  Examination:  General exam: Appears calm and comfortable  Respiratory system: Scattered crackles bilaterally.  Normal work  of breathing.  2 L Cardiovascular system: S1-S2, RRR, no murmurs, pedal edema Gastrointestinal system: Abdomen is nondistended, soft and nontender. No organomegaly or masses felt. Normal bowel sounds heard. Central nervous system: Alert and oriented. No focal neurological deficits. Extremities: Symmetric 5 x 5 power. Skin: No rashes, lesions  or ulcers Psychiatry: Judgement and insight appear normal. Mood & affect appropriate.     Data Reviewed: I have personally reviewed following labs and imaging studies  CBC: Recent Labs  Lab 05/31/23 0844 05/31/23 1617 05/31/23 2218 06/01/23 0458  WBC 5.6 5.2 5.0 4.3  NEUTROABS 3.7  --   --   --   HGB 6.2* 7.7* 7.5* 7.2*  HCT 21.0* 25.4* 24.8* 23.7*  MCV 102.4* 98.4 98.8 99.2  PLT 269 265 262 246   Basic Metabolic Panel: Recent Labs  Lab 05/31/23 0844 06/01/23 0458  NA 142 145  K 3.2* 3.2*  CL 104 103  CO2 29 34*  GLUCOSE 106* 91  BUN 20 18  CREATININE 1.09* 1.16*  CALCIUM 8.1* 7.5*  MG 2.1 1.8   GFR: CrCl cannot be calculated (Unknown ideal weight.). Liver Function Tests: No results for input(s): "AST", "ALT", "ALKPHOS", "BILITOT", "PROT", "ALBUMIN" in the last 168 hours. No results for input(s): "LIPASE", "AMYLASE" in the last 168 hours. No results for input(s): "AMMONIA" in the last 168 hours. Coagulation Profile: Recent Labs  Lab 05/31/23 0844  INR 1.3*   Cardiac Enzymes: No results for input(s): "CKTOTAL", "CKMB", "CKMBINDEX", "TROPONINI" in the last 168 hours. BNP (last 3 results) No results for input(s): "PROBNP" in the last 8760 hours. HbA1C: No results for input(s): "HGBA1C" in the last 72 hours. CBG: Recent Labs  Lab 06/01/23 0806  GLUCAP 82   Lipid Profile: No results for input(s): "CHOL", "HDL", "LDLCALC", "TRIG", "CHOLHDL", "LDLDIRECT" in the last 72 hours. Thyroid Function Tests: No results for input(s): "TSH", "T4TOTAL", "FREET4", "T3FREE", "THYROIDAB" in the last 72 hours. Anemia Panel: Recent Labs    05/31/23 0856 05/31/23 1617 05/31/23 2218  VITAMINB12  --   --  527  FOLATE 10.4  --   --   FERRITIN 9*  --   --   TIBC 350  --   --   IRON 32  --   --   RETICCTPCT  --  6.5*  --    Sepsis Labs: No results for input(s): "PROCALCITON", "LATICACIDVEN" in the last 168 hours.  No results found for this or any previous visit (from  the past 240 hour(s)).       Radiology Studies: DG Chest 2 View  Result Date: 05/31/2023 CLINICAL DATA:  Shortness of breath EXAM: CHEST - 2 VIEW COMPARISON:  11/02/2022 FINDINGS: Stable cardiomegaly. Aortic atherosclerosis. Bilateral interstitial opacities most pronounced within the perihilar and bibasilar regions. Trace bilateral pleural effusions. No pneumothorax. IMPRESSION: Appearance suggests CHF with mild pulmonary edema. Electronically Signed   By: Duanne Guess D.O.   On: 05/31/2023 09:44        Scheduled Meds:  sodium chloride   Intravenous Once   amiodarone  200 mg Oral Daily   atorvastatin  10 mg Oral Daily   calcium-vitamin D  1 tablet Oral Q lunch   ferrous sulfate  324 mg Oral Daily   fludrocortisone  200 mcg Oral BID   furosemide  40 mg Intravenous BID   gabapentin  300 mg Oral BID   gabapentin  600 mg Oral QHS   Oxcarbazepine  300 mg Oral BID   pantoprazole (PROTONIX) IV  40 mg Intravenous Q12H   Continuous Infusions:  sodium chloride Stopped (05/31/23 0951)     LOS: 1 day     Tresa Moore, MD [facility]   If 7PM-7AM, please contact night-coverage  06/01/2023, 11:26 AM

## 2023-06-01 NOTE — Progress Notes (Signed)
GI Inpatient Follow-up Note  Subjective:  Patient seen and is doing well. No overt GI bleeding  Scheduled Inpatient Medications:   sodium chloride   Intravenous Once   amiodarone  200 mg Oral Daily   atorvastatin  10 mg Oral Daily   calcium-vitamin D  1 tablet Oral Q lunch   ferrous sulfate  324 mg Oral Daily   fludrocortisone  200 mcg Oral BID   furosemide  40 mg Intravenous BID   gabapentin  300 mg Oral BID   gabapentin  600 mg Oral QHS   Oxcarbazepine  300 mg Oral BID   pantoprazole  40 mg Oral BID    Continuous Inpatient Infusions:    sodium chloride Stopped (05/31/23 0951)    PRN Inpatient Medications:  acetaminophen, albuterol, clonazePAM, dextromethorphan-guaiFENesin, diphenhydrAMINE, hydrALAZINE, midodrine, senna  Review of Systems:  Review of Systems  Constitutional:  Positive for malaise/fatigue. Negative for chills and fever.  Respiratory:  Positive for shortness of breath.   Cardiovascular:  Negative for chest pain.  Gastrointestinal:  Negative for abdominal pain, blood in stool, constipation, diarrhea and melena.  Musculoskeletal:  Positive for joint pain.  Skin:  Negative for rash.  Neurological:  Negative for focal weakness.  Psychiatric/Behavioral:  Negative for substance abuse.   All other systems reviewed and are negative.     Physical Examination: BP (!) 116/51   Pulse 64   Temp 98.2 F (36.8 C) (Oral)   Resp 15   SpO2 100%  Gen: NAD, alert and oriented x 4 HEENT: PEERLA, EOMI, Neck: supple, no JVD or thyromegaly Chest: No respiratory distress Abd: soft, non-tender, non-distended Ext: no edema Skin: no rash or lesions noted Lymph: no LAD  Data: Lab Results  Component Value Date   WBC 4.3 06/01/2023   HGB 7.2 (L) 06/01/2023   HCT 23.7 (L) 06/01/2023   MCV 99.2 06/01/2023   PLT 246 06/01/2023   Recent Labs  Lab 05/31/23 1617 05/31/23 2218 06/01/23 0458  HGB 7.7* 7.5* 7.2*   Lab Results  Component Value Date   NA 145  06/01/2023   K 3.2 (L) 06/01/2023   CL 103 06/01/2023   CO2 34 (H) 06/01/2023   BUN 18 06/01/2023   CREATININE 1.16 (H) 06/01/2023   Lab Results  Component Value Date   ALT 12 11/02/2022   AST 17 11/02/2022   ALKPHOS 101 11/02/2022   BILITOT 0.7 11/02/2022   Recent Labs  Lab 05/31/23 0844 05/31/23 0930  APTT  --  29  INR 1.3*  --    Assessment/Plan: Ms. Gargis is a 89 y.o. lady with history of HFpEF, a. Fib on DOAC, orthostatic hypotension, chronic anemia, CKD III, and other comorbidities here with acute on chronic heart failure exacerbation and IDA. Given heart failure exacerbation and no overt GI bleeding, will not plan on any procedures as her family would also like to avoid any invasive procedures.   Recommendations:  - treatment of heart failure exacerbation per primary team - maintain active type and scree and transfuse for hemoglobin < 7 - no plans for luminal evaluation, can discuss as an outpatient if patient/family would be interested - in terms of restarting DOAC, it's a risk/benefit discussion with patient but I would be ok with re-starting at discharge - continue PPI PO   Will follow peripherally, please call with any questions or concerns.  Merlyn Lot MD, MPH [facility]

## 2023-06-01 NOTE — ED Notes (Signed)
Pt family member states pt was previously on purewick, was not currently when this tech walked in room, pt family member states "they removed it when we got to this room because they told us they do not use them on the floors". Pt struggles with BP dropping when pt sits up or stands up. Pt placed on a new purewick by this tech, with OK from pt primary RN.

## 2023-06-02 DIAGNOSIS — I5033 Acute on chronic diastolic (congestive) heart failure: Secondary | ICD-10-CM | POA: Diagnosis not present

## 2023-06-02 LAB — BPAM RBC
Blood Product Expiration Date: 202408152359
Blood Product Expiration Date: 202408192359
ISSUE DATE / TIME: 202407131028
ISSUE DATE / TIME: 202407141145
Unit Type and Rh: 5100
Unit Type and Rh: 5100

## 2023-06-02 LAB — CBC WITH DIFFERENTIAL/PLATELET
Abs Immature Granulocytes: 0.01 10*3/uL (ref 0.00–0.07)
Basophils Absolute: 0 10*3/uL (ref 0.0–0.1)
Basophils Relative: 0 %
Eosinophils Absolute: 0.2 10*3/uL (ref 0.0–0.5)
Eosinophils Relative: 3 %
HCT: 31.1 % — ABNORMAL LOW (ref 36.0–46.0)
Hemoglobin: 9.5 g/dL — ABNORMAL LOW (ref 12.0–15.0)
Immature Granulocytes: 0 %
Lymphocytes Relative: 16 %
Lymphs Abs: 0.9 10*3/uL (ref 0.7–4.0)
MCH: 29.5 pg (ref 26.0–34.0)
MCHC: 30.5 g/dL (ref 30.0–36.0)
MCV: 96.6 fL (ref 80.0–100.0)
Monocytes Absolute: 0.8 10*3/uL (ref 0.1–1.0)
Monocytes Relative: 14 %
Neutro Abs: 3.7 10*3/uL (ref 1.7–7.7)
Neutrophils Relative %: 67 %
Platelets: 265 10*3/uL (ref 150–400)
RBC: 3.22 MIL/uL — ABNORMAL LOW (ref 3.87–5.11)
RDW: 14.4 % (ref 11.5–15.5)
WBC: 5.6 10*3/uL (ref 4.0–10.5)
nRBC: 0 % (ref 0.0–0.2)

## 2023-06-02 LAB — TYPE AND SCREEN
ABO/RH(D): O POS
Antibody Screen: NEGATIVE
Unit division: 0
Unit division: 0

## 2023-06-02 LAB — BASIC METABOLIC PANEL
Anion gap: 9 (ref 5–15)
BUN: 22 mg/dL (ref 8–23)
CO2: 34 mmol/L — ABNORMAL HIGH (ref 22–32)
Calcium: 8.3 mg/dL — ABNORMAL LOW (ref 8.9–10.3)
Chloride: 101 mmol/L (ref 98–111)
Creatinine, Ser: 1.39 mg/dL — ABNORMAL HIGH (ref 0.44–1.00)
GFR, Estimated: 36 mL/min — ABNORMAL LOW (ref >60)
Glucose, Bld: 99 mg/dL (ref 70–99)
Potassium: 3 mmol/L — ABNORMAL LOW (ref 3.5–5.1)
Sodium: 144 mmol/L (ref 135–145)

## 2023-06-02 LAB — ECHOCARDIOGRAM COMPLETE
AR max vel: 1.95 cm2
AV Area VTI: 1.99 cm2
AV Area mean vel: 2.12 cm2
AV Mean grad: 8 mmHg
AV Peak grad: 15.9 mmHg
Ao pk vel: 2 m/s
Area-P 1/2: 2.53 cm2
S' Lateral: 3.3 cm

## 2023-06-02 MED ORDER — OMEPRAZOLE 20 MG PO CPDR: 20.0000 mg | DELAYED_RELEASE_CAPSULE | Freq: Two times a day (BID) | ORAL | 0 refills | Status: AC

## 2023-06-02 MED ORDER — POTASSIUM CHLORIDE CRYS ER 20 MEQ PO TBCR
60.0000 meq | EXTENDED_RELEASE_TABLET | Freq: Once | ORAL | Status: AC
  Administered 2023-06-02: 60 meq via ORAL
  Filled 2023-06-02: qty 3

## 2023-06-02 NOTE — Progress Notes (Signed)
Transition of Care [facility]) - Inpatient Brief Assessment   Patient Details  Name: Tricia Edwards MRN: [id number] Date of Birth: 1930-08-12  Transition of Care [facility] CM/SW Contact:    Truddie Hidden, RN Phone Number: 06/02/2023, 10:04 AM   Clinical Narrative: TOC assessing for ongoing needs and discharge planning.   Transition of Care Asessment: Insurance and Status: Insurance coverage has been reviewed Patient has primary care physician: Yes Home environment has been reviewed: Return to previous environment Prior level of function:: Family assistance Prior/Current Home Services: No current home services Social Determinants of Health Reivew: SDOH reviewed no interventions necessary Readmission risk has been reviewed: Yes Transition of care needs: no transition of care needs at this time

## 2023-06-02 NOTE — Discharge Summary (Signed)
Physician Discharge Summary  Tricia Edwards XBM:[id number] DOB: April 30, 1930 DOA: 05/31/2023  PCP: [facility]  Admit date: 05/31/2023 Discharge date: 06/02/2023  Admitted From: Home Disposition:  Home  Recommendations for Outpatient Follow-up:  Follow up with PCP in 1-2 weeks   Home Health:No Equipment/Devices:None   Discharge Condition:Stable  CODE STATUS:DNR Diet recommendation: Reg  Brief/Interim Summary:   89 y.o. female with medical history significant of A fib on Eliquis, dCHF, HTN, HLD, COPD, CKD-3a, anemia, chronic orthostatic hypotension, trigeminal neuralgia, who presents with SOB and dark stool.   Patient states that she has shortness of breath for almost a week, associated with dry cough, no chest pain fever or chills.  She also reports some weight gain recently.  She has generalized weakness, no unilateral numbness or tingling in extremities.  No facial droop or slurred speech.  She has dark stool chronically, no nausea, vomiting or abdominal pain.  No symptoms of UTI.   Discharge Diagnoses:  Principal Problem:   Acute on chronic diastolic CHF (congestive heart failure) (HCC) Active Problems:   Iron deficiency anemia   Symptomatic anemia   Chronic orthostatic hypotension   Atrial fibrillation, chronic (HCC)   Chronic kidney disease, stage 3a (HCC)   Hyperlipidemia   Trigeminal neuralgia of left side of face   HTN (hypertension)   Hypokalemia  Acute on chronic diastolic CHF (congestive heart failure) (HCC)  2D echo on 01/18/2021 showed EF 60 to 65%.  Patient has shortness of breath, elevated BNP 653, pulmonary edema on chest x-ray, positive JVD, 1+ leg edema, clinically consistent with CHF exacerbation.  No new oxygen requirement. Repeat echocardiogram normal ejection fraction Mild diastolic dysfunction Plan: No diuretics indicated at time of discharge.  Follow-up outpatient cardiology   Symptomatic anemia due to possible chronic GI blood  loss Chronic anticoagulation : Hgb 11.6 on 01/21/23 --> 6.2.has dark stool Received 1 unit PRBC Hemoglobin 7.2 posttransfusion Received IV iron Plan: Received 2 units PRBC while admitted.  Hemoglobin on discharge 9.5 No plans for endoscopic evaluation P.o. PPI twice daily.  Omeprazole prescribed on discharge Holding Eliquis for a period of 1 week.  Can restart Friday 7/19    Discharge Instructions  Discharge Instructions     Diet - low sodium heart healthy   Complete by: As directed    Increase activity slowly   Complete by: As directed       Allergies as of 06/02/2023       Reactions   Pneumovax [pneumococcal Polysaccharide Vaccine]    Other reaction(s): Severe arm swelling   Pneumococcal Vaccine    Other reaction(s): Unknown        Medication List     STOP taking these medications    clonazePAM 0.5 MG tablet Commonly known as: KlonoPIN   fluticasone-salmeterol 250-50 MCG/ACT Aepb Commonly known as: Advair Diskus   levalbuterol 45 MCG/ACT inhaler Commonly known as: XOPENEX HFA   phenytoin 100 MG ER capsule Commonly known as: DILANTIN       TAKE these medications    amiodarone 200 MG tablet Commonly known as: PACERONE Take 1 tablet (200 mg total) by mouth daily.   atorvastatin 10 MG tablet Commonly known as: LIPITOR Take 10 mg by mouth daily.   CALCIUM 600/VITAMIN D PO Take 1 tablet by mouth in the morning and at bedtime.   Eliquis 5 MG Tabs tablet Generic drug: apixaban Take 5 mg by mouth 2 (two) times daily.   ferrous sulfate 324 MG Tbec Take 324 mg by  mouth 3 (three) times a week. Take 1 tablet every morning, and 1 tablet in the evening every Wednesday and Saturday   fludrocortisone 0.1 MG tablet Commonly known as: FLORINEF Take 2 tablets (200 mcg total) by mouth 2 (two) times daily.   gabapentin 300 MG capsule Commonly known as: NEURONTIN Take 300-600 mg by mouth 3 (three) times daily.   midodrine 10 MG tablet Commonly known as:  PROAMATINE Take 10 mg by mouth 3 (three) times daily.   MiraLax 17 GM/SCOOP powder Generic drug: polyethylene glycol powder Take 17 g by mouth as needed for mild constipation.   omeprazole 20 MG capsule Commonly known as: PRILOSEC Take 1 capsule (20 mg total) by mouth 2 (two) times daily before a meal. What changed: when to take this   Oxcarbazepine 300 MG tablet Commonly known as: TRILEPTAL Take 300 mg by mouth 2 (two) times daily.   potassium chloride SA 20 MEQ tablet Commonly known as: KLOR-CON M Take 20 mEq by mouth daily.   senna 8.6 MG Tabs tablet Commonly known as: SENOKOT Take 1 tablet by mouth daily as needed for mild constipation.        Allergies  Allergen Reactions   Pneumovax [Pneumococcal Polysaccharide Vaccine]     Other reaction(s): Severe arm swelling   Pneumococcal Vaccine     Other reaction(s): Unknown    Consultations: None   Procedures/Studies: ECHOCARDIOGRAM COMPLETE  Result Date: 06/02/2023    ECHOCARDIOGRAM REPORT   Patient Name:   Tricia Edwards Date of Exam: 06/01/2023 Medical Rec #:  [id number]       Height:       67.0 in Accession #:    [id number]      Weight:       172.0 lb Date of Birth:  1930/08/07       BSA:          1.897 m Patient Age:    89 years        BP:           99/53 mmHg Patient Gender: F               HR:           67 bpm. Exam Location:  [facility] Procedure: 2D Echo Indications:     CHF I50.31  History:         Patient has prior history of Echocardiogram examinations, most                  recent 01/18/2021.  Sonographer:     Overton Mam RDCS, FASE Referring Phys:  [id number] Brien Few NIU Diagnosing Phys: Lorine Bears MD IMPRESSIONS  1. Left ventricular ejection fraction, by estimation, is 60 to 65%. The left ventricle has normal function. The left ventricle has no regional wall motion abnormalities. There is mild left ventricular hypertrophy. Left ventricular diastolic parameters are indeterminate.  2. Right ventricular systolic function  is normal. The right ventricular size is normal. There is normal pulmonary artery systolic pressure.  3. Left atrial size was moderately dilated.  4. The mitral valve is normal in structure. Mild to moderate mitral valve regurgitation. No evidence of mitral stenosis.  5. The aortic valve is normal in structure. Aortic valve regurgitation is not visualized. Mild aortic valve stenosis. Aortic valve area, by VTI measures 1.99 cm. Aortic valve mean gradient measures 8.0 mmHg.  6. The inferior vena cava is normal in size with greater than 50% respiratory variability, suggesting right atrial pressure of 3  mmHg. FINDINGS  Left Ventricle: Left ventricular ejection fraction, by estimation, is 60 to 65%. The left ventricle has normal function. The left ventricle has no regional wall motion abnormalities. The left ventricular internal cavity size was normal in size. There is  mild left ventricular hypertrophy. Left ventricular diastolic parameters are indeterminate. Right Ventricle: The right ventricular size is normal. No increase in right ventricular wall thickness. Right ventricular systolic function is normal. There is normal pulmonary artery systolic pressure. The tricuspid regurgitant velocity is 2.78 m/s, and  with an assumed right atrial pressure of 3 mmHg, the estimated right ventricular systolic pressure is 33.9 mmHg. Left Atrium: Left atrial size was moderately dilated. Right Atrium: Right atrial size was normal in size. Pericardium: There is no evidence of pericardial effusion. Mitral Valve: The mitral valve is normal in structure. Mild to moderate mitral valve regurgitation. No evidence of mitral valve stenosis. Tricuspid Valve: The tricuspid valve is normal in structure. Tricuspid valve regurgitation is trivial. No evidence of tricuspid stenosis. Aortic Valve: The aortic valve is normal in structure. Aortic valve regurgitation is not visualized. Mild aortic stenosis is present. Aortic valve mean gradient  measures 8.0 mmHg. Aortic valve peak gradient measures 15.9 mmHg. Aortic valve area, by VTI measures 1.99 cm. Pulmonic Valve: The pulmonic valve was normal in structure. Pulmonic valve regurgitation is not visualized. No evidence of pulmonic stenosis. Aorta: The aortic root is normal in size and structure. Venous: The inferior vena cava is normal in size with greater than 50% respiratory variability, suggesting right atrial pressure of 3 mmHg. IAS/Shunts: No atrial level shunt detected by color flow Doppler.  LEFT VENTRICLE PLAX 2D LVIDd:         5.00 cm   Diastology LVIDs:         3.30 cm   LV e' medial:    6.64 cm/s LV PW:         1.50 cm   LV E/e' medial:  15.5 LV IVS:        1.20 cm   LV e' lateral:   9.25 cm/s LVOT diam:     1.80 cm   LV E/e' lateral: 11.1 LV SV:         91 LV SV Index:   48 LVOT Area:     2.54 cm  RIGHT VENTRICLE RV Basal diam:  3.00 cm RV S prime:     15.40 cm/s TAPSE (M-mode): 2.3 cm LEFT ATRIUM             Index        RIGHT ATRIUM           Index LA diam:        5.00 cm 2.64 cm/m   RA Area:     14.00 cm LA Vol (A2C):   78.9 ml 41.60 ml/m  RA Volume:   35.70 ml  18.82 ml/m LA Vol (A4C):   73.8 ml 38.91 ml/m LA Biplane Vol: 78.5 ml 41.39 ml/m  AORTIC VALVE                     PULMONIC VALVE AV Area (Vmax):    1.95 cm      PV Vmax:       1.12 m/s AV Area (Vmean):   2.12 cm      PV Peak grad:  5.0 mmHg AV Area (VTI):     1.99 cm AV Vmax:           199.50 cm/s  AV Vmean:          125.000 cm/s AV VTI:            0.457 m AV Peak Grad:      15.9 mmHg AV Mean Grad:      8.0 mmHg LVOT Vmax:         153.00 cm/s LVOT Vmean:        104.000 cm/s LVOT VTI:          0.357 m LVOT/AV VTI ratio: 0.78  AORTA Ao Root diam: 3.20 cm Ao Asc diam:  2.80 cm MITRAL VALVE                TRICUSPID VALVE MV Area (PHT): 2.53 cm     TR Peak grad:   30.9 mmHg MV Decel Time: 300 msec     TR Vmax:        278.00 cm/s MV E velocity: 103.00 cm/s MV A velocity: 73.90 cm/s   SHUNTS MV E/A ratio:  1.39         Systemic  VTI:  0.36 m                             Systemic Diam: 1.80 cm Lorine Bears MD Electronically signed by Lorine Bears MD Signature Date/Time: 06/02/2023/7:18:38 AM    Final    DG Chest 2 View  Result Date: 05/31/2023 CLINICAL DATA:  Shortness of breath EXAM: CHEST - 2 VIEW COMPARISON:  11/02/2022 FINDINGS: Stable cardiomegaly. Aortic atherosclerosis. Bilateral interstitial opacities most pronounced within the perihilar and bibasilar regions. Trace bilateral pleural effusions. No pneumothorax. IMPRESSION: Appearance suggests CHF with mild pulmonary edema. Electronically Signed   By: Duanne Guess D.O.   On: 05/31/2023 09:44      Subjective: Seen and examined on the day of discharge.  Stable no distress.  Appropriate for discharge home.  Discharge Exam: Vitals:   06/02/23 0453 06/02/23 0838  BP: 132/71 (!) 102/38  Pulse: 64 67  Resp: 18 20  Temp: 98.5 F (36.9 C) 98.5 F (36.9 C)  SpO2: 93% 90%   Vitals:   06/01/23 2110 06/02/23 0406 06/02/23 0453 06/02/23 0838  BP:   132/71 (!) 102/38  Pulse:   64 67  Resp:   18 20  Temp:   98.5 F (36.9 C) 98.5 F (36.9 C)  TempSrc:   Oral   SpO2:   93% 90%  Weight:  75.3 kg    Height: 5\' 4"  (1.626 m)       General: Pt is alert, awake, not in acute distress Cardiovascular: RRR, S1/S2 +, no rubs, no gallops Respiratory: CTA bilaterally, no wheezing, no rhonchi Abdominal: Soft, NT, ND, bowel sounds + Extremities: no edema, no cyanosis    The results of significant diagnostics from this hospitalization (including imaging, microbiology, ancillary and laboratory) are listed below for reference.     Microbiology: No results found for this or any previous visit (from the past 240 hour(s)).   Labs: BNP (last 3 results) Recent Labs    11/02/22 0102 05/31/23 0844  BNP 524.7* 653.3*   Basic Metabolic Panel: Recent Labs  Lab 05/31/23 0844 06/01/23 0458 06/02/23 0806  NA 142 145 144  K 3.2* 3.2* 3.0*  CL 104 103 101  CO2 29  34* 34*  GLUCOSE 106* 91 99  BUN 20 18 22   CREATININE 1.09* 1.16* 1.39*  CALCIUM 8.1* 7.5* 8.3*  MG 2.1 1.8  --  Liver Function Tests: No results for input(s): "AST", "ALT", "ALKPHOS", "BILITOT", "PROT", "ALBUMIN" in the last 168 hours. No results for input(s): "LIPASE", "AMYLASE" in the last 168 hours. No results for input(s): "AMMONIA" in the last 168 hours. CBC: Recent Labs  Lab 05/31/23 0844 05/31/23 1617 05/31/23 2218 06/01/23 0458 06/02/23 0806  WBC 5.6 5.2 5.0 4.3 5.6  NEUTROABS 3.7  --   --   --  3.7  HGB 6.2* 7.7* 7.5* 7.2* 9.5*  HCT 21.0* 25.4* 24.8* 23.7* 31.1*  MCV 102.4* 98.4 98.8 99.2 96.6  PLT 269 265 262 246 265   Cardiac Enzymes: No results for input(s): "CKTOTAL", "CKMB", "CKMBINDEX", "TROPONINI" in the last 168 hours. BNP: Invalid input(s): "POCBNP" CBG: Recent Labs  Lab 06/01/23 0806  GLUCAP 82   D-Dimer No results for input(s): "DDIMER" in the last 72 hours. Hgb A1c No results for input(s): "HGBA1C" in the last 72 hours. Lipid Profile No results for input(s): "CHOL", "HDL", "LDLCALC", "TRIG", "CHOLHDL", "LDLDIRECT" in the last 72 hours. Thyroid function studies No results for input(s): "TSH", "T4TOTAL", "T3FREE", "THYROIDAB" in the last 72 hours.  Invalid input(s): "FREET3" Anemia work up Recent Labs    05/31/23 0856 05/31/23 1617 05/31/23 2218  VITAMINB12  --   --  527  FOLATE 10.4  --   --   FERRITIN 9*  --   --   TIBC 350  --   --   IRON 32  --   --   RETICCTPCT  --  6.5*  --    Urinalysis    Component Value Date/Time   COLORURINE STRAW (A) 05/22/2022 1700   APPEARANCEUR CLEAR (A) 05/22/2022 1700   LABSPEC 1.005 05/22/2022 1700   PHURINE 7.0 05/22/2022 1700   GLUCOSEU NEGATIVE 05/22/2022 1700   HGBUR NEGATIVE 05/22/2022 1700   BILIRUBINUR NEGATIVE 05/22/2022 1700   KETONESUR NEGATIVE 05/22/2022 1700   PROTEINUR NEGATIVE 05/22/2022 1700   NITRITE NEGATIVE 05/22/2022 1700   LEUKOCYTESUR NEGATIVE 05/22/2022 1700   Sepsis  Labs Recent Labs  Lab 05/31/23 1617 05/31/23 2218 06/01/23 0458 06/02/23 0806  WBC 5.2 5.0 4.3 5.6   Microbiology No results found for this or any previous visit (from the past 240 hour(s)).   Time coordinating discharge: Over 30 minutes  SIGNED:   Tresa Moore, MD  [facility] 06/02/2023, 12:08 PM Pager   If 7PM-7AM, please contact night-coverage

## 2023-06-02 NOTE — Discharge Instructions (Signed)

## 2023-06-03 ENCOUNTER — Inpatient Hospital Stay
Admission: EM | Admit: 2023-06-03 | Discharge: 2023-06-05 | DRG: 690 | Disposition: A | Discharge status: Home / Self Care | Payer: Medicare Other | Attending: Internal Medicine | Admitting: Internal Medicine

## 2023-06-03 ENCOUNTER — Emergency Department: Payer: Medicare Other

## 2023-06-03 ENCOUNTER — Other Ambulatory Visit: Payer: Self-pay

## 2023-06-03 DIAGNOSIS — Z7952 Long term (current) use of systemic steroids: Secondary | ICD-10-CM

## 2023-06-03 DIAGNOSIS — N390 Urinary tract infection, site not specified: Principal | ICD-10-CM | POA: Diagnosis present

## 2023-06-03 DIAGNOSIS — I5032 Chronic diastolic (congestive) heart failure: Secondary | ICD-10-CM | POA: Diagnosis not present

## 2023-06-03 DIAGNOSIS — R531 Weakness: Secondary | ICD-10-CM | POA: Diagnosis present

## 2023-06-03 DIAGNOSIS — Z87891 Personal history of nicotine dependence: Secondary | ICD-10-CM

## 2023-06-03 DIAGNOSIS — I130 Hypertensive heart and chronic kidney disease with heart failure and stage 1 through stage 4 chronic kidney disease, or unspecified chronic kidney disease: Secondary | ICD-10-CM | POA: Diagnosis present

## 2023-06-03 DIAGNOSIS — R29898 Other symptoms and signs involving the musculoskeletal system: Secondary | ICD-10-CM | POA: Diagnosis present

## 2023-06-03 DIAGNOSIS — Z8051 Family history of malignant neoplasm of kidney: Secondary | ICD-10-CM

## 2023-06-03 DIAGNOSIS — D649 Anemia, unspecified: Secondary | ICD-10-CM

## 2023-06-03 DIAGNOSIS — Z79899 Other long term (current) drug therapy: Secondary | ICD-10-CM

## 2023-06-03 DIAGNOSIS — D631 Anemia in chronic kidney disease: Secondary | ICD-10-CM | POA: Diagnosis present

## 2023-06-03 DIAGNOSIS — N179 Acute kidney failure, unspecified: Secondary | ICD-10-CM | POA: Diagnosis not present

## 2023-06-03 DIAGNOSIS — I480 Paroxysmal atrial fibrillation: Secondary | ICD-10-CM | POA: Diagnosis present

## 2023-06-03 DIAGNOSIS — R319 Hematuria, unspecified: Secondary | ICD-10-CM | POA: Diagnosis present

## 2023-06-03 DIAGNOSIS — E876 Hypokalemia: Secondary | ICD-10-CM | POA: Diagnosis present

## 2023-06-03 DIAGNOSIS — E860 Dehydration: Secondary | ICD-10-CM | POA: Diagnosis present

## 2023-06-03 DIAGNOSIS — Z8701 Personal history of pneumonia (recurrent): Secondary | ICD-10-CM

## 2023-06-03 DIAGNOSIS — D500 Iron deficiency anemia secondary to blood loss (chronic): Secondary | ICD-10-CM

## 2023-06-03 DIAGNOSIS — D509 Iron deficiency anemia, unspecified: Secondary | ICD-10-CM | POA: Diagnosis present

## 2023-06-03 DIAGNOSIS — E785 Hyperlipidemia, unspecified: Secondary | ICD-10-CM | POA: Diagnosis present

## 2023-06-03 DIAGNOSIS — I482 Chronic atrial fibrillation, unspecified: Secondary | ICD-10-CM

## 2023-06-03 DIAGNOSIS — Z887 Allergy status to serum and vaccine status: Secondary | ICD-10-CM

## 2023-06-03 DIAGNOSIS — G500 Trigeminal neuralgia: Secondary | ICD-10-CM | POA: Diagnosis present

## 2023-06-03 DIAGNOSIS — I951 Orthostatic hypotension: Secondary | ICD-10-CM | POA: Diagnosis not present

## 2023-06-03 DIAGNOSIS — N1831 Chronic kidney disease, stage 3a: Secondary | ICD-10-CM | POA: Diagnosis present

## 2023-06-03 DIAGNOSIS — R262 Difficulty in walking, not elsewhere classified: Secondary | ICD-10-CM | POA: Diagnosis present

## 2023-06-03 DIAGNOSIS — J449 Chronic obstructive pulmonary disease, unspecified: Secondary | ICD-10-CM | POA: Diagnosis present

## 2023-06-03 LAB — COMPREHENSIVE METABOLIC PANEL
ALT: 10 U/L (ref 0–44)
AST: 15 U/L (ref 15–41)
Albumin: 2.9 g/dL — ABNORMAL LOW (ref 3.5–5.0)
Alkaline Phosphatase: 104 U/L (ref 38–126)
Anion gap: 9 (ref 5–15)
BUN: 31 mg/dL — ABNORMAL HIGH (ref 8–23)
CO2: 32 mmol/L (ref 22–32)
Calcium: 7.9 mg/dL — ABNORMAL LOW (ref 8.9–10.3)
Chloride: 99 mmol/L (ref 98–111)
Creatinine, Ser: 1.48 mg/dL — ABNORMAL HIGH (ref 0.44–1.00)
GFR, Estimated: 33 mL/min — ABNORMAL LOW (ref >60)
Glucose, Bld: 79 mg/dL (ref 70–99)
Potassium: 3.4 mmol/L — ABNORMAL LOW (ref 3.5–5.1)
Sodium: 140 mmol/L (ref 135–145)
Total Bilirubin: 0.4 mg/dL (ref 0.3–1.2)
Total Protein: 6.3 g/dL — ABNORMAL LOW (ref 6.5–8.1)

## 2023-06-03 LAB — URINALYSIS, ROUTINE W REFLEX MICROSCOPIC
Bilirubin Urine: NEGATIVE
Glucose, UA: NEGATIVE mg/dL
Ketones, ur: NEGATIVE mg/dL
Nitrite: NEGATIVE
Protein, ur: 30 mg/dL — AB
Specific Gravity, Urine: 1.013 (ref 1.005–1.030)
pH: 7 (ref 5.0–8.0)

## 2023-06-03 LAB — CBC
HCT: 29.7 % — ABNORMAL LOW (ref 36.0–46.0)
Hemoglobin: 8.9 g/dL — ABNORMAL LOW (ref 12.0–15.0)
MCH: 29.8 pg (ref 26.0–34.0)
MCHC: 30 g/dL (ref 30.0–36.0)
MCV: 99.3 fL (ref 80.0–100.0)
Platelets: 251 10*3/uL (ref 150–400)
RBC: 2.99 MIL/uL — ABNORMAL LOW (ref 3.87–5.11)
RDW: 14 % (ref 11.5–15.5)
WBC: 5.8 10*3/uL (ref 4.0–10.5)
nRBC: 0 % (ref 0.0–0.2)

## 2023-06-03 LAB — TYPE AND SCREEN
ABO/RH(D): O POS
Antibody Screen: NEGATIVE

## 2023-06-03 LAB — LACTIC ACID, PLASMA
Lactic Acid, Venous: 0.8 mmol/L (ref 0.5–1.9)
Lactic Acid, Venous: 0.8 mmol/L (ref 0.5–1.9)

## 2023-06-03 LAB — BRAIN NATRIURETIC PEPTIDE: B Natriuretic Peptide: 372.4 pg/mL — ABNORMAL HIGH (ref 0.0–100.0)

## 2023-06-03 MED ORDER — GABAPENTIN 300 MG PO CAPS: 300.0000 mg | ORAL_CAPSULE | Freq: Three times a day (TID) | ORAL | Status: DC

## 2023-06-03 MED ORDER — AMIODARONE HCL 200 MG PO TABS
200.0000 mg | ORAL_TABLET | Freq: Every day | ORAL | Status: DC
  Administered 2023-06-04 – 2023-06-05 (×2): 200 mg via ORAL
  Filled 2023-06-03 (×2): qty 1

## 2023-06-03 MED ORDER — OXCARBAZEPINE 300 MG PO TABS
300.0000 mg | ORAL_TABLET | Freq: Two times a day (BID) | ORAL | Status: DC
  Administered 2023-06-03 – 2023-06-04 (×2): 300 mg via ORAL
  Filled 2023-06-03 (×2): qty 1

## 2023-06-03 MED ORDER — FERROUS SULFATE 325 (65 FE) MG PO TABS
324.0000 mg | ORAL_TABLET | ORAL | Status: DC
  Administered 2023-06-04: 324 mg via ORAL
  Filled 2023-06-03: qty 1

## 2023-06-03 MED ORDER — ACETAMINOPHEN 650 MG RE SUPP: 650.0000 mg | Freq: Four times a day (QID) | RECTAL | Status: DC | PRN

## 2023-06-03 MED ORDER — ONDANSETRON HCL 4 MG/2ML IJ SOLN: 4.0000 mg | Freq: Four times a day (QID) | INTRAMUSCULAR | Status: DC | PRN

## 2023-06-03 MED ORDER — POTASSIUM CHLORIDE CRYS ER 20 MEQ PO TBCR
20.0000 meq | EXTENDED_RELEASE_TABLET | Freq: Every day | ORAL | Status: DC
  Administered 2023-06-03 – 2023-06-05 (×3): 20 meq via ORAL
  Filled 2023-06-03 (×3): qty 1

## 2023-06-03 MED ORDER — GABAPENTIN 300 MG PO CAPS
300.0000 mg | ORAL_CAPSULE | Freq: Two times a day (BID) | ORAL | Status: DC
  Administered 2023-06-04: 300 mg via ORAL
  Filled 2023-06-03: qty 1

## 2023-06-03 MED ORDER — FLUDROCORTISONE ACETATE 0.1 MG PO TABS
200.0000 ug | ORAL_TABLET | Freq: Two times a day (BID) | ORAL | Status: DC
  Administered 2023-06-03 – 2023-06-05 (×4): 200 ug via ORAL
  Filled 2023-06-03 (×4): qty 2

## 2023-06-03 MED ORDER — SODIUM CHLORIDE 0.9% FLUSH
3.0000 mL | Freq: Two times a day (BID) | INTRAVENOUS | Status: DC
  Administered 2023-06-03 – 2023-06-04 (×3): 3 mL via INTRAVENOUS

## 2023-06-03 MED ORDER — ONDANSETRON HCL 4 MG PO TABS: 4.0000 mg | ORAL_TABLET | Freq: Four times a day (QID) | ORAL | Status: DC | PRN

## 2023-06-03 MED ORDER — SODIUM CHLORIDE 0.9 % IV BOLUS
250.0000 mL | Freq: Once | INTRAVENOUS | Status: AC
  Administered 2023-06-03: 250 mL via INTRAVENOUS

## 2023-06-03 MED ORDER — SODIUM CHLORIDE 0.9 % IV SOLN
1.0000 g | Freq: Once | INTRAVENOUS | Status: AC
  Administered 2023-06-03: 1 g via INTRAVENOUS
  Filled 2023-06-03: qty 10

## 2023-06-03 MED ORDER — SODIUM CHLORIDE 0.9 % IV SOLN
1.0000 g | INTRAVENOUS | Status: DC
  Administered 2023-06-04: 1 g via INTRAVENOUS
  Filled 2023-06-03 (×2): qty 10

## 2023-06-03 MED ORDER — ACETAMINOPHEN 325 MG PO TABS: 650.0000 mg | ORAL_TABLET | Freq: Four times a day (QID) | ORAL | Status: DC | PRN

## 2023-06-03 MED ORDER — GABAPENTIN 300 MG PO CAPS
600.0000 mg | ORAL_CAPSULE | Freq: Every day | ORAL | Status: DC
  Administered 2023-06-03: 600 mg via ORAL
  Filled 2023-06-03: qty 2

## 2023-06-03 MED ORDER — MIDODRINE HCL 5 MG PO TABS
5.0000 mg | ORAL_TABLET | Freq: Three times a day (TID) | ORAL | Status: DC | PRN
  Administered 2023-06-04: 10 mg via ORAL
  Filled 2023-06-03: qty 2

## 2023-06-03 MED ORDER — ATORVASTATIN CALCIUM 20 MG PO TABS
10.0000 mg | ORAL_TABLET | Freq: Every day | ORAL | Status: DC
  Administered 2023-06-03 – 2023-06-05 (×3): 10 mg via ORAL
  Filled 2023-06-03 (×3): qty 1

## 2023-06-03 NOTE — ED Notes (Signed)
Attempted to ambulate patient with Hailey RN, patient reported feeling weak and was unable to walk more than 3 steps. Dr. Cyril Loosen at bedside.

## 2023-06-03 NOTE — Assessment & Plan Note (Signed)
-   Continue home phenytoin

## 2023-06-03 NOTE — ED Provider Notes (Signed)
[facility] Provider Note    Event Date/Time   First MD Initiated Contact with Patient 06/03/23 1201     (approximate)   History   Weakness   HPI  Tricia Edwards is a 89 y.o. female newly discharged from the hospital with a history of CHF, pneumonia, atrial fibrillation, hypertension, orthostasis.  During hospitalization she received 2 units of PRBCs for anemia.  Was only discharged 2 days ago, reports she has become weaker and her legs are wobbly and she feels very fatigued     Physical Exam   Triage Vital Signs: ED Triage Vitals  Encounter Vitals Group     BP 06/03/23 1204 (!) 183/73     Systolic BP Percentile --      Diastolic BP Percentile --      Pulse Rate 06/03/23 1204 62     Resp 06/03/23 1204 18     Temp 06/03/23 1204 98.2 F (36.8 C)     Temp Source 06/03/23 1204 Oral     SpO2 06/03/23 1204 97 %     Weight 06/03/23 1201 76.2 kg (168 lb)     Height 06/03/23 1201 1.702 m (5\' 7" )     Head Circumference --      Peak Flow --      Pain Score 06/03/23 1201 0     Pain Loc --      Pain Education --      Exclude from Growth Chart --     Most recent vital signs: Vitals:   06/03/23 1230 06/03/23 1300  BP: (!) 177/86 (!) 178/65  Pulse: 62 (!) 58  Resp: 14 15  Temp:    SpO2: 92% 91%     General: Awake, no distress.  CV:  Good peripheral perfusion.  Resp:  Normal effort.  Abd:  No distention.  Other:     ED Results / Procedures / Treatments   Labs (all labs ordered are listed, but only abnormal results are displayed) Labs Reviewed  CBC - Abnormal; Notable for the following components:      Result Value   RBC 2.99 (*)    Hemoglobin 8.9 (*)    HCT 29.7 (*)    All other components within normal limits  COMPREHENSIVE METABOLIC PANEL - Abnormal; Notable for the following components:   Potassium 3.4 (*)    BUN 31 (*)    Creatinine, Ser 1.48 (*)    Calcium 7.9 (*)    Total Protein 6.3 (*)    Albumin 2.9 (*)    GFR,  Estimated 33 (*)    All other components within normal limits  URINALYSIS, ROUTINE W REFLEX MICROSCOPIC - Abnormal; Notable for the following components:   Color, Urine YELLOW (*)    APPearance CLEAR (*)    Hgb urine dipstick SMALL (*)    Protein, ur 30 (*)    Leukocytes,Ua MODERATE (*)    Bacteria, UA RARE (*)    All other components within normal limits  URINE CULTURE  LACTIC ACID, PLASMA  LACTIC ACID, PLASMA  TYPE AND SCREEN     EKG  ED ECG REPORT I, Jene Every, the attending physician, personally viewed and interpreted this ECG.  Date: 06/03/2023  Rhythm: normal sinus rhythm QRS Axis: normal Intervals: normal ST/T Wave abnormalities: normal Narrative Interpretation: no evidence of acute ischemia    RADIOLOGY Chest x-ray without acute abnormality    PROCEDURES:  Critical Care performed:   Procedures   MEDICATIONS ORDERED IN ED:  Medications  cefTRIAXone (ROCEPHIN) 1 g in sodium chloride 0.9 % 100 mL IVPB (has no administration in time range)  sodium chloride 0.9 % bolus 250 mL (0 mLs Intravenous Stopped 06/03/23 1439)     IMPRESSION / MDM / ASSESSMENT AND PLAN / ED COURSE  I reviewed the triage vital signs and the nursing notes. Patient's presentation is most consistent with acute presentation with potential threat to life or bodily function.  Patient presents with diffuse weakness as detailed above.  Given recent admission for anemia, differential includes recurrent anemia, GI bleed, infection, electrolyte normality, dehydration  Lab work is notable for hemoglobin of 8.9, decreased from 9.5 yesterday, this could be a source of her weakness, she denies bloody stools.  She also has elevated BUN to creatinine ratio consistent with dehydration  Finally she has evidence of urinary tract infection.  We attempted to ambulate the patient after IV fluids and she was only able to take 1 step before her legs buckled and she almost fell.  Given the above she will  require admission, have consulted the hospital team  Treat with IV Rocephin      FINAL CLINICAL IMPRESSION(S) / ED DIAGNOSES   Final diagnoses:  Weakness  Dehydration  Lower urinary tract infectious disease  Anemia, unspecified type     Rx / DC Orders   ED Discharge Orders     None        Note:  This document was prepared using Dragon voice recognition software and may include unintentional dictation errors.   Jene Every, MD 06/03/23 1455

## 2023-06-03 NOTE — ED Triage Notes (Signed)
Patient states recently discharged from [facility]; increased weakness since discharge.

## 2023-06-03 NOTE — Assessment & Plan Note (Addendum)
Per chart review, cardiology has patient on midodrine 3 times daily as needed with the following parameters: Systolic pressure less than 150 = take 5 mg  systolic pressure between 150 and 160 = take 10 mg, systolic pressure above 160 = don't take any midodrine  - Orthostatic vital signs ordered - Continue home Florinef and PRN midodrine

## 2023-06-03 NOTE — Assessment & Plan Note (Signed)
Telemetry and  EKG currently demonstrating sinus rhythm  - Telemetry monitoring given concern for intermittent RVR - Continue to hold home Eliquis

## 2023-06-03 NOTE — Assessment & Plan Note (Signed)
Patient appears euvolemic at this time.  She was not discharged on any diuretics.  Will continue to hold

## 2023-06-03 NOTE — H&P (Addendum)
History and Physical    Patient: Tricia Edwards NGE:[id number] DOB: Apr 09, 1930 DOA: 06/03/2023 DOS: the patient was seen and examined on 06/03/2023 PCP: [facility]  Patient coming from: Home  Chief Complaint:  Chief Complaint  Patient presents with   Weakness   HPI: Tricia Edwards is a 89 y.o. female with medical history significant of A-fib on current Eliquis holiday, diastolic heart failure, hypertension, hyperlipidemia, COPD, CKD stage IIIa, recent GI bleed with associated anemia, orthostatic hypotension, trigeminal neuralgia, who presents to the ED due to bilateral lower extremity weakness.  Tricia Edwards states that she was recently discharged from the hospital yesterday and since arriving home, she overall feels okay.  She notes that yesterday, she was having some trouble walking as she felt that her legs were going to give out below her.  She did not have any falls though.  Her lower extremity weakness continued today and that is why she came back to the ED.  She denies any focal weakness, and states her upper extremity strength seems intact.  Her son at bedside notes that they took her vital signs when she tries to walk and it seems that her blood pressure fluctuates and her heart rate becomes very high.  They were concerned this may be atrial fibrillation related.  [name] denies any fever, chills, nausea, vomiting, diarrhea, abdominal pain, dysuria, hematuria, urinary frequency/urgency, shortness of breath or chest pain.  ED course: On arrival to the ED, patient was hypertensive at 178/65 with heart rate of 59.  She is saturating at 91% on room air.  She was afebrile at 98.2.  Initial blood work notable for hemoglobin of 8.9, potassium 3.4, creatinine 1.48, calcium 7.9, albumin 2.9, total protein 6.3 and GFR 33.  Lactic acid negative at 0.8.  Urinalysis with small hematuria, moderate leukocytes, rare bacteria.  Chest x-ray with mild interstitial thickening only.   Patient started on IV fluids and Rocephin.  [facility] contacted for admission.  Review of Systems: As mentioned in the history of present illness. All other systems reviewed and are negative.  Past Medical History:  Diagnosis Date   COPD (chronic obstructive pulmonary disease) (HCC)    Hypertension    Paroxysmal A-fib (HCC)    Past Surgical History:  Procedure Laterality Date   APPENDECTOMY     CARDIOVERSION N/A 10/31/2022   Procedure: CARDIOVERSION;  Surgeon: Antonieta Iba, MD;  Location: [facility] ORS;  Service: Cardiovascular;  Laterality: N/A;   KNEE SURGERY     Social History:  reports that she has quit smoking. She has never used smokeless tobacco. She reports that she does not currently use alcohol. She reports that she does not currently use drugs.  Allergies  Allergen Reactions   Pneumovax [Pneumococcal Polysaccharide Vaccine]     Other reaction(s): Severe arm swelling   Pneumococcal Vaccine     Other reaction(s): Unknown    Family History  Problem Relation Age of Onset   Kidney cancer Sister    Intracerebral hemorrhage Brother     Prior to Admission medications   Medication Sig Start Date End Date Taking? Authorizing Provider  amiodarone (PACERONE) 200 MG tablet Take 1 tablet (200 mg total) by mouth daily. 04/29/23   Antonieta Iba, MD  atorvastatin (LIPITOR) 10 MG tablet Take 10 mg by mouth daily. 12/11/21   [provider]  Calcium Carb-Cholecalciferol (CALCIUM 600/VITAMIN D PO) Take 1 tablet by mouth in the morning and at bedtime.    [provider]  Everlene Balls  5 MG TABS tablet Take 5 mg by mouth 2 (two) times daily. 12/18/21   [provider]  ferrous sulfate 324 MG TBEC Take 324 mg by mouth 3 (three) times a week. Take 1 tablet every morning, and 1 tablet in the evening every Wednesday and Saturday    [provider]  fludrocortisone (FLORINEF) 0.1 MG tablet Take 2 tablets (200 mcg total) by mouth 2 (two) times daily. 04/29/23   Antonieta Iba, MD  gabapentin (NEURONTIN) 300 MG capsule Take 300-600 mg by mouth 3 (three) times daily. 12/12/21   [provider]  midodrine (PROAMATINE) 10 MG tablet Take 10 mg by mouth 3 (three) times daily. 04/12/22   [provider]  omeprazole (PRILOSEC) 20 MG capsule Take 1 capsule (20 mg total) by mouth 2 (two) times daily before a meal. 06/02/23 07/02/23  Sreenath, Jonelle Sports, MD  Oxcarbazepine (TRILEPTAL) 300 MG tablet Take 300 mg by mouth 2 (two) times daily. 04/03/23   [provider]  polyethylene glycol powder (MIRALAX) 17 GM/SCOOP powder Take 17 g by mouth as needed for mild constipation.    [provider]  potassium chloride SA (KLOR-CON M) 20 MEQ tablet Take 20 mEq by mouth daily.    [provider]  senna (SENOKOT) 8.6 MG TABS tablet Take 1 tablet by mouth daily as needed for mild constipation.    [provider]    Physical Exam: Vitals:   06/03/23 1300 06/03/23 1500 06/03/23 1727 06/03/23 1832  BP: (!) 178/65 104/61 (!) 160/76 (!) 150/126  Pulse: (!) 58 62 61 63  Resp: 15 15 18 18   Temp:    98.3 F (36.8 C)  TempSrc:      SpO2: 91% 94% 94% 92%  Weight:      Height:       Physical Exam Vitals and nursing note reviewed.  Constitutional:      General: She is not in acute distress.    Appearance: She is normal weight. She is not toxic-appearing.  HENT:     Mouth/Throat:     Mouth: Mucous membranes are moist.     Pharynx: Oropharynx is clear.  Eyes:     Conjunctiva/sclera: Conjunctivae normal.     Pupils: Pupils are equal, round, and reactive to light.  Cardiovascular:     Rate and Rhythm: Normal rate and regular rhythm.     Heart sounds: No murmur heard. Pulmonary:     Effort: Pulmonary effort is normal. No respiratory distress.     Breath sounds: Rales (Minimal bibasilar) present. No wheezing or rhonchi.  Abdominal:     General: Bowel sounds are normal. There is no distension.     Palpations: Abdomen is soft.      Tenderness: There is no abdominal tenderness. There is no guarding.  Musculoskeletal:     Right lower leg: No edema.     Left lower leg: No edema.  Skin:    General: Skin is warm and dry.  Neurological:     General: No focal deficit present.     Mental Status: She is alert and oriented to person, place, and time. Mental status is at baseline.     Comments: 5 out of 5 strength throughout.  Sensation intact throughout  Psychiatric:        Mood and Affect: Mood normal.        Behavior: Behavior normal.    Data Reviewed: CBC with WBC of 5.8, hemoglobin 8.9, MCV of 99, platelets  of 251 CMP with sodium of 140, potassium 3.4, bicarb 32, glucose 79, BUN 31, creatinine 1.48, calcium 7.9, albumin 2.9, AST 15, ALT 10, total protein 6.3 and GFR 33 Lactic acid 0.8 Urinalysis with small hematuria, moderate leukocytes, proteinuria, rare bacteria and 21-50 WBC/hpf  Per chart review, right-sided pleural effusion seems to have developed EKG personally reviewed.  Sinus rhythm with rate of 59.  No ischemic appearing ST or T wave changes.  DG Chest Port 1 View  Result Date: 06/03/2023 CLINICAL DATA:  Persistent weakness. Recently discharged from hospital. COPD. Atrial fibrillation. Hypertension. EXAM: PORTABLE CHEST 1 VIEW COMPARISON:  05/31/2023 FINDINGS: Single AP view of the chest with minimal exclusion of the left costophrenic angle. Patient rotated to the right. Mild cardiomegaly. Atherosclerosis in the transverse aorta. Tortuous thoracic aorta. No large pleural effusions. Pulmonary interstitial thickening is similar, given differences in technique. No overt congestive failure or lobar consolidation. IMPRESSION: Cardiomegaly and mild interstitial thickening which could represent pulmonary venous congestion or be the sequelae of chronic bronchitis/COPD. No overt congestive failure or lobar consolidation. Aortic Atherosclerosis (ICD10-I70.0). Electronically Signed   By: Jeronimo Greaves M.D.   On: 06/03/2023  13:43    Results are pending, will review when available.  Assessment and Plan:  * Lower extremity weakness Patient presenting with bilateral lower extremity weakness that has been present since she was discharged from the hospital yesterday.  She was unable to walk in the ED without a 3 person assist.  No focal weakness on examination to suggest central process such as CVA.  Given fluctuations in vital signs reported by family, will keep on telemetry monitoring.  Urinalysis is concerning for UTI, however patient is not experiencing any urinary or abdominal symptoms.  Will treat at this time for now.  - Telemetry monitoring - Orthostatic vital signs - PT/OT - Continue ceftriaxone pending urine culture data - Continue home Florinef and PRN midodrine  AKI (acute kidney injury) (HCC) Patient's creatinine has been gradually climbing since her most recent admission, currently 1.48.  Her previous baseline was around 1.1.  She appears largely euvolemic on examination, however she was diuresed on most recent admission.  She has received a small bolus in the ED.  - IV fluid as ordered - Repeat BMP in the a.m. - Bladder ultrasound to evaluate for retention - Avoid nephrotoxic agents  Chronic orthostatic hypotension Per chart review, cardiology has patient on midodrine 3 times daily as needed with the following parameters: Systolic pressure less than 150 = take 5 mg  systolic pressure between 150 and 160 = take 10 mg, systolic pressure above 160 = don't take any midodrine  - Orthostatic vital signs ordered - Continue home Florinef and PRN midodrine  Chronic diastolic congestive heart failure (HCC) Patient appears euvolemic at this time.  She was not discharged on any diuretics.  Will continue to hold  Iron deficiency anemia In the setting of recent GI bleed requiring transfusions.  Hemoglobin is stable at this time and Eliquis has been on hold.  - Repeat CBC in the a.m. - Transfuse for  hemoglobin less than 7  Atrial fibrillation, chronic (HCC) Telemetry and  EKG currently demonstrating sinus rhythm  - Telemetry monitoring given concern for intermittent RVR - Continue to hold home Eliquis  Trigeminal neuralgia of left side of face - Continue home phenytoin  Advance Care Planning:   Code Status: Full Code   Consults: None  Family Communication: Patient's son updated at bedside  Severity of Illness: The appropriate  patient status for this patient is OBSERVATION. Observation status is judged to be reasonable and necessary in order to provide the required intensity of service to ensure the patient's safety. The patient's presenting symptoms, physical exam findings, and initial radiographic and laboratory data in the context of their medical condition is felt to place them at decreased risk for further clinical deterioration. Furthermore, it is anticipated that the patient will be medically stable for discharge from the hospital within 2 midnights of admission.   Author: Verdene Lennert, MD 06/03/2023 7:06 PM  For on call review [email or web address].

## 2023-06-03 NOTE — Assessment & Plan Note (Signed)
In the setting of recent GI bleed requiring transfusions.  Hemoglobin is stable at this time and Eliquis has been on hold.  - Repeat CBC in the a.m. - Transfuse for hemoglobin less than 7

## 2023-06-03 NOTE — Assessment & Plan Note (Addendum)
Patient presenting with bilateral lower extremity weakness that has been present since she was discharged from the hospital yesterday.  She was unable to walk in the ED without a 3 person assist.  No focal weakness on examination to suggest central process such as CVA.  Given fluctuations in vital signs reported by family, will keep on telemetry monitoring.  Urinalysis is concerning for UTI, however patient is not experiencing any urinary or abdominal symptoms.  Will treat at this time for now.  - Telemetry monitoring - Orthostatic vital signs - PT/OT - Continue ceftriaxone pending urine culture data - Continue home Florinef and PRN midodrine

## 2023-06-03 NOTE — Assessment & Plan Note (Addendum)
Patient's creatinine has been gradually climbing since her most recent admission, currently 1.48.  Her previous baseline was around 1.1.  She appears largely euvolemic on examination, however she was diuresed on most recent admission.  She has received a small bolus in the ED.  - IV fluid as ordered - Repeat BMP in the a.m. - Bladder ultrasound to evaluate for retention - Avoid nephrotoxic agents

## 2023-06-04 DIAGNOSIS — G500 Trigeminal neuralgia: Secondary | ICD-10-CM | POA: Diagnosis present

## 2023-06-04 DIAGNOSIS — R262 Difficulty in walking, not elsewhere classified: Secondary | ICD-10-CM | POA: Diagnosis present

## 2023-06-04 DIAGNOSIS — J449 Chronic obstructive pulmonary disease, unspecified: Secondary | ICD-10-CM | POA: Diagnosis present

## 2023-06-04 DIAGNOSIS — E785 Hyperlipidemia, unspecified: Secondary | ICD-10-CM | POA: Diagnosis present

## 2023-06-04 DIAGNOSIS — I480 Paroxysmal atrial fibrillation: Secondary | ICD-10-CM | POA: Diagnosis present

## 2023-06-04 DIAGNOSIS — N390 Urinary tract infection, site not specified: Secondary | ICD-10-CM

## 2023-06-04 DIAGNOSIS — E860 Dehydration: Secondary | ICD-10-CM | POA: Diagnosis present

## 2023-06-04 DIAGNOSIS — R29898 Other symptoms and signs involving the musculoskeletal system: Secondary | ICD-10-CM | POA: Diagnosis not present

## 2023-06-04 DIAGNOSIS — Z79899 Other long term (current) drug therapy: Secondary | ICD-10-CM | POA: Diagnosis not present

## 2023-06-04 DIAGNOSIS — R531 Weakness: Secondary | ICD-10-CM | POA: Diagnosis present

## 2023-06-04 DIAGNOSIS — Z87891 Personal history of nicotine dependence: Secondary | ICD-10-CM | POA: Diagnosis not present

## 2023-06-04 DIAGNOSIS — I951 Orthostatic hypotension: Secondary | ICD-10-CM | POA: Diagnosis present

## 2023-06-04 DIAGNOSIS — E876 Hypokalemia: Secondary | ICD-10-CM | POA: Diagnosis present

## 2023-06-04 DIAGNOSIS — N1831 Chronic kidney disease, stage 3a: Secondary | ICD-10-CM | POA: Diagnosis present

## 2023-06-04 DIAGNOSIS — Z8701 Personal history of pneumonia (recurrent): Secondary | ICD-10-CM | POA: Diagnosis not present

## 2023-06-04 DIAGNOSIS — I130 Hypertensive heart and chronic kidney disease with heart failure and stage 1 through stage 4 chronic kidney disease, or unspecified chronic kidney disease: Secondary | ICD-10-CM | POA: Diagnosis present

## 2023-06-04 DIAGNOSIS — Z8051 Family history of malignant neoplasm of kidney: Secondary | ICD-10-CM | POA: Diagnosis not present

## 2023-06-04 DIAGNOSIS — N179 Acute kidney failure, unspecified: Secondary | ICD-10-CM | POA: Diagnosis present

## 2023-06-04 DIAGNOSIS — Z7952 Long term (current) use of systemic steroids: Secondary | ICD-10-CM | POA: Diagnosis not present

## 2023-06-04 DIAGNOSIS — Z887 Allergy status to serum and vaccine status: Secondary | ICD-10-CM | POA: Diagnosis not present

## 2023-06-04 DIAGNOSIS — I5032 Chronic diastolic (congestive) heart failure: Secondary | ICD-10-CM | POA: Diagnosis present

## 2023-06-04 DIAGNOSIS — R319 Hematuria, unspecified: Secondary | ICD-10-CM | POA: Diagnosis present

## 2023-06-04 DIAGNOSIS — D631 Anemia in chronic kidney disease: Secondary | ICD-10-CM | POA: Diagnosis present

## 2023-06-04 LAB — BASIC METABOLIC PANEL
Anion gap: 6 (ref 5–15)
BUN: 22 mg/dL (ref 8–23)
CO2: 31 mmol/L (ref 22–32)
Calcium: 8 mg/dL — ABNORMAL LOW (ref 8.9–10.3)
Chloride: 103 mmol/L (ref 98–111)
Creatinine, Ser: 1.24 mg/dL — ABNORMAL HIGH (ref 0.44–1.00)
GFR, Estimated: 41 mL/min — ABNORMAL LOW (ref >60)
Glucose, Bld: 91 mg/dL (ref 70–99)
Potassium: 3.3 mmol/L — ABNORMAL LOW (ref 3.5–5.1)
Sodium: 140 mmol/L (ref 135–145)

## 2023-06-04 LAB — CBC
HCT: 28 % — ABNORMAL LOW (ref 36.0–46.0)
Hemoglobin: 8.9 g/dL — ABNORMAL LOW (ref 12.0–15.0)
MCH: 30.6 pg (ref 26.0–34.0)
MCHC: 31.8 g/dL (ref 30.0–36.0)
MCV: 96.2 fL (ref 80.0–100.0)
Platelets: 248 10*3/uL (ref 150–400)
RBC: 2.91 MIL/uL — ABNORMAL LOW (ref 3.87–5.11)
RDW: 14.1 % (ref 11.5–15.5)
WBC: 5.2 10*3/uL (ref 4.0–10.5)
nRBC: 0 % (ref 0.0–0.2)

## 2023-06-04 MED ORDER — OXCARBAZEPINE 300 MG PO TABS
300.0000 mg | ORAL_TABLET | Freq: Two times a day (BID) | ORAL | Status: DC
  Administered 2023-06-04 – 2023-06-05 (×2): 300 mg via ORAL
  Filled 2023-06-04 (×3): qty 1

## 2023-06-04 MED ORDER — MIDODRINE HCL 5 MG PO TABS
5.0000 mg | ORAL_TABLET | Freq: Three times a day (TID) | ORAL | Status: DC
  Administered 2023-06-04: 10 mg via ORAL
  Filled 2023-06-04: qty 2

## 2023-06-04 MED ORDER — GABAPENTIN 300 MG PO CAPS
300.0000 mg | ORAL_CAPSULE | Freq: Two times a day (BID) | ORAL | Status: DC
  Administered 2023-06-04 – 2023-06-05 (×2): 300 mg via ORAL
  Filled 2023-06-04 (×2): qty 1

## 2023-06-04 MED ORDER — POTASSIUM CHLORIDE CRYS ER 20 MEQ PO TBCR
40.0000 meq | EXTENDED_RELEASE_TABLET | Freq: Once | ORAL | Status: AC
  Administered 2023-06-04: 40 meq via ORAL
  Filled 2023-06-04: qty 2

## 2023-06-04 MED ORDER — FERROUS SULFATE 325 (65 FE) MG PO TABS
325.0000 mg | ORAL_TABLET | Freq: Every evening | ORAL | Status: DC
  Administered 2023-06-04: 325 mg via ORAL
  Filled 2023-06-04: qty 1

## 2023-06-04 MED ORDER — GABAPENTIN 300 MG PO CAPS
600.0000 mg | ORAL_CAPSULE | Freq: Every day | ORAL | Status: DC
  Administered 2023-06-04: 600 mg via ORAL
  Filled 2023-06-04: qty 2

## 2023-06-04 NOTE — Progress Notes (Addendum)
Progress Note    Tricia Edwards  NFA:[id number] DOB: 09-09-1930  DOA: 06/03/2023 PCP: [facility]      Brief Narrative:    Medical records reviewed and are as summarized below:  Tricia Edwards is a 89 y.o. female with medical history significant of A-fib on current Eliquis holiday, chronic diastolic heart failure, hypertension, hyperlipidemia, COPD, CKD stage IIIa, recent GI bleed with associated anemia, orthostatic hypotension, trigeminal neuralgia, discharged from the hospital on 06/02/2023 for CHF exacerbation and symptomatic anemia requiring  unit of blood transfusion.  She presented to the ED hospital with bilateral lower extremity weakness.     Assessment/Plan:   Principal Problem:   Lower extremity weakness Active Problems:   AKI (acute kidney injury) (HCC)   Chronic orthostatic hypotension   Chronic diastolic congestive heart failure (HCC)   Iron deficiency anemia   Trigeminal neuralgia of left side of face   Paroxysmal atrial fibrillation (HCC)   Acute UTI    Body mass index is 26.62 kg/m.   Bilateral extremity weakness: No focal deficits.  PT recommended home health therapy   AKI: Improving.  Creatinine is stable.   Hypokalemia: Replete potassium and monitor levels.   Probable acute UTI: Urine culture is pending.  Continue IV ceftriaxone.   Chronic orthostatic hypotension: Continue midodrine and fludrocortisone.  Midodrine order has been modified disorder patient can get midodrine while inpatient.  Trigeminal neuralgia on the left side of the face: Continue oxcarbazepine gabapentin.  Patient is scheduled to have a procedure in [place] on June 11, 2023.   Chronic diastolic CHF: Compensated.   Anemia of chronic disease, iron deficiency anemia: Continue iron supplements.  Daughter said patient takes ferrous sulfate 3 times a week and ferrous sulfate every evening. S/p recent transfusion with 1 units of PRBCs on  06/01/2023   Paroxysmal atrial fibrillation: Continue midodrine.  Eliquis is on hold.   Plan discussed with her daughter and son at the bedside.  Daughter wanted oxcarbazepine and gabapentin to be administered at specific times.  Orders have been modified to reflect this.   Diet Order             Diet regular Room service appropriate? Yes; Fluid consistency: Thin  Diet effective now                            Consultants: None  Procedures: None    Medications:    amiodarone  200 mg Oral Daily   atorvastatin  10 mg Oral Daily   ferrous sulfate  325 mg Oral QPM   fludrocortisone  200 mcg Oral BID   gabapentin  300 mg Oral BID   gabapentin  600 mg Oral QHS   midodrine  5-10 mg Oral TID WC   Oxcarbazepine  300 mg Oral BID   potassium chloride SA  20 mEq Oral Daily   sodium chloride flush  3 mL Intravenous Q12H   Continuous Infusions:  cefTRIAXone (ROCEPHIN)  IV       Anti-infectives (From admission, onward)    Start     Dose/Rate Route Frequency Ordered Stop   06/04/23 1500  cefTRIAXone (ROCEPHIN) 1 g in sodium chloride 0.9 % 100 mL IVPB        1 g 200 mL/hr over 30 Minutes Intravenous Every 24 hours 06/03/23 1749     06/03/23 1500  cefTRIAXone (ROCEPHIN) 1 g in sodium chloride 0.9 % 100 mL IVPB  1 g 200 mL/hr over 30 Minutes Intravenous  Once 06/03/23 1447 06/03/23 1635              Family Communication/Anticipated D/C date and plan/Code Status   DVT prophylaxis: SCDs Start: 06/03/23 1629     Code Status: Full Code  Family Communication: Plan discussed with her son and daughter at the bedside Disposition Plan: Plan to discharge home in 1 to 2 days   Status is: Observation The patient will require care spanning > 2 midnights and should be moved to inpatient because: On IV antibiotics for suspected UTI       Subjective:   Interval events noted.  She complained of dizziness with standing.  She complained that she did not  get her midodrine this morning.  No shortness of breath, chest pain or abdominal pain.  Her son and daughter were at the bedside.  Objective:    Vitals:   06/04/23 0154 06/04/23 0430 06/04/23 0432 06/04/23 0700  BP: (!) 131/52  (!) 118/40 (!) 116/42  Pulse: (!) 59 (!) 56 (!) 55 (!) 59  Resp: 17 18 18 16   Temp: 98.9 F (37.2 C) 97.8 F (36.6 C)  98.5 F (36.9 C)  TempSrc: Oral Oral  Oral  SpO2: 92% 93% 91% (!) 88%  Weight:   77.1 kg   Height:       Orthostatic VS for the past 24 hrs:  BP- Lying Pulse- Lying BP- Sitting Pulse- Sitting BP- Standing at 0 minutes Pulse- Standing at 0 minutes  06/04/23 0845 146/66 62 117/78 65 (!) 85/70 64     Intake/Output Summary (Last 24 hours) at 06/04/2023 1253 Last data filed at 06/04/2023 1027 Gross per 24 hour  Intake 250 ml  Output 550 ml  Net -300 ml   Filed Weights   06/03/23 1201 06/04/23 0432  Weight: 76.2 kg 77.1 kg    Exam:  GEN: NAD SKIN: No rash EYES: EOMI ENT: MMM CV: RRR PULM: CTA B ABD: soft, ND, NT, +BS CNS: AAO x 3, non focal EXT: No edema or tenderness        Data Reviewed:   I have personally reviewed following labs and imaging studies:  Labs: Labs show the following:   Basic Metabolic Panel: Recent Labs  Lab 05/31/23 0844 06/01/23 0458 06/02/23 0806 06/03/23 1210 06/04/23 0454  NA 142 145 144 140 140  K 3.2* 3.2* 3.0* 3.4* 3.3*  CL 104 103 101 99 103  CO2 29 34* 34* 32 31  GLUCOSE 106* 91 99 79 91  BUN 20 18 22  31* 22  CREATININE 1.09* 1.16* 1.39* 1.48* 1.24*  CALCIUM 8.1* 7.5* 8.3* 7.9* 8.0*  MG 2.1 1.8  --   --   --    GFR Estimated Creatinine Clearance: 32.3 mL/min (A) (by C-G formula based on SCr of 1.24 mg/dL (H)). Liver Function Tests: Recent Labs  Lab 06/03/23 1210  AST 15  ALT 10  ALKPHOS 104  BILITOT 0.4  PROT 6.3*  ALBUMIN 2.9*   No results for input(s): "LIPASE", "AMYLASE" in the last 168 hours. No results for input(s): "AMMONIA" in the last 168  hours. Coagulation profile Recent Labs  Lab 05/31/23 0844  INR 1.3*    CBC: Recent Labs  Lab 05/31/23 0844 05/31/23 1617 05/31/23 2218 06/01/23 0458 06/02/23 0806 06/03/23 1210 06/04/23 0454  WBC 5.6   < > 5.0 4.3 5.6 5.8 5.2  NEUTROABS 3.7  --   --   --  3.7  --   --  HGB 6.2*   < > 7.5* 7.2* 9.5* 8.9* 8.9*  HCT 21.0*   < > 24.8* 23.7* 31.1* 29.7* 28.0*  MCV 102.4*   < > 98.8 99.2 96.6 99.3 96.2  PLT 269   < > 262 246 265 251 248   < > = values in this interval not displayed.   Cardiac Enzymes: No results for input(s): "CKTOTAL", "CKMB", "CKMBINDEX", "TROPONINI" in the last 168 hours. BNP (last 3 results) No results for input(s): "PROBNP" in the last 8760 hours. CBG: Recent Labs  Lab 06/01/23 0806  GLUCAP 82   D-Dimer: No results for input(s): "DDIMER" in the last 72 hours. Hgb A1c: No results for input(s): "HGBA1C" in the last 72 hours. Lipid Profile: No results for input(s): "CHOL", "HDL", "LDLCALC", "TRIG", "CHOLHDL", "LDLDIRECT" in the last 72 hours. Thyroid function studies: No results for input(s): "TSH", "T4TOTAL", "T3FREE", "THYROIDAB" in the last 72 hours.  Invalid input(s): "FREET3" Anemia work up: No results for input(s): "VITAMINB12", "FOLATE", "FERRITIN", "TIBC", "IRON", "RETICCTPCT" in the last 72 hours. Sepsis Labs: Recent Labs  Lab 06/01/23 0458 06/02/23 0806 06/03/23 1210 06/03/23 1502 06/03/23 1903 06/04/23 0454  WBC 4.3 5.6 5.8  --   --  5.2  LATICACIDVEN  --   --   --  0.8 0.8  --     Microbiology No results found for this or any previous visit (from the past 240 hour(s)).  Procedures and diagnostic studies:  DG Chest Port 1 View  Result Date: 06/03/2023 CLINICAL DATA:  Persistent weakness. Recently discharged from hospital. COPD. Atrial fibrillation. Hypertension. EXAM: PORTABLE CHEST 1 VIEW COMPARISON:  05/31/2023 FINDINGS: Single AP view of the chest with minimal exclusion of the left costophrenic angle. Patient rotated to  the right. Mild cardiomegaly. Atherosclerosis in the transverse aorta. Tortuous thoracic aorta. No large pleural effusions. Pulmonary interstitial thickening is similar, given differences in technique. No overt congestive failure or lobar consolidation. IMPRESSION: Cardiomegaly and mild interstitial thickening which could represent pulmonary venous congestion or be the sequelae of chronic bronchitis/COPD. No overt congestive failure or lobar consolidation. Aortic Atherosclerosis (ICD10-I70.0). Electronically Signed   By: Jeronimo Greaves M.D.   On: 06/03/2023 13:43               LOS: 0 days   Steffani Dionisio  [facility]   Pager on [email or web address]. If 7PM-7AM, please contact night-coverage at [email or web address]     06/04/2023, 12:53 PM

## 2023-06-04 NOTE — Evaluation (Signed)
Occupational Therapy Evaluation Patient Details Name: Tricia Edwards MRN: [id number] DOB: [birth year] Today's Date: 06/04/2023   History of Present Illness Patient is a 89 year old female with bilateral lower extremity weakness that has been present since recent discharge from the hospital. History of CHF, pneumonia, atrial fibrillation, hypertension, orthostasis   Clinical Impression   Pt was seen for OT evaluation this date. Prior to hospital admission, pt required supervision for ADL performance and completes functional mobility with occ HHA, otherwise no AD at baseline. Pt lives with daughter (caregiver 24/7). Extensive DME at home should the need arise. Pt presents to acute OT demonstrating impaired ADL performance and functional mobility 2/2 orthostatic hypotension, decreased insight into deficits, decreased safety/environmental awareness and bilateral LE weakness (See OT problem list for additional functional deficits). Functional mobility not safe to complete with orthostatic hypotension this date. Anticipate pt would require supervision/setup with UB ADLs, and min-mod for LB ADLs. Dons socks semi-supine with supervision.  BP initially 163/63 in bed, sitting EOB 163/97, standing ~2 min 117/65, back sitting EOB 142/72. Alerted NT, MD and RN of vitals. SpO2% 97-99 on RA. Attempted second standing BP, but pt began to exhibit symptoms of dizziness and BLE buckling after standing ~1 min. Pt does not acknowledge symptoms, and requires firm, multimodal cuing for safety and to take seated rest break. Pt slightly impulsive, attempting to stand from sitting EOB without assist or waiting for BP to be assessed. Pt would benefit from skilled OT services to address noted impairments and functional limitations (see below for any additional details) in order to maximize safety and independence while minimizing falls risk and caregiver burden.      Recommendations for follow up therapy are one component of a  multi-disciplinary discharge planning process, led by the attending physician.  Recommendations may be updated based on patient status, additional functional criteria and insurance authorization.   Assistance Recommended at Discharge Intermittent Supervision/Assistance  Patient can return home with the following A little help with walking and/or transfers;A little help with bathing/dressing/bathroom;Assistance with cooking/housework;Direct supervision/assist for medications management;Direct supervision/assist for financial management;Assist for transportation;Help with stairs or ramp for entrance    Functional Status Assessment  Patient has had a recent decline in their functional status and demonstrates the ability to make significant improvements in function in a reasonable and predictable amount of time.  Equipment Recommendations  None recommended by OT       Precautions / Restrictions Precautions Precautions: Fall Precaution Comments: + orthostatic Restrictions Weight Bearing Restrictions: No      Mobility Bed Mobility Overal bed mobility: Needs Assistance Bed Mobility: Sit to Supine, Supine to Sit     Supine to sit: Supervision, HOB elevated Sit to supine: Min guard, HOB elevated   General bed mobility comments: increased time required    Transfers Overall transfer level: Needs assistance Equipment used: Rolling walker (2 wheels) Transfers: Sit to/from Stand Sit to Stand: Min assist           General transfer comment: MinA for first standing bout, pt stood for ~2 mins, required cuing as pt attempts to over exert herself and will not verbalize her symptoms of dizziness/weakness      Balance Overall balance assessment: Needs assistance Sitting-balance support: Feet supported Sitting balance-Leahy Scale: Good     Standing balance support: Single extremity supported Standing balance-Leahy Scale: Fair Standing balance comment: BLE weak and buckling during second  attempt. Requires firm, directional cuing to laterally side step towards HOB and sit down  ADL either performed or assessed with clinical judgement   ADL Overall ADL's : Needs assistance/impaired                     Lower Body Dressing: Bed level;Supervision/safety                 General ADL Comments: Functional mobility not safe to complete with orthostatic hypotension this date. Anticipate pt would require supervision/setup with UB ADLs, and min-mod for LB ADLs. Dons socks semi-supine with supervision.      Pertinent Vitals/Pain Pain Assessment Pain Assessment: No/denies pain        Extremity/Trunk Assessment Upper Extremity Assessment Upper Extremity Assessment: Generalized weakness   Lower Extremity Assessment Lower Extremity Assessment: Generalized weakness       Communication Communication Communication: No difficulties   Cognition Arousal/Alertness: Awake/alert Behavior During Therapy: Impulsive                                   General Comments: Poor safety awareness, pt attempting to stand without assist, needs firm cuing to redirect     General Comments  supportive son present during session            Home Living Family/patient expects to be discharged to:: Private residence Living Arrangements: Children Available Help at Discharge: Family;Available 24 hours/day Type of Home: House Home Access: Level entry     Home Layout: One level     Bathroom Shower/Tub: Chief Strategy Officer: Standard     Home Equipment: Agricultural consultant (2 wheels);Rollator (4 wheels);Cane - single point;Shower seat;Wheelchair - manual   Additional Comments: [place] at home      Prior Functioning/Environment Prior Level of Function : Independent/Modified Independent             Mobility Comments: patient ambulates a household distance independently and without device. supervision  provided from the daughter with occasional hand held assistance ADLs Comments: Patient and son report she performs ADLs supervision level        OT Problem List: Decreased strength;Decreased activity tolerance;Impaired balance (sitting and/or standing);Decreased cognition;Decreased safety awareness;Decreased knowledge of use of DME or AE;Cardiopulmonary status limiting activity      OT Treatment/Interventions: Self-care/ADL training;Therapeutic exercise;Energy conservation;DME and/or AE instruction;Therapeutic activities;Balance training;Patient/family education    OT Goals(Current goals can be found in the care plan section) Acute Rehab OT Goals OT Goal Formulation: With patient/family Time For Goal Achievement: 06/18/23 Potential to Achieve Goals: Good  OT Frequency: Min 1X/week       AM-PAC OT "6 Clicks" Daily Activity     Outcome Measure Help from another person eating meals?: None Help from another person taking care of personal grooming?: None Help from another person toileting, which includes using toliet, bedpan, or urinal?: A Lot Help from another person bathing (including washing, rinsing, drying)?: A Little Help from another person to put on and taking off regular upper body clothing?: A Little Help from another person to put on and taking off regular lower body clothing?: A Little 6 Click Score: 19   End of Session Equipment Utilized During Treatment: Gait belt;Rolling walker (2 wheels) Nurse Communication: Mobility status;Other (comment) (VS)  Activity Tolerance: Other (comment) (Limited by BP) Patient left: with family/visitor present;with bed alarm set;with call bell/phone within reach;in bed  OT Visit Diagnosis: Unsteadiness on feet (R26.81);Repeated falls (R29.6);Muscle weakness (generalized) (M62.81)  Time: 1345-1414 OT Time Calculation (min): 29 min Charges:  OT General Charges $OT Visit: 1 Visit OT Evaluation $OT Eval Low Complexity: 1  Low OT Treatments $Therapeutic Activity: 8-22 mins  Leona Pressly L. Avelynn Sellin, OTR/L  06/04/23, 3:37 PM   Lise Auer Jachin Coury 06/04/2023, 3:29 PM

## 2023-06-04 NOTE — Evaluation (Signed)
Physical Therapy Evaluation Patient Details Name: Tricia Edwards MRN: [id number] DOB: 1930/08/16 Today's Date: 06/04/2023  History of Present Illness  Patient is a 89 year old female with bilateral lower extremity weakness that has been present since recent discharge from the hospital. History of CHF, pneumonia, atrial fibrillation, hypertension, orthostasis   Clinical Impression  Patient is agreeable to PT evaluation. Supportive daughter (caregiver) at bedside. The patient lives with her daughter and is able to ambulate a household distance with occasional hand held assistance, otherwise no device used at baseline. Extensive DME at home if needed.  The patient had positive orthostatic hypotension during session today. Two standing bouts performed with dizziness with each standing bout. Unable to progress ambulation due to dizziness with standing and knee buckling. Anticipate mobility will improve when blood pressure is improved with standing. Recommend to continue PT to maximize independence and decrease caregiver burden.   Orthostatic VS during PT session:  BP- Lying Pulse- Lying BP- Sitting Pulse- Sitting BP- Standing at 0 minutes ** Pulse- Standing at 0 minutes  06/04/23 0845 146/66 62 117/78 65 (!) 85/70 64                                     **taken immediately after standing as  patient unable to stand long enough to get reading        Assistance Recommended at Discharge Intermittent Supervision/Assistance  If plan is discharge home, recommend the following:  Can travel by private vehicle  A little help with walking and/or transfers;A little help with bathing/dressing/bathroom;Assistance with cooking/housework;Help with stairs or ramp for entrance;Assist for transportation        Equipment Recommendations None recommended by PT  Recommendations for Other Services       Functional Status Assessment Patient has had a recent decline in their functional status and demonstrates  the ability to make significant improvements in function in a reasonable and predictable amount of time.     Precautions / Restrictions Precautions Precautions: Fall Precaution Comments: + orthostatic Restrictions Weight Bearing Restrictions: No      Mobility  Bed Mobility Overal bed mobility: Needs Assistance Bed Mobility: Sit to Supine, Supine to Sit     Supine to sit: Supervision, HOB elevated Sit to supine: Min guard, HOB elevated   General bed mobility comments: increased time required    Transfers Overall transfer level: Needs assistance Equipment used: 1 person hand held assist Transfers: Sit to/from Stand Sit to Stand: Min guard, Min assist           General transfer comment: Min guard with first standing bout. Min A with second standing bout. patient has dizziness with standing with + orthostatic vitals    Ambulation/Gait               General Gait Details: unsafe/unable to attempt due to + orthostatic vitals with dizziness and knee buckling with standing  Stairs            Wheelchair Mobility     Tilt Bed    Modified Rankin (Stroke Patients Only)       Balance Overall balance assessment: Needs assistance Sitting-balance support: Feet supported Sitting balance-Leahy Scale: Good     Standing balance support: Single extremity supported Standing balance-Leahy Scale: Fair  Pertinent Vitals/Pain Pain Assessment Pain Assessment: No/denies pain    Home Living Family/patient expects to be discharged to:: Private residence Living Arrangements: Children (daughter) Available Help at Discharge: Family;Available 24 hours/day Type of Home: House Home Access: Level entry       Home Layout: One level Home Equipment: Agricultural consultant (2 wheels);Rollator (4 wheels);Cane - single point;Shower seat;Wheelchair - manual Additional Comments: [place] at home    Prior Function Prior Level of Function :  Independent/Modified Independent             Mobility Comments: patient ambulates a household distance independently and without device. supervision provided from the daughter with occasional hand held assistance       Hand Dominance        Extremity/Trunk Assessment   Upper Extremity Assessment Upper Extremity Assessment: Generalized weakness    Lower Extremity Assessment Lower Extremity Assessment: Generalized weakness       Communication   Communication: No difficulties  Cognition Arousal/Alertness: Awake/alert Behavior During Therapy: WFL for tasks assessed/performed Overall Cognitive Status: Within Functional Limits for tasks assessed                                          General Comments General comments (skin integrity, edema, etc.): supportive daughter present throughout session    Exercises     Assessment/Plan    PT Assessment Patient needs continued PT services  PT Problem List Decreased strength;Decreased range of motion;Decreased activity tolerance;Decreased balance;Decreased mobility;Decreased safety awareness       PT Treatment Interventions DME instruction;Gait training;Stair training;Functional mobility training;Therapeutic exercise;Therapeutic activities;Balance training;Neuromuscular re-education;Patient/family education    PT Goals (Current goals can be found in the Care Plan section)  Acute Rehab PT Goals Patient Stated Goal: to go home PT Goal Formulation: With patient/family Time For Goal Achievement: 06/18/23 Potential to Achieve Goals: Fair    Frequency Min 1X/week     Co-evaluation               AM-PAC PT "6 Clicks" Mobility  Outcome Measure Help needed turning from your back to your side while in a flat bed without using bedrails?: None Help needed moving from lying on your back to sitting on the side of a flat bed without using bedrails?: A Little Help needed moving to and from a bed to a chair  (including a wheelchair)?: A Little Help needed standing up from a chair using your arms (e.g., wheelchair or bedside chair)?: A Little Help needed to walk in hospital room?: A Little Help needed climbing 3-5 steps with a railing? : A Little 6 Click Score: 19    End of Session   Activity Tolerance:  (limited by dizziness in standing) Patient left: in bed;with call bell/phone within reach;with bed alarm set;with family/visitor present Nurse Communication: Mobility status (blood pressure, daughter requesting her daily medications) PT Visit Diagnosis: Unsteadiness on feet (R26.81);Muscle weakness (generalized) (M62.81)    Time: 4098-1191 PT Time Calculation (min) (ACUTE ONLY): 28 min   Charges:   PT Evaluation $PT Eval Low Complexity: 1 Low PT Treatments $Therapeutic Activity: 8-22 mins PT General Charges $$ ACUTE PT VISIT: 1 Visit         Donna Bernard, PT, MPT  [employer] 06/04/2023, 9:25 AM

## 2023-06-04 NOTE — TOC Initial Note (Signed)
Transition of Care [facility]) - Initial/Assessment Note    Patient Details  Name: Tricia Edwards MRN: [id number] Date of Birth: 10/21/1930  [facility] CM/SW Contact:    Tricia Katz, LCSW Phone Number: 06/04/2023, 11:11 AM  Clinical Narrative:   CSW spoke with patient and daughter about [facility] services. Pt and daughter not interested as patient has a procedure in [place] next week and will not be home to obtain services. Family reports they have all needed DME and do not anticipate any DME needs. Daughter Tricia Edwards states she is with her mom 24/7 and assists with her care and appointments. Pt is active with cardiology in [place] as well as remote [facility] here in [place]. Daughter had some concerns with patients meds and wanted to speak to MD. Message relayed to Dr. Myriam Forehand.                  Patient Goals and CMS Choice            Expected Discharge Plan and Services                                              Prior Living Arrangements/Services                       Activities of Daily Living Home Assistive Devices/Equipment: Dan Humphreys (specify type) ADL Screening (condition at time of admission) Patient's cognitive ability adequate to safely complete daily activities?: Yes Is the patient deaf or have difficulty hearing?: Yes Does the patient have difficulty seeing, even when wearing glasses/contacts?: No Does the patient have difficulty concentrating, remembering, or making decisions?: No Patient able to express need for assistance with ADLs?: Yes Does the patient have difficulty dressing or bathing?: No Independently performs ADLs?: Yes (appropriate for developmental age) Does the patient have difficulty walking or climbing stairs?: Yes Weakness of Legs: Both Weakness of Arms/Hands: None  Permission Sought/Granted                  Emotional Assessment              Admission diagnosis:  Dehydration [E86.0] Lower urinary tract infectious  disease [N39.0] Weakness [R53.1] Lower extremity weakness [R29.898] Anemia, unspecified type [D64.9] Patient Active Problem List   Diagnosis Date Noted   Lower extremity weakness 06/03/2023   AKI (acute kidney injury) (HCC) 06/03/2023   Symptomatic anemia 05/31/2023   Atrial fibrillation, chronic (HCC) 05/31/2023   HTN (hypertension) 05/31/2023   Pneumonia 11/03/2022   Acute respiratory failure with hypoxia (HCC) 11/02/2022   Persistent atrial fibrillation (HCC) 10/31/2022   SOB (shortness of breath) 10/31/2022   Acute on chronic diastolic CHF (congestive heart failure) (HCC) 05/23/2022   Atrial fibrillation with RVR (HCC) 05/22/2022   Chronic kidney disease, stage 3a (HCC) 05/22/2022   Iron deficiency anemia 05/22/2022   Syncope 05/22/2022   Elevated troponin    UTI (urinary tract infection) 01/08/2022   Hepatic steatosis 12/04/2021   Chronic idiopathic constipation 08/21/2021   Chronic diastolic congestive heart failure (HCC) 05/25/2021   Paroxysmal atrial fibrillation (HCC) 05/25/2021   Hypokalemia 01/18/2021   Chronic orthostatic hypotension 01/18/2021   History of trigeminal neuralgia 01/18/2021   Dizziness 08/08/2020   Osteoporosis 12/30/2018   Vitamin D deficiency 12/22/2018   Syncope and collapse 03/15/2018   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/09/2018   Unspecified abdominal hernia  without obstruction or gangrene 05/05/2017   Gastro-esophageal reflux disease with esophagitis 03/10/2017   Anemia, unspecified 02/06/2017   Macular degeneration, dry 01/01/2017   Pseudophakia of right eye 01/01/2017   Vitamin B12 deficiency 01/01/2017   Hyponatremia 08/22/2016   Trigeminal neuralgia of left side of face 03/24/2016   Radiculopathy, cervical region 03/24/2016   Atherosclerotic heart disease of native coronary artery without angina pectoris 11/08/2015   Stage 3 chronic kidney disease (HCC) 10/29/2013   Spondylosis without myelopathy or radiculopathy, site unspecified  06/28/2013   Nuclear senile cataract 01/07/2013   Obesity 08/09/2010   Benign paroxysmal positional vertigo 10/18/2008   Other dorsalgia 12/04/2005   Hyperlipidemia 09/23/2004   PCP:  [facility] Pharmacy:   [facility] [address] [place], [place] - [address] [place] [address] Phone: [phone] Fax: [phone]     Social Determinants of Health (SDOH) Social History: SDOH Screenings   Food Insecurity: Patient Declined (06/03/2023)  Housing: Patient Declined (06/03/2023)  Transportation Needs: Patient Declined (06/03/2023)  Utilities: Patient Declined (06/03/2023)  Alcohol Screen: Low Risk  (04/29/2023)  Depression (PHQ2-9): Low Risk  (04/29/2023)  Financial Resource Strain: Not on File (12/24/2019)   Received from [place], [place]  Physical Activity: Not on File (12/24/2019)   Received from [place], [place]  Social Connections: Not on File (12/24/2019)   Received from [place], [place]  Stress: Not on File (12/24/2019)   Received from [place], [place]  Tobacco Use: Medium Risk (06/03/2023)   SDOH Interventions:     Readmission Risk Interventions     No data to display

## 2023-06-05 DIAGNOSIS — N390 Urinary tract infection, site not specified: Secondary | ICD-10-CM | POA: Diagnosis not present

## 2023-06-05 DIAGNOSIS — R29898 Other symptoms and signs involving the musculoskeletal system: Secondary | ICD-10-CM | POA: Diagnosis not present

## 2023-06-05 DIAGNOSIS — N179 Acute kidney failure, unspecified: Secondary | ICD-10-CM | POA: Diagnosis not present

## 2023-06-05 LAB — BASIC METABOLIC PANEL
Anion gap: 8 (ref 5–15)
BUN: 20 mg/dL (ref 8–23)
CO2: 28 mmol/L (ref 22–32)
Calcium: 8.1 mg/dL — ABNORMAL LOW (ref 8.9–10.3)
Chloride: 104 mmol/L (ref 98–111)
Creatinine, Ser: 1.08 mg/dL — ABNORMAL HIGH (ref 0.44–1.00)
GFR, Estimated: 49 mL/min — ABNORMAL LOW (ref >60)
Glucose, Bld: 86 mg/dL (ref 70–99)
Potassium: 3.4 mmol/L — ABNORMAL LOW (ref 3.5–5.1)
Sodium: 140 mmol/L (ref 135–145)

## 2023-06-05 LAB — URINE CULTURE: Culture: <10000 — AB

## 2023-06-05 LAB — MAGNESIUM: Magnesium: 2.2 mg/dL (ref 1.7–2.4)

## 2023-06-05 MED ORDER — CEPHALEXIN 500 MG PO CAPS: 500.0000 mg | ORAL_CAPSULE | Freq: Three times a day (TID) | ORAL | 0 refills | Status: AC

## 2023-06-05 MED ORDER — CEPHALEXIN 500 MG PO CAPS
500.0000 mg | ORAL_CAPSULE | Freq: Three times a day (TID) | ORAL | Status: DC
  Administered 2023-06-05: 500 mg via ORAL
  Filled 2023-06-05: qty 1

## 2023-06-05 NOTE — Discharge Summary (Signed)
Physician Discharge Summary   Patient: Tricia Edwards MRN: [id number] DOB: 1930-05-04  Admit date:     06/03/2023  Discharge date: 06/05/23  Discharge Physician: Lurene Shadow   PCP: [facility]   Recommendations at discharge:   Follow-up with PCP in 1 week   Discharge Diagnoses: Principal Problem:   Lower extremity weakness Active Problems:   AKI (acute kidney injury) (HCC)   Chronic orthostatic hypotension   Chronic diastolic congestive heart failure (HCC)   Iron deficiency anemia   Trigeminal neuralgia of left side of face   Paroxysmal atrial fibrillation (HCC)   Acute UTI  Resolved Problems:   * No resolved hospital problems. *  Hospital Course:  Tricia Edwards is a 89 y.o. female with medical history significant of A-fib on current Eliquis holiday, chronic diastolic heart failure, hypertension, hyperlipidemia, COPD, CKD stage IIIa, recent GI bleed with associated anemia, orthostatic hypotension, trigeminal neuralgia, discharged from the hospital on 06/02/2023 for CHF exacerbation and symptomatic anemia requiring  unit of blood transfusion.  She presented to the ED hospital with bilateral lower extremity weakness.    Assessment and Plan:   Bilateral extremity weakness: No focal deficits.  PT recommended home health therapy.  His daughter declines home health therapy because patient will be traveling to [facility] after discharge from the hospital.     AKI: Improved and creatinine is around baseline.     Hypokalemia: Improved.  Continue potassium repletion at discharge.     Probable acute UTI: No significant growth on urine culture.  She will be discharged on Keflex to complete 5 days of treatment.       Chronic orthostatic hypotension: Continue midodrine and fludrocortisone.      Trigeminal neuralgia on the left side of the face: Continue oxcarbazepine gabapentin.  Patient is scheduled to have a procedure in [place] on June 11, 2023.     Chronic  diastolic CHF: Compensated.     Anemia of chronic disease, iron deficiency anemia: Continue ferrous sulfate at discharge. S/p recent transfusion with 1 units of PRBCs on 06/01/2023     Paroxysmal atrial fibrillation: Continue amiodarone.  His daughter said patient was previously instructed to resume Eliquis on 06/06/2023.  She plans to see Dr. Mariah Milling, cardiologist, on 06/06/2023 for further recommendations.  She also understands that Eliquis may have to be held for upcoming procedure and she said they have been talking to the physician in [place].      Her condition has improved and she is deemed stable for discharge to home today.  Discharge plan was discussed with Tricia Edwards, daughter, at the bedside.       Consultants: None Procedures performed: None  Disposition: Home Diet recommendation:  Discharge Diet Orders (From admission, onward)     Start     Ordered   06/05/23 0000  Diet - low sodium heart healthy        06/05/23 1147           Cardiac diet DISCHARGE MEDICATION: Allergies as of 06/05/2023       Reactions   Pneumovax [pneumococcal Polysaccharide Vaccine]    Other reaction(s): Severe arm swelling   Pneumococcal Vaccine    Other reaction(s): Unknown        Medication List     TAKE these medications    amiodarone 200 MG tablet Commonly known as: PACERONE Take 1 tablet (200 mg total) by mouth daily.   atorvastatin 10 MG tablet Commonly known as: LIPITOR Take 10 mg by mouth daily.  CALCIUM 600/VITAMIN D PO Take 1 tablet by mouth in the morning and at bedtime.   cephALEXin 500 MG capsule Commonly known as: KEFLEX Take 1 capsule (500 mg total) by mouth every 8 (eight) hours for 3 days.   Eliquis 5 MG Tabs tablet Generic drug: apixaban Take 5 mg by mouth 2 (two) times daily.   ferrous sulfate 324 MG Tbec Take 324 mg by mouth 3 (three) times a week. Take 1 tablet every morning, and 1 tablet in the evening every Wednesday and Saturday   fludrocortisone  0.1 MG tablet Commonly known as: FLORINEF Take 2 tablets (200 mcg total) by mouth 2 (two) times daily.   gabapentin 300 MG capsule Commonly known as: NEURONTIN Take 300-600 mg by mouth 3 (three) times daily.   midodrine 10 MG tablet Commonly known as: PROAMATINE Take 10 mg by mouth 3 (three) times daily.   MiraLax 17 GM/SCOOP powder Generic drug: polyethylene glycol powder Take 17 g by mouth as needed for mild constipation.   omeprazole 20 MG capsule Commonly known as: PRILOSEC Take 1 capsule (20 mg total) by mouth 2 (two) times daily before a meal.   Oxcarbazepine 300 MG tablet Commonly known as: TRILEPTAL Take 300 mg by mouth 2 (two) times daily.   potassium chloride SA 20 MEQ tablet Commonly known as: KLOR-CON M Take 20 mEq by mouth daily.   senna 8.6 MG Tabs tablet Commonly known as: SENOKOT Take 1 tablet by mouth daily as needed for mild constipation.        Discharge Exam: Filed Weights   06/03/23 1201 06/04/23 0432  Weight: 76.2 kg 77.1 kg   GEN: NAD SKIN: Warm and dry EYES: No pallor or icterus ENT: MMM CV: RRR PULM: CTA B ABD: soft, ND, NT, +BS CNS: AAO x 3, non focal EXT: No edema or tenderness   Condition at discharge: good  The results of significant diagnostics from this hospitalization (including imaging, microbiology, ancillary and laboratory) are listed below for reference.   Imaging Studies: DG Chest Port 1 View  Result Date: 06/03/2023 CLINICAL DATA:  Persistent weakness. Recently discharged from hospital. COPD. Atrial fibrillation. Hypertension. EXAM: PORTABLE CHEST 1 VIEW COMPARISON:  05/31/2023 FINDINGS: Single AP view of the chest with minimal exclusion of the left costophrenic angle. Patient rotated to the right. Mild cardiomegaly. Atherosclerosis in the transverse aorta. Tortuous thoracic aorta. No large pleural effusions. Pulmonary interstitial thickening is similar, given differences in technique. No overt congestive failure or  lobar consolidation. IMPRESSION: Cardiomegaly and mild interstitial thickening which could represent pulmonary venous congestion or be the sequelae of chronic bronchitis/COPD. No overt congestive failure or lobar consolidation. Aortic Atherosclerosis (ICD10-I70.0). Electronically Signed   By: Jeronimo Greaves M.D.   On: 06/03/2023 13:43   ECHOCARDIOGRAM COMPLETE  Result Date: 06/02/2023    ECHOCARDIOGRAM REPORT   Patient Name:   THELIA Herberger Date of Exam: 06/01/2023 Medical Rec #:  [id number]       Height:       67.0 in Accession #:    [id number]      Weight:       172.0 lb Date of Birth:  [birth year]       BSA:          1.897 m Patient Age:    89 years        BP:           99/53 mmHg Patient Gender: F  HR:           67 bpm. Exam Location:  [facility] Procedure: 2D Echo Indications:     CHF I50.31  History:         Patient has prior history of Echocardiogram examinations, most                  recent 01/18/2021.  Sonographer:     Overton Mam RDCS, FASE Referring Phys:  [id number] Brien Few NIU Diagnosing Phys: Lorine Bears MD IMPRESSIONS  1. Left ventricular ejection fraction, by estimation, is 60 to 65%. The left ventricle has normal function. The left ventricle has no regional wall motion abnormalities. There is mild left ventricular hypertrophy. Left ventricular diastolic parameters are indeterminate.  2. Right ventricular systolic function is normal. The right ventricular size is normal. There is normal pulmonary artery systolic pressure.  3. Left atrial size was moderately dilated.  4. The mitral valve is normal in structure. Mild to moderate mitral valve regurgitation. No evidence of mitral stenosis.  5. The aortic valve is normal in structure. Aortic valve regurgitation is not visualized. Mild aortic valve stenosis. Aortic valve area, by VTI measures 1.99 cm. Aortic valve mean gradient measures 8.0 mmHg.  6. The inferior vena cava is normal in size with greater than 50% respiratory variability, suggesting  right atrial pressure of 3 mmHg. FINDINGS  Left Ventricle: Left ventricular ejection fraction, by estimation, is 60 to 65%. The left ventricle has normal function. The left ventricle has no regional wall motion abnormalities. The left ventricular internal cavity size was normal in size. There is  mild left ventricular hypertrophy. Left ventricular diastolic parameters are indeterminate. Right Ventricle: The right ventricular size is normal. No increase in right ventricular wall thickness. Right ventricular systolic function is normal. There is normal pulmonary artery systolic pressure. The tricuspid regurgitant velocity is 2.78 m/s, and  with an assumed right atrial pressure of 3 mmHg, the estimated right ventricular systolic pressure is 33.9 mmHg. Left Atrium: Left atrial size was moderately dilated. Right Atrium: Right atrial size was normal in size. Pericardium: There is no evidence of pericardial effusion. Mitral Valve: The mitral valve is normal in structure. Mild to moderate mitral valve regurgitation. No evidence of mitral valve stenosis. Tricuspid Valve: The tricuspid valve is normal in structure. Tricuspid valve regurgitation is trivial. No evidence of tricuspid stenosis. Aortic Valve: The aortic valve is normal in structure. Aortic valve regurgitation is not visualized. Mild aortic stenosis is present. Aortic valve mean gradient measures 8.0 mmHg. Aortic valve peak gradient measures 15.9 mmHg. Aortic valve area, by VTI measures 1.99 cm. Pulmonic Valve: The pulmonic valve was normal in structure. Pulmonic valve regurgitation is not visualized. No evidence of pulmonic stenosis. Aorta: The aortic root is normal in size and structure. Venous: The inferior vena cava is normal in size with greater than 50% respiratory variability, suggesting right atrial pressure of 3 mmHg. IAS/Shunts: No atrial level shunt detected by color flow Doppler.  LEFT VENTRICLE PLAX 2D LVIDd:         5.00 cm   Diastology LVIDs:          3.30 cm   LV e' medial:    6.64 cm/s LV PW:         1.50 cm   LV E/e' medial:  15.5 LV IVS:        1.20 cm   LV e' lateral:   9.25 cm/s LVOT diam:     1.80 cm   LV E/e'  lateral: 11.1 LV SV:         91 LV SV Index:   48 LVOT Area:     2.54 cm  RIGHT VENTRICLE RV Basal diam:  3.00 cm RV S prime:     15.40 cm/s TAPSE (M-mode): 2.3 cm LEFT ATRIUM             Index        RIGHT ATRIUM           Index LA diam:        5.00 cm 2.64 cm/m   RA Area:     14.00 cm LA Vol (A2C):   78.9 ml 41.60 ml/m  RA Volume:   35.70 ml  18.82 ml/m LA Vol (A4C):   73.8 ml 38.91 ml/m LA Biplane Vol: 78.5 ml 41.39 ml/m  AORTIC VALVE                     PULMONIC VALVE AV Area (Vmax):    1.95 cm      PV Vmax:       1.12 m/s AV Area (Vmean):   2.12 cm      PV Peak grad:  5.0 mmHg AV Area (VTI):     1.99 cm AV Vmax:           199.50 cm/s AV Vmean:          125.000 cm/s AV VTI:            0.457 m AV Peak Grad:      15.9 mmHg AV Mean Grad:      8.0 mmHg LVOT Vmax:         153.00 cm/s LVOT Vmean:        104.000 cm/s LVOT VTI:          0.357 m LVOT/AV VTI ratio: 0.78  AORTA Ao Root diam: 3.20 cm Ao Asc diam:  2.80 cm MITRAL VALVE                TRICUSPID VALVE MV Area (PHT): 2.53 cm     TR Peak grad:   30.9 mmHg MV Decel Time: 300 msec     TR Vmax:        278.00 cm/s MV E velocity: 103.00 cm/s MV A velocity: 73.90 cm/s   SHUNTS MV E/A ratio:  1.39         Systemic VTI:  0.36 m                             Systemic Diam: 1.80 cm Lorine Bears MD Electronically signed by Lorine Bears MD Signature Date/Time: 06/02/2023/7:18:38 AM    Final    DG Chest 2 View  Result Date: 05/31/2023 CLINICAL DATA:  Shortness of breath EXAM: CHEST - 2 VIEW COMPARISON:  11/02/2022 FINDINGS: Stable cardiomegaly. Aortic atherosclerosis. Bilateral interstitial opacities most pronounced within the perihilar and bibasilar regions. Trace bilateral pleural effusions. No pneumothorax. IMPRESSION: Appearance suggests CHF with mild pulmonary edema. Electronically  Signed   By: Duanne Guess D.O.   On: 05/31/2023 09:44    Microbiology: Results for orders placed or performed during the hospital encounter of 06/03/23  Urine Culture     Status: Abnormal   Collection Time: 06/03/23  2:48 PM   Specimen: Urine, Clean Catch  Result Value Ref Range Status   Specimen Description   Final    URINE, CLEAN CATCH Performed at [facility],  [address], [place], [place] [address]    Special Requests   Final    NONE Performed at [facility], [address] [place], [place] [address]    Culture (A)  Final    <10,000 COLONIES/mL INSIGNIFICANT GROWTH Performed at [facility], [address], [place], [place] [address]    Report Status 06/05/2023 FINAL  Final    Labs: CBC: Recent Labs  Lab 05/31/23 0844 05/31/23 1617 05/31/23 2218 06/01/23 0458 06/02/23 0806 06/03/23 1210 06/04/23 0454  WBC 5.6   < > 5.0 4.3 5.6 5.8 5.2  NEUTROABS 3.7  --   --   --  3.7  --   --   HGB 6.2*   < > 7.5* 7.2* 9.5* 8.9* 8.9*  HCT 21.0*   < > 24.8* 23.7* 31.1* 29.7* 28.0*  MCV 102.4*   < > 98.8 99.2 96.6 99.3 96.2  PLT 269   < > 262 246 265 251 248   < > = values in this interval not displayed.   Basic Metabolic Panel: Recent Labs  Lab 05/31/23 0844 06/01/23 0458 06/02/23 0806 06/03/23 1210 06/04/23 0454 06/05/23 0742  NA 142 145 144 140 140 140  K 3.2* 3.2* 3.0* 3.4* 3.3* 3.4*  CL 104 103 101 99 103 104  CO2 29 34* 34* 32 31 28  GLUCOSE 106* 91 99 79 91 86  BUN 20 18 22  31* 22 20  CREATININE 1.09* 1.16* 1.39* 1.48* 1.24* 1.08*  CALCIUM 8.1* 7.5* 8.3* 7.9* 8.0* 8.1*  MG 2.1 1.8  --   --   --  2.2   Liver Function Tests: Recent Labs  Lab 06/03/23 1210  AST 15  ALT 10  ALKPHOS 104  BILITOT 0.4  PROT 6.3*  ALBUMIN 2.9*   CBG: Recent Labs  Lab 06/01/23 0806  GLUCAP 82    Discharge time spent: greater than 30 minutes.  Signed: Lurene Shadow, MD [facility] 06/05/2023

## 2023-06-09 ENCOUNTER — Encounter: Payer: Medicare Other | Admitting: Family

## 2023-06-18 ENCOUNTER — Encounter: Payer: Self-pay | Admitting: Radiation Therapy

## 2023-06-18 NOTE — Progress Notes (Signed)
Pt decided to be treated in [place].   Jalene Mullet R.T.(R)(T) Radiation Special Procedures Navigator

## 2023-08-07 ENCOUNTER — Telehealth: Payer: Self-pay | Admitting: Gastroenterology

## 2023-08-07 ENCOUNTER — Emergency Department
Admission: EM | Admit: 2023-08-07 | Discharge: 2023-08-07 | Disposition: A | Discharge status: Home / Self Care | Payer: Medicare Other | Attending: Emergency Medicine | Admitting: Emergency Medicine

## 2023-08-07 ENCOUNTER — Other Ambulatory Visit: Payer: Self-pay

## 2023-08-07 DIAGNOSIS — R300 Dysuria: Secondary | ICD-10-CM | POA: Insufficient documentation

## 2023-08-07 DIAGNOSIS — K922 Gastrointestinal hemorrhage, unspecified: Secondary | ICD-10-CM | POA: Diagnosis not present

## 2023-08-07 DIAGNOSIS — J449 Chronic obstructive pulmonary disease, unspecified: Secondary | ICD-10-CM | POA: Diagnosis not present

## 2023-08-07 DIAGNOSIS — R195 Other fecal abnormalities: Secondary | ICD-10-CM | POA: Diagnosis present

## 2023-08-07 DIAGNOSIS — Z7901 Long term (current) use of anticoagulants: Secondary | ICD-10-CM | POA: Diagnosis not present

## 2023-08-07 DIAGNOSIS — I10 Essential (primary) hypertension: Secondary | ICD-10-CM | POA: Insufficient documentation

## 2023-08-07 LAB — URINALYSIS, ROUTINE W REFLEX MICROSCOPIC
Bilirubin Urine: NEGATIVE
Glucose, UA: NEGATIVE mg/dL
Hgb urine dipstick: NEGATIVE
Ketones, ur: NEGATIVE mg/dL
Leukocytes,Ua: NEGATIVE
Nitrite: NEGATIVE
Protein, ur: NEGATIVE mg/dL
Specific Gravity, Urine: 1.01 (ref 1.005–1.030)
pH: 7 (ref 5.0–8.0)

## 2023-08-07 LAB — CBC
HCT: 31.1 % — ABNORMAL LOW (ref 36.0–46.0)
HCT: 30.1 % — ABNORMAL LOW (ref 36.0–46.0)
Hemoglobin: 9.3 g/dL — ABNORMAL LOW (ref 12.0–15.0)
Hemoglobin: 9.2 g/dL — ABNORMAL LOW (ref 12.0–15.0)
MCH: 29 pg (ref 26.0–34.0)
MCH: 29.6 pg (ref 26.0–34.0)
MCHC: 29.9 g/dL — ABNORMAL LOW (ref 30.0–36.0)
MCHC: 30.6 g/dL (ref 30.0–36.0)
MCV: 96.9 fL (ref 80.0–100.0)
MCV: 96.8 fL (ref 80.0–100.0)
Platelets: 267 10*3/uL (ref 150–400)
Platelets: 257 10*3/uL (ref 150–400)
RBC: 3.21 MIL/uL — ABNORMAL LOW (ref 3.87–5.11)
RBC: 3.11 MIL/uL — ABNORMAL LOW (ref 3.87–5.11)
RDW: 16.5 % — ABNORMAL HIGH (ref 11.5–15.5)
RDW: 16.4 % — ABNORMAL HIGH (ref 11.5–15.5)
WBC: 6.3 10*3/uL (ref 4.0–10.5)
WBC: 6.9 10*3/uL (ref 4.0–10.5)
nRBC: 0 % (ref 0.0–0.2)
nRBC: 0 % (ref 0.0–0.2)

## 2023-08-07 LAB — COMPREHENSIVE METABOLIC PANEL
ALT: 13 U/L (ref 0–44)
AST: 20 U/L (ref 15–41)
Albumin: 3.3 g/dL — ABNORMAL LOW (ref 3.5–5.0)
Alkaline Phosphatase: 100 U/L (ref 38–126)
Anion gap: 8 (ref 5–15)
BUN: 21 mg/dL (ref 8–23)
CO2: 26 mmol/L (ref 22–32)
Calcium: 8.1 mg/dL — ABNORMAL LOW (ref 8.9–10.3)
Chloride: 105 mmol/L (ref 98–111)
Creatinine, Ser: 1.3 mg/dL — ABNORMAL HIGH (ref 0.44–1.00)
GFR, Estimated: 39 mL/min — ABNORMAL LOW (ref >60)
Glucose, Bld: 167 mg/dL — ABNORMAL HIGH (ref 70–99)
Potassium: 3.4 mmol/L — ABNORMAL LOW (ref 3.5–5.1)
Sodium: 139 mmol/L (ref 135–145)
Total Bilirubin: 0.4 mg/dL (ref 0.3–1.2)
Total Protein: 6.5 g/dL (ref 6.5–8.1)

## 2023-08-07 LAB — TYPE AND SCREEN
ABO/RH(D): O POS
Antibody Screen: NEGATIVE

## 2023-08-07 NOTE — ED Provider Notes (Signed)
[facility] Provider Note    Event Date/Time   First MD Initiated Contact with Patient 08/07/23 0935     (approximate)   History   Chief Complaint: Abdominal Pain   HPI  Tricia Edwards is a 89 y.o. female with a history of hypertension, COPD, paroxysmal atrial fibrillation on Eliquis who comes to the ED due to dark stool since last night.  A month ago patient had GI bleeding, was hospitalized with symptomatic anemia, had GI endoscopy which cauterize bleeding vessels, had blood transfusion and had been stable with normal brown stool afterward.  Patient denies chest pain shortness of breath fevers chills dizziness lightheadedness syncope.  Overall feels fine, except for dysuria.     Physical Exam   Triage Vital Signs: ED Triage Vitals  Encounter Vitals Group     BP 08/07/23 0854 (!) 171/66     Systolic BP Percentile --      Diastolic BP Percentile --      Pulse Rate 08/07/23 0854 65     Resp 08/07/23 0854 16     Temp 08/07/23 0854 98.3 F (36.8 C)     Temp Source 08/07/23 0854 Oral     SpO2 08/07/23 0854 97 %     Weight 08/07/23 0853 169 lb 15.6 oz (77.1 kg)     Height --      Head Circumference --      Peak Flow --      Pain Score 08/07/23 0852 0     Pain Loc --      Pain Education --      Exclude from Growth Chart --     Most recent vital signs: Vitals:   08/07/23 1130 08/07/23 1200  BP: (!) 177/88 (!) 181/80  Pulse: (!) 58 61  Resp:  17  Temp:    SpO2:  96%    General: Awake, no distress.  CV:  Good peripheral perfusion.  Resp:  Normal effort.  Abd:  No distention.  Soft nontender.  Rectal exam reveals dark stool, Hemoccult positive Other:  Moist oral mucosa.  No pallor   ED Results / Procedures / Treatments   Labs (all labs ordered are listed, but only abnormal results are displayed) Labs Reviewed  COMPREHENSIVE METABOLIC PANEL - Abnormal; Notable for the following components:      Result Value   Potassium 3.4 (*)     Glucose, Bld 167 (*)    Creatinine, Ser 1.30 (*)    Calcium 8.1 (*)    Albumin 3.3 (*)    GFR, Estimated 39 (*)    All other components within normal limits  CBC - Abnormal; Notable for the following components:   RBC 3.21 (*)    Hemoglobin 9.3 (*)    HCT 31.1 (*)    MCHC 29.9 (*)    RDW 16.5 (*)    All other components within normal limits  URINALYSIS, ROUTINE W REFLEX MICROSCOPIC - Abnormal; Notable for the following components:   Color, Urine YELLOW (*)    APPearance CLEAR (*)    All other components within normal limits  CBC - Abnormal; Notable for the following components:   RBC 3.11 (*)    Hemoglobin 9.2 (*)    HCT 30.1 (*)    RDW 16.4 (*)    All other components within normal limits  POC OCCULT BLOOD, ED  TYPE AND SCREEN     EKG    RADIOLOGY    PROCEDURES:  Procedures  MEDICATIONS ORDERED IN ED: Medications - No data to display   IMPRESSION / MDM / ASSESSMENT AND PLAN / ED COURSE  I reviewed the triage vital signs and the nursing notes.  DDx: Anemia, AKI, electrolyte abnormality, UTI  Patient's presentation is most consistent with acute presentation with potential threat to life or bodily function.  Patient presents with occult GI bleed.  Vital signs are normal, no worrisome symptoms, exam is otherwise benign and reassuring.  Labs are unremarkable, will trend hemoglobin, monitor for any further bloody output.  ----------------------------------------- 1:04 PM on 08/07/2023 ----------------------------------------- Repeat hb stable. No black/bloody BMs in the ED. Will hold eliquis, stable for DC to f/u with GI. Return precautions discussed.      FINAL CLINICAL IMPRESSION(S) / ED DIAGNOSES   Final diagnoses:  Occult GI bleeding     Rx / DC Orders   ED Discharge Orders     None        Note:  This document was prepared using Dragon voice recognition software and may include unintentional dictation errors.   Sharman Cheek,  MD 08/07/23 [phone]

## 2023-08-07 NOTE — ED Notes (Signed)
See triage note, pt with daughter reports abd pain with urination started last night, also reports dark stools started last night. Denies n/v/d. NAD noted.

## 2023-08-07 NOTE — ED Triage Notes (Addendum)
C/O darker stools last night and dark urine in purewick this morning.  Had endoscopy(07/14/23) in [place] which showed GI bleeding.  Patient c/o pressure with urination.  AAOx3.  Skin warm and dry. NAD

## 2023-08-07 NOTE — Telephone Encounter (Signed)
We have never seen patient in the office she is a new patient. She had a EGD done on 07/14/2023 at [facility]. She was seen in the ED today but no GI consulted her.

## 2023-08-07 NOTE — Discharge Instructions (Addendum)
Discontinue Eliquis for now until you can follow up with gastroenterology.

## 2023-08-07 NOTE — Telephone Encounter (Signed)
Patient daughter [name]) called in because her mother had an ED visit they her to give [place] to schedule and office visit. She [place] currently off of her Eliquis they advised her that she shouldn't take it until she seeing [place]. Her daughter stated that she will have to call around because her mother don't need to be off her medicine to long the daughter said that what the doctor informed her.

## 2023-08-10 NOTE — Telephone Encounter (Signed)
Unfortunately, I will not be able to give any recommendation about Eliquis until I see her in the office.  Not sure, if Inetta Fermo could see her sooner. I see that she also has an appointment to see cardiologist, Dr. Mariah Milling.  She could try if cardiology can expedite the appointment   RV

## 2023-08-11 NOTE — Telephone Encounter (Signed)
Daughter states she is not calling cardiology because it is not a cardiology issue because if she began the Eliquis again then she will start bleeding internally and then she will have more GI bleeds but being off the Eliquis increases her risk for stokes. She states she needs to see GI ASAP. Informed her that we have never seen her so we can not give no recommendations without seeing her in the office. Informed her Dr. Allegra Lai said if we had something sooner with the PA then she could see the PA in the office.We did which was 09/11/2023 the daughter took this appointment.

## 2023-08-21 ENCOUNTER — Telehealth: Payer: Self-pay | Admitting: Physician Assistant

## 2023-08-21 NOTE — Telephone Encounter (Signed)
The patient daughter Tricia Edwards) called in to see what's her mother appointment date and I informed her 09/11/2023

## 2023-09-06 ENCOUNTER — Encounter: Payer: Self-pay | Admitting: Emergency Medicine

## 2023-09-06 ENCOUNTER — Emergency Department: Payer: Medicare Other

## 2023-09-06 ENCOUNTER — Other Ambulatory Visit: Payer: Self-pay

## 2023-09-06 ENCOUNTER — Emergency Department
Admission: EM | Admit: 2023-09-06 | Discharge: 2023-09-06 | Disposition: A | Discharge status: Home / Self Care | Payer: Medicare Other | Attending: Emergency Medicine | Admitting: Emergency Medicine

## 2023-09-06 DIAGNOSIS — K1120 Sialoadenitis, unspecified: Secondary | ICD-10-CM | POA: Diagnosis not present

## 2023-09-06 DIAGNOSIS — R220 Localized swelling, mass and lump, head: Secondary | ICD-10-CM | POA: Diagnosis present

## 2023-09-06 DIAGNOSIS — K1121 Acute sialoadenitis: Secondary | ICD-10-CM

## 2023-09-06 LAB — BASIC METABOLIC PANEL
Anion gap: 10 (ref 5–15)
BUN: 19 mg/dL (ref 8–23)
CO2: 29 mmol/L (ref 22–32)
Calcium: 8.9 mg/dL (ref 8.9–10.3)
Chloride: 101 mmol/L (ref 98–111)
Creatinine, Ser: 1.24 mg/dL — ABNORMAL HIGH (ref 0.44–1.00)
GFR, Estimated: 41 mL/min — ABNORMAL LOW (ref >60)
Glucose, Bld: 137 mg/dL — ABNORMAL HIGH (ref 70–99)
Potassium: 3.5 mmol/L (ref 3.5–5.1)
Sodium: 140 mmol/L (ref 135–145)

## 2023-09-06 LAB — CBC
HCT: 37.2 % (ref 36.0–46.0)
Hemoglobin: 11.7 g/dL — ABNORMAL LOW (ref 12.0–15.0)
MCH: 28.9 pg (ref 26.0–34.0)
MCHC: 31.5 g/dL (ref 30.0–36.0)
MCV: 91.9 fL (ref 80.0–100.0)
Platelets: 232 10*3/uL (ref 150–400)
RBC: 4.05 MIL/uL (ref 3.87–5.11)
RDW: 14.7 % (ref 11.5–15.5)
WBC: 8.3 10*3/uL (ref 4.0–10.5)
nRBC: 0 % (ref 0.0–0.2)

## 2023-09-06 MED ORDER — CEPHALEXIN 500 MG PO CAPS: 500.0000 mg | ORAL_CAPSULE | Freq: Two times a day (BID) | ORAL | 0 refills | Status: DC

## 2023-09-06 MED ORDER — IOHEXOL 300 MG/ML  SOLN
75.0000 mL | Freq: Once | INTRAMUSCULAR | Status: AC | PRN
  Administered 2023-09-06: 75 mL via INTRAVENOUS

## 2023-09-06 NOTE — ED Triage Notes (Signed)
Pt via POV from home. Per daughter, pt had a recent biopsy done on her nose. States that daughter this morning noticed some swelling in the R side of her neck. Reports that she was recently dx with bronchitis 3 weeks ago and completed all the medications but couple of days ago reports increased cough. Denies CP/SOB. Pt is A&OX4 and NAD

## 2023-09-06 NOTE — ED Provider Notes (Signed)
[facility] Provider Note    Event Date/Time   First MD Initiated Contact with Patient 09/06/23 [phone]     (approximate)   History   Facial swelling   HPI  Tricia Edwards is a 89 y.o. female who presents with complaints of right facial swelling.  She noticed this this morning.  Recently had biopsies for lesions on her nose.  No fevers reported.  No intraoral swelling.  Painful swelling at the right angle of the jaw     Physical Exam   Triage Vital Signs: ED Triage Vitals  Encounter Vitals Group     BP 09/06/23 0832 (!) 139/58     Systolic BP Percentile --      Diastolic BP Percentile --      Pulse Rate 09/06/23 0832 61     Resp 09/06/23 0832 18     Temp 09/06/23 0832 98.4 F (36.9 C)     Temp Source 09/06/23 0832 Oral     SpO2 09/06/23 0832 96 %     Weight 09/06/23 0830 77.1 kg (170 lb)     Height 09/06/23 0830 1.676 m (5\' 6" )     Head Circumference --      Peak Flow --      Pain Score --      Pain Loc --      Pain Education --      Exclude from Growth Chart --     Most recent vital signs: Vitals:   09/06/23 1100 09/06/23 1130  BP: (!) 174/69 (!) 153/68  Pulse: (!) 58 62  Resp:    Temp:    SpO2: 96% 100%     General: Awake, no distress.  CV:  Good peripheral perfusion.  Resp:  Normal effort.  Abd:  No distention.  Other:  Mild tenderness, mild swelling right angle of the jaw   ED Results / Procedures / Treatments   Labs (all labs ordered are listed, but only abnormal results are displayed) Labs Reviewed  CBC - Abnormal; Notable for the following components:      Result Value   Hemoglobin 11.7 (*)    All other components within normal limits  BASIC METABOLIC PANEL - Abnormal; Notable for the following components:   Glucose, Bld 137 (*)    Creatinine, Ser 1.24 (*)    GFR, Estimated 41 (*)    All other components within normal limits     EKG     RADIOLOGY CT maxillofacial with IV  contrast    PROCEDURES:  Critical Care performed:   Procedures   MEDICATIONS ORDERED IN ED: Medications  iohexol (OMNIPAQUE) 300 MG/ML solution 75 mL (75 mLs Intravenous Contrast Given 09/06/23 0954)     IMPRESSION / MDM / ASSESSMENT AND PLAN / ED COURSE  I reviewed the triage vital signs and the nursing notes. Patient's presentation is most consistent with acute presentation with potential threat to life or bodily function.  Patient presents with right facial swelling, differential includes odontogenic abscess, sialoadenitis, lymphadenopathy  Lab work reviewed and is overall reassuring, patient is afebrile and nontoxic, pending CT scan of the face  CT scan consistent with unilateral parotid gland swelling, discussed with Dr. Willeen Cass of ENT who recommends antibiotics, hard candies, outpatient follow-up as needed      FINAL CLINICAL IMPRESSION(S) / ED DIAGNOSES   Final diagnoses:  Parotitis, acute     Rx / DC Orders   ED Discharge Orders  Ordered    cephALEXin (KEFLEX) 500 MG capsule  2 times daily        09/06/23 1125             Note:  This document was prepared using Dragon voice recognition software and may include unintentional dictation errors.   Jene Every, MD 09/06/23 [phone]

## 2023-09-06 NOTE — ED Notes (Signed)
Pt A&O x4, no obvious distress noted, respirations regular/unlabored. Pt verbalizes understanding of discharge instructions. Pt able to ambulate from ED independently.   

## 2023-09-10 NOTE — Progress Notes (Signed)
Celso Amy, PA-C [address]  [address]  [place], [place] [address]  Main: [phone]  Fax: [phone]   Gastroenterology Consultation  Referring Provider:     [facility] Primary Care Physician:  [facility] Primary Gastroenterologist:  Celso Amy, PA-C / Dr. Lannette Donath   Reason for Consultation:     F/U Dark Stool and Duodenal GI Bleed        HPI:   Tricia Edwards is a 89 y.o. y/o female referred for consultation & management  by [facility].    Medical history significant for hypertension, COPD, Parox A-fib on Eliquis, CHF, CKD 3.  Former smoker for 33 years.  New to our office.  He is here today with her daughter.  EGD 07/15/23 at [facility] in [place], [place].  Was admitted to hospital for in [place] 07/11/23 - 07/17/23 for duodenal GI bleed and acute on chronic anemia.  #Acute on Chronic Blood Loss Anemia #Upper GI bleeding, due to duodenal angioectasia #Hx of Iron Deficiency Anemia and Anemia of Chronic Disease Presented with worsening shortness of breath fatigue, found to have worsening anemia. H/H was 6.4/21.2 at admission. Patient received 2 units PRBC transfusion.  Status post EGD 07/15/23 which showed non-bleeding angioectasias in the duodenum. Treated with argon plasma coagulation (APC).Clip (MR conditional) was placed. Clip manufacturer: [employer]. Hemoglobin stable for now, Eliquis restarted. hemoglobin up to 9.0 at time of discharge, close to her baseline. Hemodynamically stable. Continue PPI.   Colonoscopy 02/2017 (to eval Anemia): 1 tiny polyp from cecum  Currently taking iron tablet once daily and Prilosec 20 mg daily.  She has been off Eliquis for the past 3 weeks.  Most recent lab 09/06/23 showed stable improved hemoglobin 11.7.  Lab 07/12/2023 showed normal vitamin B12, folate, iron and ferritin.  Current symptoms: 08/07/2023 went back to [facility] ED with dark stools.  Hemoccult positive.  Eliquis  was held.  She was told to follow-up with GI.  She has been off Eliquis for 3 weeks.  Stools have been normal brown color for the past week.  She denies abdominal pain.  Has chronic constipation.  Take stool softener, senna, and MiraLAX with benefit.  No NSAID use.  Takes Tylenol as needed.  She has history of paroxysmal atrial fibrillation.  Appointment with Dr. Mariah Milling cardiologist in the next month or 2 to establish cardiac care.  Patient has not had any embolic events such as TIA or DVT.   Past Medical History:  Diagnosis Date   COPD (chronic obstructive pulmonary disease) (HCC)    Hypertension    Paroxysmal A-fib (HCC)     Past Surgical History:  Procedure Laterality Date   APPENDECTOMY     CARDIOVERSION N/A 10/31/2022   Procedure: CARDIOVERSION;  Surgeon: Antonieta Iba, MD;  Location: [facility] ORS;  Service: Cardiovascular;  Laterality: N/A;   KNEE SURGERY      Prior to Admission medications   Medication Sig Start Date End Date Taking? Authorizing Provider  amiodarone (PACERONE) 200 MG tablet Take 1 tablet (200 mg total) by mouth daily. 04/29/23   Antonieta Iba, MD  atorvastatin (LIPITOR) 10 MG tablet Take 10 mg by mouth daily. 12/11/21   [provider]  Calcium Carb-Cholecalciferol (CALCIUM 600/VITAMIN D PO) Take 1 tablet by mouth in the morning and at bedtime.    [provider]  cephALEXin (KEFLEX) 500 MG capsule Take 1 capsule (500 mg total) by mouth 2 (two) times daily.  09/06/23   Jene Every, MD  ELIQUIS 5 MG TABS tablet Take 5 mg by mouth 2 (two) times daily. 12/18/21   [provider]  ferrous sulfate 324 MG TBEC Take 324 mg by mouth 3 (three) times a week. Take 1 tablet every morning, and 1 tablet in the evening every Wednesday and Saturday    [provider]  fludrocortisone (FLORINEF) 0.1 MG tablet Take 2 tablets (200 mcg total) by mouth 2 (two) times daily. 04/29/23   Antonieta Iba, MD  gabapentin (NEURONTIN) 300 MG capsule  Take 300-600 mg by mouth 3 (three) times daily. 12/12/21   [provider]  midodrine (PROAMATINE) 10 MG tablet Take 10 mg by mouth 3 (three) times daily. 04/12/22   [provider]  omeprazole (PRILOSEC) 20 MG capsule Take 1 capsule (20 mg total) by mouth 2 (two) times daily before a meal. 06/02/23 07/02/23  Sreenath, Jonelle Sports, MD  Oxcarbazepine (TRILEPTAL) 300 MG tablet Take 300 mg by mouth 2 (two) times daily. 04/03/23   [provider]  polyethylene glycol powder (MIRALAX) 17 GM/SCOOP powder Take 17 g by mouth as needed for mild constipation.    [provider]  potassium chloride SA (KLOR-CON M) 20 MEQ tablet Take 20 mEq by mouth daily.    [provider]  senna (SENOKOT) 8.6 MG TABS tablet Take 1 tablet by mouth daily as needed for mild constipation.    [provider]    Family History  Problem Relation Age of Onset   Kidney cancer Sister    Intracerebral hemorrhage Brother      Social History   Tobacco Use   Smoking status: Former   Smokeless tobacco: Never  Vaping Use   Vaping status: Never Used  Substance Use Topics   Alcohol use: Not Currently   Drug use: Not Currently    Allergies as of 09/11/2023 - Review Complete 09/11/2023  Allergen Reaction Noted   Pneumovax [pneumococcal polysaccharide vaccine]  08/03/2003   Pneumococcal vaccine  06/20/2013    Review of Systems:    All systems reviewed and negative except where noted in HPI.   Physical Exam:  BP (!) 180/78   Pulse 63   Temp 98.5 F (36.9 C)   Ht 5\' 6"  (1.676 m)   Wt 175 lb 12.8 oz (79.7 kg)   BMI 28.37 kg/m  No LMP recorded. Patient is postmenopausal.  General:   Alert,  Well-developed, well-nourished, pleasant and cooperative in NAD Lungs:  Respirations even and unlabored.  Clear throughout to auscultation.   No wheezes, crackles, or rhonchi. No acute distress. Heart:  Regular rate and rhythm; no murmurs, clicks, rubs, or gallops. Abdomen:  Normal  bowel sounds.  No bruits.  Soft, and non-distended without masses, hepatosplenomegaly or hernias noted.  No Tenderness.  No guarding or rebound tenderness.    Neurologic:  Alert and oriented x3;  grossly normal neurologically. Psych:  Alert and cooperative. Normal mood and affect.  Imaging Studies: CT Maxillofacial W Contrast  Result Date: 09/06/2023 CLINICAL DATA:  Sublingual/submandibular abscess. Angle of jaw swelling in lymphadenopathy. Question salivary stone. Right facial swelling. EXAM: CT MAXILLOFACIAL WITH CONTRAST TECHNIQUE: Multidetector CT imaging of the maxillofacial structures was performed with intravenous contrast. Multiplanar CT image reconstructions were also generated. RADIATION DOSE REDUCTION: This exam was performed according to the departmental dose-optimization program which includes automated exposure control, adjustment of the mA and/or kV according to patient size and/or use of iterative reconstruction technique. CONTRAST:  75mL OMNIPAQUE  IOHEXOL 300 MG/ML  SOLN COMPARISON:  CT head 01/08/2022 FINDINGS: Osseous: No acute or healing fractures are present. Mandible is intact and located. No focal osseous lesions are present. Moderate degenerative changes are present in the visualized upper cervical spine. Alignment is anatomic. Orbits: Right lens replacement is noted. Globes and orbits are otherwise within normal limits. Sinuses: Minimal mucosal thickening is present in the right maxillary sinus. The paranasal sinuses and mastoid air cells are otherwise clear. Soft tissues: Asymmetric homogeneous enhancement is present in the right parotid gland. No focal mass lesion is present. No duct obstruction is present. The left parotid gland is normal. Submandibular glands and ducts are within normal limits. Limited intracranial: Within normal limits. IMPRESSION: 1. Asymmetric homogeneous enhancement in the right parotid gland without a focal mass lesion or duct obstruction. This likely  represents a nonspecific parotitis. 2. No acute or healing fractures. 3. Moderate degenerative changes in the visualized upper cervical spine. Electronically Signed   By: Marin Roberts M.D.   On: 09/06/2023 10:51   DG Chest 2 View  Result Date: 09/06/2023 CLINICAL DATA:  89 year old female with possible bronchi is. EXAM: CHEST - 2 VIEW COMPARISON:  Chest x-ray 06/03/2023. FINDINGS: Lung volumes are normal. No consolidative airspace disease. No pleural effusions. No pneumothorax. No pulmonary nodule or mass noted. Pulmonary vasculature and the cardiomediastinal silhouette are within normal limits. Atherosclerosis in the thoracic aorta. IMPRESSION: 1.  No radiographic evidence of acute cardiopulmonary disease. 2. Aortic atherosclerosis. Electronically Signed   By: Trudie Reed M.D.   On: 09/06/2023 09:07    Assessment and Plan:   Tricia Edwards is a 89 y.o. y/o female has been referred for follow-up of upper GI bleed and acute on chronic blood loss anemia.  She was hospitalized in [place] 07/11/23 - 07/17/23 for duodenal GI bleed.  Presented with worsening shortness of breath fatigue, found to have worsening anemia.  Hemoglobin was 6.4g at admission. Patient received 2 units PRBC transfusion.  Treated with PPI and Eliquis was held.  Currently off Eliquis for 3 weeks.  Most recent lab 09/06/2023 showed improved stable hemoglobin 11.7.  EGD 07/15/23 showed non-bleeding angioectasias in the duodenum. Treated with argon plasma coagulation (APC).Clip (MR conditional) was placed.  Acute on Chronic Blood Loss Anemia -improved and stable Upper GI bleeding, due to duodenal angioectasia Hx of Iron Deficiency Anemia and Anemia of Chronic Disease Hx paroxysmal atrial fibrillation on Eliquis Chronic constipation   Plan: -Continue Prilosec 20 mg daily. .-Continue iron tablet once daily. -Avoid NSAIDs. -Repeat CBC, iron panel, ferritin in 1 month - lab visit. -Follow-up with cardiologist Dr. Mariah Milling to  decide about restarting Eliquis. -We discussed risks and benefits of taking blood thinner (Eliquis) at length. -They are aware patient is at increased risk of embolic event off Eliquis. -ED precautions given if she has symptoms of recurrent GI bleeding.   -Take MiraLAX every day.  Continue stool softener and senna as needed.  Follow up office visit in 3 months with Dr. Allegra Lai.  Celso Amy, PA-C

## 2023-09-11 ENCOUNTER — Encounter: Payer: Self-pay | Admitting: Physician Assistant

## 2023-09-11 ENCOUNTER — Ambulatory Visit: Payer: Medicare Other | Admitting: Physician Assistant

## 2023-09-11 VITALS — BP 180/78 | HR 63 | Temp 98.5°F | Ht 66.0 in | Wt 175.8 lb

## 2023-09-11 DIAGNOSIS — K922 Gastrointestinal hemorrhage, unspecified: Secondary | ICD-10-CM | POA: Diagnosis not present

## 2023-09-11 DIAGNOSIS — I480 Paroxysmal atrial fibrillation: Secondary | ICD-10-CM | POA: Diagnosis not present

## 2023-09-11 DIAGNOSIS — D500 Iron deficiency anemia secondary to blood loss (chronic): Secondary | ICD-10-CM

## 2023-09-11 DIAGNOSIS — Z7901 Long term (current) use of anticoagulants: Secondary | ICD-10-CM

## 2023-09-11 DIAGNOSIS — K5520 Angiodysplasia of colon without hemorrhage: Secondary | ICD-10-CM | POA: Diagnosis not present

## 2023-09-11 DIAGNOSIS — D62 Acute posthemorrhagic anemia: Secondary | ICD-10-CM

## 2023-09-11 DIAGNOSIS — Z8719 Personal history of other diseases of the digestive system: Secondary | ICD-10-CM

## 2023-09-11 DIAGNOSIS — D649 Anemia, unspecified: Secondary | ICD-10-CM

## 2023-09-17 ENCOUNTER — Telehealth: Payer: Self-pay | Admitting: Cardiovascular Disease

## 2023-09-17 NOTE — Telephone Encounter (Signed)
Pt's daughter is wondering if the pt can go back on Eliquis or not. Please advise

## 2023-09-17 NOTE — Telephone Encounter (Signed)
Per patient Tricia Edwards note from 09/11/2023:  08/07/2023 went back to [facility] ED with dark stools.  Hemoccult positive.  Eliquis was held.  She was told to follow-up with GI.  She has been off Eliquis for 3 weeks.  Stools have been normal brown color for the past week.  She denies abdominal pain.  Has chronic constipation.  Take stool softener, senna, and MiraLAX with benefit.  No NSAID use.  Takes Tylenol as needed.   She has history of paroxysmal atrial fibrillation.  Appointment with Dr. Mariah Milling cardiologist in the next month or 2 to establish cardiac care.  Patient has not had any embolic events such as TIA or DVT.  Patient has upcoming Cards appointment 12/17. Will route to MD to advise further on Eliquis restart.  Thanks!

## 2023-09-19 NOTE — Telephone Encounter (Signed)
Called daughter per DPR. Informed her of the following from Dr. Mariah Milling.  It appears GI team does not have any further procedures scheduled She could restart Eliquis 5 twice daily Would check CBC 2 weeks after Eliquis restart For recurrent bleeding, we would need to stop Eliquis, likely indefinitely Thx TG   Daughter verbalizes understanding.

## 2023-09-30 ENCOUNTER — Ambulatory Visit: Payer: Medicare Other | Admitting: Gastroenterology

## 2023-10-14 ENCOUNTER — Other Ambulatory Visit: Payer: Self-pay

## 2023-10-14 DIAGNOSIS — D500 Iron deficiency anemia secondary to blood loss (chronic): Secondary | ICD-10-CM

## 2023-10-15 LAB — CBC WITH DIFFERENTIAL/PLATELET
Basophils Absolute: 0 10*3/uL (ref 0.0–0.2)
Basos: 1 %
EOS (ABSOLUTE): 0.1 10*3/uL (ref 0.0–0.4)
Eos: 2 %
Hematocrit: 33.3 % — ABNORMAL LOW (ref 34.0–46.6)
Hemoglobin: 10.7 g/dL — ABNORMAL LOW (ref 11.1–15.9)
Immature Grans (Abs): 0 10*3/uL (ref 0.0–0.1)
Immature Granulocytes: 0 %
Lymphocytes Absolute: 1 10*3/uL (ref 0.7–3.1)
Lymphs: 20 %
MCH: 29.7 pg (ref 26.6–33.0)
MCHC: 32.1 g/dL (ref 31.5–35.7)
MCV: 93 fL (ref 79–97)
Monocytes Absolute: 0.7 10*3/uL (ref 0.1–0.9)
Monocytes: 15 %
Neutrophils Absolute: 3 10*3/uL (ref 1.4–7.0)
Neutrophils: 62 %
Platelets: 248 10*3/uL (ref 150–450)
RBC: 3.6 x10E6/uL — ABNORMAL LOW (ref 3.77–5.28)
RDW: 15 % (ref 11.7–15.4)
WBC: 4.9 10*3/uL (ref 3.4–10.8)

## 2023-10-15 LAB — IRON,TIBC AND FERRITIN PANEL
Ferritin: 33 ng/mL (ref 15–150)
Iron Saturation: 49 % (ref 15–55)
Iron: 162 ug/dL — ABNORMAL HIGH (ref 27–139)
Total Iron Binding Capacity: 330 ug/dL (ref 250–450)
UIBC: 168 ug/dL (ref 118–369)

## 2023-11-01 ENCOUNTER — Other Ambulatory Visit: Payer: Self-pay

## 2023-11-01 ENCOUNTER — Emergency Department: Payer: Medicare Other

## 2023-11-01 ENCOUNTER — Emergency Department
Admission: EM | Admit: 2023-11-01 | Discharge: 2023-11-01 | Disposition: A | Discharge status: Home / Self Care | Payer: Medicare Other | Attending: Student in an Organized Health Care Education/Training Program | Admitting: Student in an Organized Health Care Education/Training Program

## 2023-11-01 DIAGNOSIS — I480 Paroxysmal atrial fibrillation: Secondary | ICD-10-CM | POA: Diagnosis not present

## 2023-11-01 DIAGNOSIS — I5033 Acute on chronic diastolic (congestive) heart failure: Secondary | ICD-10-CM | POA: Insufficient documentation

## 2023-11-01 DIAGNOSIS — I10 Essential (primary) hypertension: Secondary | ICD-10-CM

## 2023-11-01 DIAGNOSIS — M25531 Pain in right wrist: Secondary | ICD-10-CM | POA: Insufficient documentation

## 2023-11-01 DIAGNOSIS — N1831 Chronic kidney disease, stage 3a: Secondary | ICD-10-CM | POA: Insufficient documentation

## 2023-11-01 DIAGNOSIS — I130 Hypertensive heart and chronic kidney disease with heart failure and stage 1 through stage 4 chronic kidney disease, or unspecified chronic kidney disease: Secondary | ICD-10-CM | POA: Insufficient documentation

## 2023-11-01 NOTE — ED Notes (Signed)
See triage note  Presents with pain to right wrist  Denies fall but states hit the door  No deformity noted  Good pulses   Also is concerned about her b/p

## 2023-11-01 NOTE — ED Triage Notes (Signed)
Pt to ED for right wrist pain. States hit it on door on bus yesterday. Pt wearing brace on wrist.  Daughter also reports checking pt bp this am and it was high, states usually takes midodrine for hypotension.

## 2023-11-01 NOTE — ED Provider Notes (Signed)
[facility] Provider Note    Event Date/Time   First MD Initiated Contact with Patient 11/01/23 [phone]     (approximate)   History   Wrist Pain and Hypertension   HPI  Tricia Edwards is a 89 y.o. female who presents today for evaluation of right wrist pain.  Patient reports that she hit her wrist on a railing and is had pain to her wrist ever since.  However, now patient reports that her pain has resolved.  There were no other injury sustained.  She presents with her daughter who also notes that her blood pressure was high, but daughter reports that it gets high when she comes to the emergency department and "is not worried about it."  She also reports that it has been this high before, but always comes down later and she is not interested in starting any medicines.  Patient denies any headache, visual changes, trouble walking, trouble speaking, chest pain, or shortness of breath.  Daughter corroborates this.  Patient Active Problem List   Diagnosis Date Noted   Lower extremity weakness 06/03/2023   AKI (acute kidney injury) (HCC) 06/03/2023   Symptomatic anemia 05/31/2023   HTN (hypertension) 05/31/2023   Pneumonia 11/03/2022   Acute respiratory failure with hypoxia (HCC) 11/02/2022   Persistent atrial fibrillation (HCC) 10/31/2022   SOB (shortness of breath) 10/31/2022   Acute on chronic diastolic CHF (congestive heart failure) (HCC) 05/23/2022   Atrial fibrillation with RVR (HCC) 05/22/2022   Chronic kidney disease, stage 3a (HCC) 05/22/2022   Iron deficiency anemia 05/22/2022   Syncope 05/22/2022   Elevated troponin    Acute UTI 01/08/2022   Hepatic steatosis 12/04/2021   Chronic idiopathic constipation 08/21/2021   Chronic diastolic congestive heart failure (HCC) 05/25/2021   Paroxysmal atrial fibrillation (HCC) 05/25/2021   Hypokalemia 01/18/2021   Chronic orthostatic hypotension 01/18/2021   History of trigeminal neuralgia 01/18/2021    Dizziness 08/08/2020   Osteoporosis 12/30/2018   Vitamin D deficiency 12/22/2018   Syncope and collapse 03/15/2018   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/09/2018   Unspecified abdominal hernia without obstruction or gangrene 05/05/2017   Gastro-esophageal reflux disease with esophagitis 03/10/2017   Anemia, unspecified 02/06/2017   Macular degeneration, dry 01/01/2017   Pseudophakia of right eye 01/01/2017   Vitamin B12 deficiency 01/01/2017   Hyponatremia 08/22/2016   Trigeminal neuralgia of left side of face 03/24/2016   Radiculopathy, cervical region 03/24/2016   Atherosclerotic heart disease of native coronary artery without angina pectoris 11/08/2015   Stage 3 chronic kidney disease (HCC) 10/29/2013   Spondylosis without myelopathy or radiculopathy, site unspecified 06/28/2013   Nuclear senile cataract 01/07/2013   Obesity 08/09/2010   Benign paroxysmal positional vertigo 10/18/2008   Other dorsalgia 12/04/2005   Hyperlipidemia 09/23/2004          Physical Exam   Triage Vital Signs: ED Triage Vitals  Encounter Vitals Group     BP 11/01/23 0804 (!) 192/95     Systolic BP Percentile --      Diastolic BP Percentile --      Pulse Rate 11/01/23 0804 61     Resp 11/01/23 0802 18     Temp 11/01/23 0802 98.3 F (36.8 C)     Temp src --      SpO2 11/01/23 0804 98 %     Weight 11/01/23 0803 175 lb (79.4 kg)     Height 11/01/23 0803 5\' 7"  (1.702 m)     Head Circumference --  Peak Flow --      Pain Score 11/01/23 0802 10     Pain Loc --      Pain Education --      Exclude from Growth Chart --     Most recent vital signs: Vitals:   11/01/23 0802 11/01/23 0804  BP:  (!) 192/95  Pulse:  61  Resp: 18   Temp: 98.3 F (36.8 C)   SpO2:  98%    Physical Exam Vitals and nursing note reviewed.  Constitutional:      General: Awake and alert. No acute distress.    Appearance: Normal appearance. The patient is normal weight.  HENT:     Head: Normocephalic and  atraumatic.     Mouth: Mucous membranes are moist.  Eyes:     General: PERRL. Normal EOMs        Right eye: No discharge.        Left eye: No discharge.     Conjunctiva/sclera: Conjunctivae normal.  Cardiovascular:     Rate and Rhythm: Normal rate and regular rhythm.     Pulses: Normal pulses.  Pulmonary:     Effort: Pulmonary effort is normal. No respiratory distress.     Breath sounds: Normal breath sounds.  Abdominal:     Abdomen is soft. There is no abdominal tenderness. No rebound or guarding. No distention. Musculoskeletal:        General: No swelling. Normal range of motion.     Cervical back: Normal range of motion and neck supple.  Right wrist: No swelling, erythema, or tenderness to the wrist or hands.  She has an old appearing bruise to her hand but no tenderness in this location.  Normal radial pulse.  Normal intrinsic muscle function of her hand and wrist.  Sensation intact light touch throughout.  No tenderness to elbow or shoulder. Sensation intact to light touch throughout.  Compartments are soft and compressible throughout.   Skin:    General: Skin is warm and dry.     Capillary Refill: Capillary refill takes less than 2 seconds.     Findings: No rash.  Neurological:     Mental Status: The patient is awake and alert.      ED Results / Procedures / Treatments   Labs (all labs ordered are listed, but only abnormal results are displayed) Labs Reviewed - No data to display   EKG     RADIOLOGY I independently reviewed and interpreted imaging and agree with radiologists findings.     PROCEDURES:  Critical Care performed:   Procedures   MEDICATIONS ORDERED IN ED: Medications - No data to display   IMPRESSION / MDM / ASSESSMENT AND PLAN / ED COURSE  I reviewed the triage vital signs and the nursing notes.   Differential diagnosis includes, but is not limited to, contusion, fracture, hematoma, hypertension.  Patient presents to the emergency  department awake and alert, at mental baseline per daughter at bedside, nontoxic in appearance.  She is hypertensive on arrival, though per chart review during her recent visit to gastroenterology she was similarly high at 180/78.  Daughter reports that she is often in the 190s and it always comes down shortly thereafter, and typically spikes when she is anxious, and patient is reportedly always anxious when she comes to the emergency department.  She currently does not have any symptoms of hypertension, no headache, visual changes, chest pain, shortness of breath, or any other complaints.  Discussed the option of initiating antihypertensive  however explained the risks of treating asymptomatic hypertension, and also patient has an appointment with her cardiologist in 3 days, recommended discussion at that time.  Daughter does not wish to start antihypertensive therapy today and agrees with plan to follow-up with cardiologist.  However, she understands return precautions for signs of symptomatic hypotension.  Patient has a normal appearing wrist without deformity.  She has full and normal range of motion.  Normal radial pulse, normal intrinsic muscle function of the hand.  No open wounds.  Sensation intact to light touch throughout.  Compartments are soft and compressible throughout.  X-ray obtained and reveals advanced osteoarthritis without radiographic abnormality or osseous abnormality.  Patient and her daughter are reassured by these findings.  She was given a wrist splint fracture support.  We discussed return precautions and outpatient follow-up.  Patient and daughter understand agree with plan.  She was discharged in stable condition.   Patient's presentation is most consistent with acute complicated illness / injury requiring diagnostic workup.     FINAL CLINICAL IMPRESSION(S) / ED DIAGNOSES   Final diagnoses:  Right wrist pain  Hypertension, unspecified type     Rx / DC Orders   ED  Discharge Orders     None        Note:  This document was prepared using Dragon voice recognition software and may include unintentional dictation errors.   Keturah Shavers 11/01/23 1114    Willy Eddy, MD 11/01/23 [phone]

## 2023-11-01 NOTE — Discharge Instructions (Signed)
Your x-ray does not show any broken bones.  Please follow-up with your cardiologist in 3 days as you have scheduled to discuss your blood pressure.  Please return for any new, worsening, or change in symptoms or other concerns.  Was a pleasure caring for you today.

## 2023-11-03 NOTE — Progress Notes (Deleted)
Cardiology Office Note  Date:  11/03/2023   ID:  Arbor Stormont, DOB 14-Apr-1930, MRN [id number]  PCP:  [facility]   No chief complaint on file.   HPI:  89 y.o. female with medical history significant for  paroxysmal A-fib on anticoagulation, history of cardioversion orthostatic hypotension with episodes of syncope and near syncope,  chronic labile blood pressure on midodrine and fludrocortisone daily  trigeminal neuralgia on carbamazepine  brought into the ER January 08, 2022 by her daughter for evaluation after she had several syncopal episodes that were witnessed. She presents today for office follow-up for near-syncope, syncope, atrial fibrillation  Presents today with her daughter Followed by telemedicine visits at [facility] for neurosurgery  Seen at [facility] March 2024 S/p stereotactic radiosurgery in early September for her medically refractory left trigeminal neuralgia.    Cardioversion October 31, 2022 Maintaining normal sinus rhythm, on Eliquis No recent falls Daughter denies near syncope or syncope  Remains on fludrocortisone 0.2 daily, has midodrine 5 up to 10 mg as needed 3 times daily Sitting pressures remained 160 up to 180s, rarely higher Gets nervous if pressure goes to 200 sitting with feet up  Spends most of her time sitting, Daughter can tell when she is having low blood pressure symptoms, gets a certain look, gets wobbly Daughter is with her 24/7  Orthostatics today,170 systolic, down to 120 systolic Drops in pressure 50 pounds today Did not take midodrine this morning  Continues to have symptoms from trigeminal neuralgia, may need to come off Eliquis for treatment  EKG personally reviewed by myself on todays visit Normal sinus rhythm rate 58 bpm no significant ST-T wave changes  No past medical tree reviewed Admitted to the hospital July 2023 atrial fibrillation with RVR Syncopal episode with  A-fib Treated with Cardizem infusion  In the emergency room September 08, 2022 for atrial fibrillation Given diltiazem 30 mg twice daily  Seen in clinic September 10, 2022 by one of our providers On that visit was taking diltiazem 30 3 times daily On that visit was in atrial fibrillation Was started on amiodarone 400 twice daily for 1 week then 200 twice daily 1 week then 200 after that  Other past medical history reviewed Admitted in July 2023 for A-fib with RVR, syncope, acute heart failure.  It was felt acute heart failure exacerbated A-fib rates.  She was initially placed on Cardizem with resolution of A-fib. Patient was given 1 dose of Lasix 20 mg.   ER visit 09/08/2022 for dizziness and A-fib.  Pulse reported 120-160.  Blood pressure was 120/92, pulse rate 126 bpm.  EKG showed A-fib with a heart rate of 121.  Patient was given injection of cortisone 5 mg and diltiazem tablet 30 mg.  Dr. Duke Salvia was consulted who recommended Cardizem 30 mg twice a day.  Seen by one of our providers October 2023  Prior syncope episodes  first episode while sitting on the commode.  Patient states that she had strained to have a bowel movement and subsequently felt dizzy and lightheaded.  Per her daughter she had a transient loss of consciousness and she took her back to her room and put her in bed. Patient had 2 more episodes while ambulatory and each time she felt dizzy and lightheaded and went limp.  Her daughter was concerned about the frequency of these episodes and states that she usually has 1 but never this many in 1 day. Patient has been on antibiotic therapy  for UTI but denies having any fever, no chills, no nausea, no vomiting, no abdominal pain, no leg swelling, no headache, no cough, no palpitations, no diaphoresis, no blurred vision, no focal deficit. She received 2 L of IV fluids in the ER Presenting in atrial fibrillation, converted to sinus rhythm in the hospital after digoxin given Family  declined amiodarone  Echocardiogram showing LVH, small LV cavity, morbid obesity, deconditioned Long history of orthostasis, near syncope and syncope dating back several years, managed by cardiology in [place] -Discussed various strategies with him including taking water, midodrine first thing in the morning before getting up Avoiding hot meals, large meals especially if she has not had her midodrine -Consider abdominal binder, compression hose Staying well-hydrated Checking orthostatics on a regular basis especially before going out shopping Extra midodrine as needed for orthostatic numbers  Zio monitor Apr 10, 2022 reviewed in detail Patient had a min HR of 57 bpm, max HR of 160 bpm, and avg HR of 71 bpm.   115 Supraventricular Tachycardia runs occurred, the run with the fastest interval lasting 5 beats with a max rate of 160 bpm, the longest lasting 14 beats with an avg rate of 92 bpm. (Not patient triggered)   Isolated SVEs were rare (<1.0%), SVE Couplets were rare (<1.0%), and SVE Triplets were rare (<1.0%). Isolated VEs were occasional (1.2%, 16466), VE Couplets were rare (<1.0%, 33), and no VE Triplets  were present. Ventricular Bigeminy and Trigeminy were present.    Patient triggered events (3) associated with normal sinus rhythm, rare PVC   PMH:   has a past medical history of COPD (chronic obstructive pulmonary disease) (HCC), Hypertension, and Paroxysmal A-fib (HCC).  PSH:    Past Surgical History:  Procedure Laterality Date   APPENDECTOMY     CARDIOVERSION N/A 10/31/2022   Procedure: CARDIOVERSION;  Surgeon: Antonieta Iba, MD;  Location: [facility] ORS;  Service: Cardiovascular;  Laterality: N/A;   KNEE SURGERY      Current Outpatient Medications  Medication Sig Dispense Refill   amiodarone (PACERONE) 200 MG tablet Take 1 tablet (200 mg total) by mouth daily. 90 tablet 3   atorvastatin (LIPITOR) 10 MG tablet Take 10 mg by mouth daily.     Calcium  Carb-Cholecalciferol (CALCIUM 600/VITAMIN D PO) Take 1 tablet by mouth in the morning and at bedtime.     cephALEXin (KEFLEX) 500 MG capsule Take 1 capsule (500 mg total) by mouth 2 (two) times daily. 14 capsule 0   ferrous sulfate 324 MG TBEC Take 324 mg by mouth 3 (three) times a week. Take 1 tablet every morning, and 1 tablet in the evening every Wednesday and Saturday     fludrocortisone (FLORINEF) 0.1 MG tablet Take 2 tablets (200 mcg total) by mouth 2 (two) times daily. 120 tablet 6   gabapentin (NEURONTIN) 300 MG capsule Take 300-600 mg by mouth 3 (three) times daily.     midodrine (PROAMATINE) 10 MG tablet Take 10 mg by mouth 3 (three) times daily.     omeprazole (PRILOSEC) 20 MG capsule Take 1 capsule (20 mg total) by mouth 2 (two) times daily before a meal. 60 capsule 0   Oxcarbazepine (TRILEPTAL) 300 MG tablet Take 300 mg by mouth 2 (two) times daily.     polyethylene glycol powder (MIRALAX) 17 GM/SCOOP powder Take 17 g by mouth as needed for mild constipation.     potassium chloride SA (KLOR-CON M) 20 MEQ tablet Take 20 mEq by mouth daily.  senna (SENOKOT) 8.6 MG TABS tablet Take 1 tablet by mouth daily as needed for mild constipation.     No current facility-administered medications for this visit.    Allergies:   Pneumovax [pneumococcal polysaccharide vaccine] and Pneumococcal vaccine   Social History:  The patient  reports that she has quit smoking. She has never used smokeless tobacco. She reports that she does not currently use alcohol. She reports that she does not currently use drugs.   Family History:   family history includes Intracerebral hemorrhage in her brother; Kidney cancer in her sister.    Review of Systems: Review of Systems  Constitutional: Negative.   HENT: Negative.    Respiratory: Negative.    Cardiovascular: Negative.   Gastrointestinal: Negative.   Musculoskeletal: Negative.   Neurological: Negative.   Psychiatric/Behavioral: Negative.    All  other systems reviewed and are negative.    PHYSICAL EXAM: VS:  There were no vitals taken for this visit. , BMI There is no height or weight on file to calculate BMI. Constitutional:  oriented to person, place, and time. No distress.  In a wheelchair HENT:  Head: Grossly normal Eyes:  no discharge. No scleral icterus.  Neck: No JVD, no carotid bruits  Cardiovascular: Irregularly irregular, no murmurs appreciated Pulmonary/Chest: Clear to auscultation bilaterally, no wheezes or rails Abdominal: Soft.  no distension.  no tenderness.  Musculoskeletal: Normal range of motion Neurological:  normal muscle tone. Coordination normal. No atrophy Skin: Skin warm and dry Psychiatric: normal affect, pleasant  Recent Labs: 06/03/2023: B Natriuretic Peptide 372.4 06/05/2023: Magnesium 2.2 08/07/2023: ALT 13 09/06/2023: BUN 19; Creatinine, Ser 1.24; Potassium 3.5; Sodium 140 10/14/2023: Hemoglobin 10.7; Platelets 248   Lipid Panel No results found for: "CHOL", "HDL", "LDLCALC", "TRIG"    Wt Readings from Last 3 Encounters:  11/01/23 175 lb (79.4 kg)  09/11/23 175 lb 12.8 oz (79.7 kg)  09/06/23 170 lb (77.1 kg)     ASSESSMENT AND PLAN:  Problem List Items Addressed This Visit   None  Paroxysmal atrial fibrillation Tolerating Eliquis, no recent falls Prior cardioversion Maintaining normal sinus rhythm, continue amiodarone 200 daily  Orthostatic hypotension Continues to have symptoms, perhaps less symptomatic with behavior modification, wears thigh-high compressions, hydrates, and in in the normal sinus rhythm  On today's visit did not take midodrine, dropping 50 points with standing, mildly symptomatic Recommend she follow midodrine regimen as below For systolic pressure <150, take midodrine 10 mg  For systolic pressure >150 to 160, take midodire 5 mg For systolic pressure >160, hold midodrine  Anemia On Eliquis, hemoglobin stable   Total encounter time more than 40 minutes   Greater than 50% was spent in counseling and coordination of care with the patient    Signed, Dossie Arbour, M.D., Ph.D. [facility] [place], [place] [phone]

## 2023-11-04 ENCOUNTER — Ambulatory Visit: Payer: Medicare Other | Admitting: Cardiovascular Disease

## 2023-11-04 DIAGNOSIS — R55 Syncope and collapse: Secondary | ICD-10-CM

## 2023-11-04 DIAGNOSIS — I2510 Atherosclerotic heart disease of native coronary artery without angina pectoris: Secondary | ICD-10-CM

## 2023-11-04 DIAGNOSIS — E782 Mixed hyperlipidemia: Secondary | ICD-10-CM

## 2023-11-04 DIAGNOSIS — I5032 Chronic diastolic (congestive) heart failure: Secondary | ICD-10-CM

## 2023-11-04 DIAGNOSIS — I4819 Other persistent atrial fibrillation: Secondary | ICD-10-CM

## 2023-11-04 DIAGNOSIS — D509 Iron deficiency anemia, unspecified: Secondary | ICD-10-CM

## 2023-11-04 DIAGNOSIS — I951 Orthostatic hypotension: Secondary | ICD-10-CM

## 2024-01-23 ENCOUNTER — Emergency Department
Admission: EM | Admit: 2024-01-23 | Discharge: 2024-01-23 | Disposition: A | Discharge status: Home / Self Care | Attending: Emergency Medicine | Admitting: Emergency Medicine

## 2024-01-23 ENCOUNTER — Emergency Department

## 2024-01-23 ENCOUNTER — Other Ambulatory Visit: Payer: Self-pay

## 2024-01-23 DIAGNOSIS — E041 Nontoxic single thyroid nodule: Secondary | ICD-10-CM

## 2024-01-23 DIAGNOSIS — I4891 Unspecified atrial fibrillation: Secondary | ICD-10-CM | POA: Diagnosis not present

## 2024-01-23 DIAGNOSIS — I509 Heart failure, unspecified: Secondary | ICD-10-CM | POA: Insufficient documentation

## 2024-01-23 DIAGNOSIS — R55 Syncope and collapse: Secondary | ICD-10-CM | POA: Insufficient documentation

## 2024-01-23 LAB — COMPREHENSIVE METABOLIC PANEL
ALT: 20 U/L (ref 0–44)
AST: 39 U/L (ref 15–41)
Albumin: 3.4 g/dL — ABNORMAL LOW (ref 3.5–5.0)
Alkaline Phosphatase: 70 U/L (ref 38–126)
Anion gap: 11 (ref 5–15)
BUN: 22 mg/dL (ref 8–23)
CO2: 29 mmol/L (ref 22–32)
Calcium: 8.6 mg/dL — ABNORMAL LOW (ref 8.9–10.3)
Chloride: 99 mmol/L (ref 98–111)
Creatinine, Ser: 1.24 mg/dL — ABNORMAL HIGH (ref 0.44–1.00)
GFR, Estimated: 41 mL/min — ABNORMAL LOW (ref >60)
Glucose, Bld: 86 mg/dL (ref 70–99)
Potassium: 3.3 mmol/L — ABNORMAL LOW (ref 3.5–5.1)
Sodium: 139 mmol/L (ref 135–145)
Total Bilirubin: 0.8 mg/dL (ref 0.0–1.2)
Total Protein: 6 g/dL — ABNORMAL LOW (ref 6.5–8.1)

## 2024-01-23 LAB — CBC WITH DIFFERENTIAL/PLATELET
Abs Immature Granulocytes: 0.02 10*3/uL (ref 0.00–0.07)
Basophils Absolute: 0 10*3/uL (ref 0.0–0.1)
Basophils Relative: 1 %
Eosinophils Absolute: 0.2 10*3/uL (ref 0.0–0.5)
Eosinophils Relative: 3 %
HCT: 32.5 % — ABNORMAL LOW (ref 36.0–46.0)
Hemoglobin: 9.8 g/dL — ABNORMAL LOW (ref 12.0–15.0)
Immature Granulocytes: 0 %
Lymphocytes Relative: 18 %
Lymphs Abs: 1.1 10*3/uL (ref 0.7–4.0)
MCH: 31.2 pg (ref 26.0–34.0)
MCHC: 30.2 g/dL (ref 30.0–36.0)
MCV: 103.5 fL — ABNORMAL HIGH (ref 80.0–100.0)
Monocytes Absolute: 1 10*3/uL (ref 0.1–1.0)
Monocytes Relative: 16 %
Neutro Abs: 3.8 10*3/uL (ref 1.7–7.7)
Neutrophils Relative %: 62 %
Platelets: 266 10*3/uL (ref 150–400)
RBC: 3.14 MIL/uL — ABNORMAL LOW (ref 3.87–5.11)
RDW: 14.5 % (ref 11.5–15.5)
WBC: 6.1 10*3/uL (ref 4.0–10.5)
nRBC: 0 % (ref 0.0–0.2)

## 2024-01-23 LAB — TROPONIN I (HIGH SENSITIVITY)
Troponin I (High Sensitivity): 13 ng/L (ref <18)
Troponin I (High Sensitivity): 13 ng/L (ref <18)

## 2024-01-23 LAB — BRAIN NATRIURETIC PEPTIDE: B Natriuretic Peptide: 506.1 pg/mL — ABNORMAL HIGH (ref 0.0–100.0)

## 2024-01-23 MED ORDER — IOHEXOL 350 MG/ML SOLN
60.0000 mL | Freq: Once | INTRAVENOUS | Status: AC | PRN
  Administered 2024-01-23: 60 mL via INTRAVENOUS

## 2024-01-23 MED ORDER — SODIUM CHLORIDE 0.9 % IV BOLUS
500.0000 mL | Freq: Once | INTRAVENOUS | Status: AC
  Administered 2024-01-23: 500 mL via INTRAVENOUS

## 2024-01-23 NOTE — ED Provider Notes (Addendum)
 [facility] Provider Note    Event Date/Time   First MD Initiated Contact with Patient 01/23/24 1028     (approximate)   History   Near Syncope   HPI  Tricia Edwards is a 89 year old female with history of CHF, orthostatic hypotension, A-fib, anemia presenting to the emergency department for evaluation of syncope.  Accompanied by daughter provides collateral history.  Reports that patient has a longstanding history of infrequent syncopal episodes associated with orthostasis.  About a week ago, was started on Lasix, Levaquin, steroids in the setting of wheezing and concerns for fluid overload.  Has lost about a pound a day since that time, unknown goal.  Yesterday, when patient was walking she had a witnessed syncopal episode, had a second syncopal episode while sitting on the toilet earlier today, and a third later this morning.  No falls or head trauma.  Patient has noticed that since yesterday, her left leg has been bothering her.  Initially reported as "numb", but in asking further questions it sounds more like a heaviness with intact sensation that is symmetric compared to the other side.  Arm and face are not affected.     Physical Exam   Triage Vital Signs: ED Triage Vitals [01/23/24 1032]  Encounter Vitals Group     BP 113/78     Systolic BP Percentile      Diastolic BP Percentile      Pulse Rate (!) 58     Resp 19     Temp 98 F (36.7 C)     Temp src      SpO2 100 %     Weight      Height      Head Circumference      Peak Flow      Pain Score 8     Pain Loc      Pain Education      Exclude from Growth Chart     Most recent vital signs: Vitals:   01/23/24 1400 01/23/24 1517  BP: 135/71   Pulse: 92   Resp: 17   Temp:  97.6 F (36.4 C)  SpO2: 98%      General: Awake, interactive  CV:  Regular rate, good peripheral perfusion.  Resp:  Unlabored respirations, lungs clear to auscultation Abd:  Nondistended, soft, nontender to  palpation Neuro:  Keenly aware, correctly answers month and age, able to blink eyes and squeeze hands, normal extraocular movements, no appreciable field cut, normal facial symmetry, no arm or leg motor drift, no limb ataxia, normal sensation, no aphasia, no dysarthria, no inattention    ED Results / Procedures / Treatments   Labs (all labs ordered are listed, but only abnormal results are displayed) Labs Reviewed  CBC WITH DIFFERENTIAL/PLATELET - Abnormal; Notable for the following components:      Result Value   RBC 3.14 (*)    Hemoglobin 9.8 (*)    HCT 32.5 (*)    MCV 103.5 (*)    All other components within normal limits  COMPREHENSIVE METABOLIC PANEL - Abnormal; Notable for the following components:   Potassium 3.3 (*)    Creatinine, Ser 1.24 (*)    Calcium 8.6 (*)    Total Protein 6.0 (*)    Albumin 3.4 (*)    GFR, Estimated 41 (*)    All other components within normal limits  BRAIN NATRIURETIC PEPTIDE - Abnormal; Notable for the following components:   B Natriuretic Peptide 506.1 (*)  All other components within normal limits  TROPONIN I (HIGH SENSITIVITY)  TROPONIN I (HIGH SENSITIVITY)     EKG EKG independently reviewed interpreted by myself (ER attending) demonstrates:  EKG demonstrates A-fib at a rate of 82, QRS 94, QTc 509, no acute ST changes  RADIOLOGY Imaging independently reviewed and interpreted by myself demonstrates:  CTA head and neck without acute bleed on my review, formal read pending  PROCEDURES:  Critical Care performed: No  Procedures   MEDICATIONS ORDERED IN ED: Medications  sodium chloride 0.9 % bolus 500 mL (0 mLs Intravenous Stopped 01/23/24 1317)  iohexol (OMNIPAQUE) 350 MG/ML injection 60 mL (60 mLs Intravenous Contrast Given 01/23/24 1332)     IMPRESSION / MDM / ASSESSMENT AND PLAN / ED COURSE  I reviewed the triage vital signs and the nursing notes.  Differential diagnosis includes, but is not limited to, increased orthostasis  in the setting of recent diuretic use, electrolyte abnormality, arrhythmia, anemia, intracranial bleed, TIA/CVA  Patient's presentation is most consistent with acute presentation with potential threat to life or bodily function.  89 year old female presenting with recurrent syncope.  Stable vitals on presentation.  Will obtain labs, CT to further evaluate.  Suspect some component of overdiuresis, will trial small fluid bolus.   Labs with stable anemia and renal dysfunction.  Negative troponin.  Elevated BNP, but not clinically overloaded.  Patient reassessed.  Has not had any further episodes of lightheadedness here.  I did discuss admission in the setting of her multiple episodes of syncope.  Patient did strongly prefer to be discharged home.  Given her age and known history of recurrent syncope, do think this is reasonable as long as her CT is reassuring based on our shared decision-making conversation.  Signed out to oncoming provider pending radiology read and disposition.     FINAL CLINICAL IMPRESSION(S) / ED DIAGNOSES   Final diagnoses:  Syncope and collapse     Rx / DC Orders   ED Discharge Orders     None        Note:  This document was prepared using Dragon voice recognition software and may include unintentional dictation errors.   Trinna Post, MD 01/23/24 1537   CT resulted without acute findings.  Patient given information for follow-up regarding incidental thyroid nodule.  Patient reevaluated.  Continues to request discharge which I do think is reasonable.  Strict return precautions provided.  Patient discharged stable condition.   Trinna Post, MD 01/23/24 [phone]

## 2024-01-23 NOTE — ED Notes (Signed)
 RN at bedside to answer call bell. Pt reporting having to urinate. Pt placed on bedpan. Bedpan did not cath urine. Pt cleaned. New sheets in place.

## 2024-01-23 NOTE — Discharge Instructions (Addendum)
 You were seen in the ER today for evaluation of passing out.  Your testing fortunately did not show an emergency cause for this.  I suspect she may have had too much diuretic because need to be slightly dehydrated.  Do not take any further diuretics unless otherwise directed by your doctor.  Return to the ER for any new or worsening symptoms.  Follow with your primary care doctor for further evaluation.  Your CT did show a thyroid nodule that I do not suspect is causing your symptoms.  Discussed with your primary care doctor if they recommend any further testing for this.

## 2024-01-23 NOTE — ED Triage Notes (Signed)
 Pt comes with c/o near syncopal. Pt has had 3 episodes per family where she went out on them. Pt c/o left sided numbness also. Pt has hx of afib.   Pt states numbness at 0730 but then it went away.

## 2024-02-03 ENCOUNTER — Telehealth: Payer: Self-pay | Admitting: Cardiovascular Disease

## 2024-02-03 NOTE — Telephone Encounter (Signed)
 Called placed to the patient and her daughter. The daughter stated that the patient has been having fluctuating swelling with wheezing which has led to multiple ED visits. The PCP started the patient on Furosemide 20 mg as needed which helped the patient originally lose 10 pounds. The PCP then decreased the patient to 10 mg of Furosemide which again led to an increase in swelling and wheezing. She was also prescribed antibiotics and prednisone which did help with the wheezing.   The daughter stated that the patient is currently on 10 mg of Furosemide three times weekly. She has been having increased swelling and wheezing again. The daughter would like for the patient to be seen by cardiology and have recommendations moving forward since this seems to be a roller coaster for the patient.  Appointment with Dr. Mariah Milling on 3/25.

## 2024-02-03 NOTE — Telephone Encounter (Signed)
 Pt c/o swelling/edema: STAT if pt has developed SOB within 24 hours  If swelling, where is the swelling located? ankles  How much weight have you gained and in what time span? 3 lbs in 3 days  Have you gained 2 pounds in a day or 5 pounds in a week? no  Do you have a log of your daily weights (if so, list)?   Are you currently taking a fluid pill? yes  Are you currently SOB? Some, weezing  Have you traveled recently in a car or plane for an extended period of time?

## 2024-02-06 NOTE — Telephone Encounter (Signed)
 Called and spoke with daughter per DPR. Notified her of the following from Dr. Mariah Milling.  Somewhere between July 2024 and March 2025 he has gone into atrial fibrillation Would recommend she be seen in clinic This can cause shortness of breath and wheezing Thx TGollan   Daughter verbalizes understanding. Patient scheduled to be seen in clinic 02/10/24.

## 2024-02-09 NOTE — Progress Notes (Unsigned)
 Cardiology Office Note  Date:  02/10/2024   ID:  Tricia Edwards, DOB 01-04-1930, MRN [id number]  PCP:  [facility]   Chief Complaint  Patient presents with   6 month follow up     "Doing well."  Patient has been hospitalized twice since Dec. 2024; one in [place] & the other at [facility] both for CHF.      HPI:  89 y.o. female with medical history significant for  paroxysmal A-fib on anticoagulation, history of cardioversion 2023 orthostatic hypotension with episodes of syncope and near syncope,  chronic labile blood pressure on midodrine and fludrocortisone daily  trigeminal neuralgia on carbamazepine  brought into the ER January 08, 2022 by her daughter for evaluation after she had several syncopal episodes that were witnessed. She presents today for office follow-up for near-syncope, syncope, atrial fibrillation  Last seen by myself in clinic June 2024 Presents today with her daughter  In hospital dec 2024 in [place] with admission for 10 days  PNA, confusion, CHF, records pulled up and reviewed on daughter's phone Back into atrial fibrillation on EKG early January 2025  Since that time has had issues with shortness of breath and wheezing  2/25: Continued wheezing, SOB, PMD started lasix   ER visit January 23, 2024, in atrial fibrillation Near-syncope, orthostasis on Lasix Levaquin and steroids Witnessed syncope while sitting on toilet, several episodes that day In ER BNP 506, She was told to stop Lasix  Past week, continued wheezing Restarted lasix 3 x a week, 10 mg Even on Lasix 10 mg 3 days a week she continues to have wheezing worse in the morning, seems to get little bit better in the afternoon More tired in general Remains in atrial fibrillation today  Denies significant orthostasis symptoms Remains on fludrocortisone, midodrine Sedentary at baseline, sits most of the day Daughter can tell when she is having low blood pressure symptoms,  gets a certain look, gets wobbly Daughter is with her 24/7  EKG personally reviewed by myself on todays visit EKG Interpretation Date/Time:  Tuesday February 10 2024 14:57:19 EDT Ventricular Rate:  88 PR Interval:    QRS Duration:  102 QT Interval:  410 QTC Calculation: 496 R Axis:   -10  Text Interpretation: Atrial fibrillation Minimal voltage criteria for LVH, may be normal variant ( Cornell product ) Prolonged QT When compared with ECG of 23-Jan-2024 10:28, Nonspecific T wave abnormality has replaced inverted T waves in Anterior leads Confirmed by [name] [phone]) on 02/10/2024 3:02:47 PM    No past medical tree reviewed Admitted to the hospital July 2023 atrial fibrillation with RVR Syncopal episode with A-fib Treated with Cardizem infusion  In the emergency room September 08, 2022 for atrial fibrillation Given diltiazem 30 mg twice daily  Seen in clinic September 10, 2022 by one of our providers On that visit was taking diltiazem 30 3 times daily On that visit was in atrial fibrillation Was started on amiodarone 400 twice daily for 1 week then 200 twice daily 1 week then 200 after that  Other past medical history reviewed Admitted in July 2023 for A-fib with RVR, syncope, acute heart failure.  It was felt acute heart failure exacerbated A-fib rates.  She was initially placed on Cardizem with resolution of A-fib. Patient was given 1 dose of Lasix 20 mg.   ER visit 09/08/2022 for dizziness and A-fib.  Pulse reported 120-160.  Blood pressure was 120/92, pulse rate 126 bpm.  EKG showed A-fib with a heart  rate of 121.  Patient was given injection of cortisone 5 mg and diltiazem tablet 30 mg.  Dr. Duke Salvia was consulted who recommended Cardizem 30 mg twice a day.  Seen by one of our providers October 2023  Prior syncope episodes  first episode while sitting on the commode.  Patient states that she had strained to have a bowel movement and subsequently felt dizzy and lightheaded.   Per her daughter she had a transient loss of consciousness and she took her back to her room and put her in bed. Patient had 2 more episodes while ambulatory and each time she felt dizzy and lightheaded and went limp.  Her daughter was concerned about the frequency of these episodes and states that she usually has 1 but never this many in 1 day. Patient has been on antibiotic therapy for UTI but denies having any fever, no chills, no nausea, no vomiting, no abdominal pain, no leg swelling, no headache, no cough, no palpitations, no diaphoresis, no blurred vision, no focal deficit. She received 2 L of IV fluids in the ER Presenting in atrial fibrillation, converted to sinus rhythm in the hospital after digoxin given Family declined amiodarone  Echocardiogram showing LVH, small LV cavity, morbid obesity, deconditioned Long history of orthostasis, near syncope and syncope dating back several years, managed by cardiology in [place] -Discussed various strategies with him including taking water, midodrine first thing in the morning before getting up Avoiding hot meals, large meals especially if she has not had her midodrine -Consider abdominal binder, compression hose Staying well-hydrated Checking orthostatics on a regular basis especially before going out shopping Extra midodrine as needed for orthostatic numbers  Zio monitor Apr 10, 2022 reviewed in detail Patient had a min HR of 57 bpm, max HR of 160 bpm, and avg HR of 71 bpm.   115 Supraventricular Tachycardia runs occurred, the run with the fastest interval lasting 5 beats with a max rate of 160 bpm, the longest lasting 14 beats with an avg rate of 92 bpm. (Not patient triggered)   Isolated SVEs were rare (<1.0%), SVE Couplets were rare (<1.0%), and SVE Triplets were rare (<1.0%). Isolated VEs were occasional (1.2%, 16466), VE Couplets were rare (<1.0%, 33), and no VE Triplets  were present. Ventricular Bigeminy and Trigeminy were  present.    Patient triggered events (3) associated with normal sinus rhythm, rare PVC   PMH:   has a past medical history of COPD (chronic obstructive pulmonary disease) (HCC), Hypertension, and Paroxysmal A-fib (HCC).  PSH:    Past Surgical History:  Procedure Laterality Date   APPENDECTOMY     CARDIOVERSION N/A 10/31/2022   Procedure: CARDIOVERSION;  Surgeon: Antonieta Iba, MD;  Location: [facility] ORS;  Service: Cardiovascular;  Laterality: N/A;   KNEE SURGERY      Current Outpatient Medications  Medication Sig Dispense Refill   amiodarone (PACERONE) 200 MG tablet Take 1 tablet (200 mg total) by mouth daily. 90 tablet 3   atorvastatin (LIPITOR) 10 MG tablet Take 10 mg by mouth daily.     Calcium Carb-Cholecalciferol (CALCIUM 600/VITAMIN D PO) Take 1 tablet by mouth in the morning and at bedtime.     calcium citrate (CALCITRATE - DOSED IN MG ELEMENTAL CALCIUM) 950 (200 Ca) MG tablet Take 200 mg of elemental calcium by mouth daily.     cyanocobalamin 100 MCG tablet Take 100 mcg by mouth daily.     ELIQUIS 5 MG TABS tablet Take 5 mg by mouth 2 (two)  times daily.     ferrous sulfate 324 MG TBEC Take 324 mg by mouth 3 (three) times a week. Take 1 tablet every morning, and 1 tablet in the evening every Wednesday and Saturday     fludrocortisone (FLORINEF) 0.1 MG tablet Take 2 tablets (200 mcg total) by mouth 2 (two) times daily. 120 tablet 6   furosemide (LASIX) 20 MG tablet Take 10 mg by mouth every Monday, Wednesday, and Friday.     gabapentin (NEURONTIN) 300 MG capsule Take 300-600 mg by mouth 3 (three) times daily.     midodrine (PROAMATINE) 10 MG tablet Take 10 mg by mouth 3 (three) times daily.     omeprazole (PRILOSEC) 20 MG capsule Take 1 capsule (20 mg total) by mouth 2 (two) times daily before a meal. 60 capsule 0   Oxcarbazepine (TRILEPTAL) 300 MG tablet Take 300 mg by mouth 2 (two) times daily.     polyethylene glycol powder (MIRALAX) 17 GM/SCOOP powder Take 17 g by mouth as  needed for mild constipation.     potassium chloride SA (KLOR-CON M) 20 MEQ tablet Take 20 mEq by mouth daily.     senna (SENOKOT) 8.6 MG TABS tablet Take 1 tablet by mouth daily as needed for mild constipation.     No current facility-administered medications for this visit.    Allergies:   Pneumovax [pneumococcal polysaccharide vaccine] and Pneumococcal vaccine   Social History:  The patient  reports that she has quit smoking. She has never used smokeless tobacco. She reports that she does not currently use alcohol. She reports that she does not currently use drugs.   Family History:   family history includes Intracerebral hemorrhage in her brother; Kidney cancer in her sister.    Review of Systems: Review of Systems  Constitutional: Negative.   HENT: Negative.    Respiratory: Negative.    Cardiovascular: Negative.   Gastrointestinal: Negative.   Musculoskeletal: Negative.   Neurological: Negative.   Psychiatric/Behavioral: Negative.    All other systems reviewed and are negative.    PHYSICAL EXAM: VS:  BP (!) 140/70 (BP Location: Left Arm, Patient Position: Sitting, Cuff Size: Normal)   Pulse 88   Ht 5\' 7"  (1.702 m)   Wt 176 lb 4 oz (79.9 kg)   SpO2 96%   BMI 27.60 kg/m  , BMI Body mass index is 27.6 kg/m. Constitutional:  oriented to person, place, and time. No distress.  HENT:  Head: Grossly normal Eyes:  no discharge. No scleral icterus.  Neck: No JVD, no carotid bruits  Cardiovascular: Irregularly irregular, no murmurs appreciated Pulmonary/Chest: Clear to auscultation bilaterally, no wheezes or rails Abdominal: Soft.  no distension.  no tenderness.  Musculoskeletal: Normal range of motion Neurological:  normal muscle tone. Coordination normal. No atrophy Skin: Skin warm and dry Psychiatric: normal affect, pleasant  Recent Labs: 06/05/2023: Magnesium 2.2 01/23/2024: ALT 20; B Natriuretic Peptide 506.1; BUN 22; Creatinine, Ser 1.24; Hemoglobin 9.8; Platelets  266; Potassium 3.3; Sodium 139   Lipid Panel No results found for: "CHOL", "HDL", "LDLCALC", "TRIG"    Wt Readings from Last 3 Encounters:  02/10/24 176 lb 4 oz (79.9 kg)  11/01/23 175 lb (79.4 kg)  09/11/23 175 lb 12.8 oz (79.7 kg)     ASSESSMENT AND PLAN:  Problem List Items Addressed This Visit       Cardiology Problems   Chronic orthostatic hypotension   Relevant Medications   ELIQUIS 5 MG TABS tablet   furosemide (LASIX) 20 MG  tablet   Other Relevant Orders   EKG 12-Lead (Completed)   Chronic diastolic congestive heart failure (HCC)   Relevant Medications   ELIQUIS 5 MG TABS tablet   furosemide (LASIX) 20 MG tablet   Other Relevant Orders   EKG 12-Lead (Completed)   Persistent atrial fibrillation (HCC) - Primary   Relevant Medications   ELIQUIS 5 MG TABS tablet   furosemide (LASIX) 20 MG tablet   Other Relevant Orders   EKG 12-Lead (Completed)     Other   Iron deficiency anemia   Relevant Medications   cyanocobalamin 100 MCG tablet   Other Relevant Orders   EKG 12-Lead (Completed)   Syncope and collapse   Other Visit Diagnoses       Hyperlipidemia, mixed       Relevant Medications   ELIQUIS 5 MG TABS tablet   furosemide (LASIX) 20 MG tablet     Orthostatic hypotension       Relevant Medications   ELIQUIS 5 MG TABS tablet   furosemide (LASIX) 20 MG tablet      Paroxysmal atrial fibrillation Prior history cardioversion Timing of recurrence of the atrial fibrillation unclear, EKG January 2025 while hospitalized in [place] showing atrial fibrillation Appears atrial fibrillation has persisted, remains in atrial fibrillation today likely contributing to shortness of breath and wheezing Recommend reload amiodarone 400 twice daily 1 week then down to 200 twice daily EKG in 2 weeks, if she remains in atrial fibrillation would consider cardioversion  Orthostatic hypotension Long history of orthostasis On fludrocortisone Recommend she follow  midodrine regimen as below For systolic pressure <150, take midodrine 10 mg  For systolic pressure >150 to 160, take midodire 5 mg For systolic pressure >160, hold midodrine  Anemia On Eliquis, hemoglobin stable  Diastolic CHF In the setting of recurrent atrial fibrillation Recommend she increase Lasix up to 20 mg 3 times daily with extra Lasix as needed  Signed, Dossie Arbour, M.D., Ph.D. [facility] [place], [place] [phone]

## 2024-02-10 ENCOUNTER — Ambulatory Visit: Attending: Cardiovascular Disease | Admitting: Cardiovascular Disease

## 2024-02-10 ENCOUNTER — Encounter: Payer: Self-pay | Admitting: Cardiovascular Disease

## 2024-02-10 VITALS — BP 140/70 | HR 88 | Ht 67.0 in | Wt 176.2 lb

## 2024-02-10 DIAGNOSIS — I5032 Chronic diastolic (congestive) heart failure: Secondary | ICD-10-CM | POA: Diagnosis present

## 2024-02-10 DIAGNOSIS — D509 Iron deficiency anemia, unspecified: Secondary | ICD-10-CM | POA: Diagnosis not present

## 2024-02-10 DIAGNOSIS — E782 Mixed hyperlipidemia: Secondary | ICD-10-CM

## 2024-02-10 DIAGNOSIS — R55 Syncope and collapse: Secondary | ICD-10-CM | POA: Diagnosis present

## 2024-02-10 DIAGNOSIS — I951 Orthostatic hypotension: Secondary | ICD-10-CM

## 2024-02-10 DIAGNOSIS — I4819 Other persistent atrial fibrillation: Secondary | ICD-10-CM | POA: Diagnosis not present

## 2024-02-10 MED ORDER — AMIODARONE HCL 200 MG PO TABS: ORAL_TABLET | ORAL | 3 refills | Status: DC

## 2024-02-10 MED ORDER — FUROSEMIDE 20 MG PO TABS: 20.0000 mg | ORAL_TABLET | ORAL | 1 refills | Status: AC

## 2024-02-10 NOTE — Patient Instructions (Addendum)
 Medication Instructions:  Amiodarone 400 mg twice a day for 1 week Then 200 MG  twice a day  EKG in 2 weeks If still in atrial fibrillation , we will schedule a cardioversion  Lasix 20 mg three days a week with extra as needed  If you need a refill on your cardiac medications before your next appointment, please call your pharmacy.   Lab work: No new labs needed  Testing/Procedures: No new testing needed  Follow-Up: At [facility], you and your health needs are our priority.  As part of our continuing mission to provide you with exceptional heart care, we have created designated Provider Care Teams.  These Care Teams include your primary Cardiologist (physician) and Advanced Practice Providers (APPs -  Physician Assistants and Nurse Practitioners) who all work together to provide you with the care you need, when you need it.  You will need a follow up appointment in 2-3 weeks with Mariah Milling or APP  Providers on your designated Care Team:   Nicolasa Ducking, NP Eula Listen, PA-C Cadence Fransico Michael, [place]  COVID-19 Vaccine Information can be found at: [email or web address] For questions related to vaccine distribution or appointments, please email [email or web address] or call [phone].

## 2024-02-11 ENCOUNTER — Ambulatory Visit: Payer: Medicare Other | Admitting: Student

## 2024-02-24 ENCOUNTER — Ambulatory Visit: Admitting: Cardiology

## 2024-02-25 ENCOUNTER — Encounter: Payer: Self-pay | Admitting: Ophthalmology

## 2024-02-25 NOTE — Anesthesia Preprocedure Evaluation (Addendum)
 Anesthesia Evaluation  Patient identified by MRN, date of birth, ID band Patient awake    Reviewed: Allergy & Precautions, H&P , NPO status , Patient's Chart, lab work & pertinent test results  Airway Mallampati: I  TM Distance: >3 FB Neck ROM: Full    Dental no notable dental hx. (+) Upper Dentures, Edentulous Lower   Pulmonary neg pulmonary ROS, pneumonia, COPD, former smoker   Pulmonary exam normal breath sounds clear to auscultation       Cardiovascular hypertension, + angina  + CAD and +CHF  negative cardio ROS Normal cardiovascular exam Rhythm:Regular Rate:Normal   06-01-23 1. Left ventricular ejection fraction, by estimation, is 60 to 65%. The  left ventricle has normal function. The left ventricle has no regional  wall motion abnormalities. There is mild left ventricular hypertrophy.  Left ventricular diastolic parameters  are indeterminate.   2. Right ventricular systolic function is normal. The right ventricular  size is normal. There is normal pulmonary artery systolic pressure.   3. Left atrial size was moderately dilated.   4. The mitral valve is normal in structure. Mild to moderate mitral valve  regurgitation. No evidence of mitral stenosis.   5. The aortic valve is normal in structure. Aortic valve regurgitation is  not visualized. Mild aortic valve stenosis. Aortic valve area, by VTI  measures 1.99 cm. Aortic valve mean gradient measures 8.0 mmHg.   6. The inferior vena cava is normal in size with greater than 50%  respiratory variability, suggesting right atrial pressure of 3 mmHg.    01-23-24 ER note, syncope and collapse, hx syncopal episodes  EKG EKG independently reviewed interpreted by myself (ER attending) demonstrates:  EKG demonstrates A-fib at a rate of 82, QRS 94, QTc 509, no acute ST changes    Neuro/Psych TIA Neuromuscular disease negative neurological ROS  negative psych ROS    GI/Hepatic negative GI ROS, Neg liver ROS,GERD  ,,  Endo/Other  negative endocrine ROS    Renal/GU Renal diseasenegative Renal ROS  negative genitourinary   Musculoskeletal negative musculoskeletal ROS (+)    Abdominal   Peds negative pediatric ROS (+)  Hematology negative hematology ROS (+) Blood dyscrasia, anemia   Anesthesia Other Findings Hypertension  Paroxysmal A-fib (HCC) COPD (chronic obstructive pulmonary disease) (HCC)  TIA (transient ischemic attack) GERD (gastroesophageal reflux disease) Wears dentures Wears hearing aid in both ears CHF (congestive heart failure)  Orthostatic hypotension  Chronic diastolic (congestive) heart failure  Chronic orthostatic hypotension  Stable angina pectoris (HCC) Benign paroxysmal positional vertigo Trigeminal neuralgia Stage 3a chronic kidney disease (CKD) (HCC) Dry eyes Hepatic steatosis  History of GI bleed Mild ot moderate mitral regurgitation Mild aortic stenosis Mild [facility]  01-23-24 ER noteBeverly Edwards is a 89 year old female with history of CHF, orthostatic hypotension, A-fib, anemia presenting to the emergency department for evaluation of syncope.  Accompanied by daughter provides collateral history.  Reports that patient has a longstanding history of infrequent syncopal episodes associated with orthostasis.  About a week ago, was started on Lasix, Levaquin, steroids in the setting of wheezing and concerns for fluid overload.  Has lost about a pound a day since that time, unknown goal.  Yesterday, when patient was walking she had a witnessed syncopal episode, had a second syncopal episode while sitting on the toilet earlier today, and a third later this morning.  No falls or head trauma.  Patient has noticed that since yesterday, her left leg has been bothering her.  Initially reported as "numb", but in  asking further questions it sounds more like a heaviness with intact sensation that is symmetric compared to the other side.   Arm and face are not affected.      Reproductive/Obstetrics negative OB ROS                             Anesthesia Physical Anesthesia Plan  ASA: 3  Anesthesia Plan: MAC   Post-op Pain Management:    Induction: Intravenous  PONV Risk Score and Plan:   Airway Management Planned: Natural Airway and Nasal Cannula  Additional Equipment:   Intra-op Plan:   Post-operative Plan:   Informed Consent: I have reviewed the patients History and Physical, chart, labs and discussed the procedure including the risks, benefits and alternatives for the proposed anesthesia with the patient or authorized representative who has indicated his/her understanding and acceptance.     Dental Advisory Given  Plan Discussed with: Anesthesiologist, CRNA and Surgeon  Anesthesia Plan Comments: (Patient consented for risks of anesthesia including but not limited to:  - adverse reactions to medications - damage to eyes, teeth, lips or other oral mucosa - nerve damage due to positioning  - sore throat or hoarseness - Damage to heart, brain, nerves, lungs, other parts of body or loss of life  Patient voiced understanding and assent.)        Anesthesia Quick Evaluation

## 2024-02-27 NOTE — Discharge Instructions (Signed)

## 2024-03-01 ENCOUNTER — Encounter: Payer: Self-pay | Admitting: Ophthalmology

## 2024-03-01 ENCOUNTER — Encounter: Payer: Self-pay | Admitting: Cardiology

## 2024-03-01 ENCOUNTER — Ambulatory Visit: Attending: Cardiology | Admitting: Cardiology

## 2024-03-01 VITALS — BP 82/40 | HR 78 | Ht 67.0 in | Wt 176.8 lb

## 2024-03-01 DIAGNOSIS — D509 Iron deficiency anemia, unspecified: Secondary | ICD-10-CM | POA: Diagnosis not present

## 2024-03-01 DIAGNOSIS — I951 Orthostatic hypotension: Secondary | ICD-10-CM | POA: Diagnosis present

## 2024-03-01 DIAGNOSIS — E782 Mixed hyperlipidemia: Secondary | ICD-10-CM

## 2024-03-01 DIAGNOSIS — I4819 Other persistent atrial fibrillation: Secondary | ICD-10-CM | POA: Diagnosis not present

## 2024-03-01 DIAGNOSIS — I5032 Chronic diastolic (congestive) heart failure: Secondary | ICD-10-CM | POA: Diagnosis present

## 2024-03-01 NOTE — Patient Instructions (Signed)
 Medication Instructions:  NO CHANGES   Lab Work: CBC AND BMET TO BE DONE TODAY   Testing/Procedures:    Dear Tricia Edwards  You are scheduled for a Cardioversion on Wednesday, April 30 with Dr. Belva Boyden, MD.  Please arrive at the [facility] Entrance of [facility], [address] [place], [place] [address] at 6:30 AM (This is 1 hour(s) prior to your procedure time).  Proceed to the Check-In Desk directly inside the entrance.  Procedure Parking: Use the entrance off of the [address] side of the hospital. Turn right upon entering and follow the driveway to parking that is directly in front of the [facility] Vascular Center. There is no valet parking available at this entrance, however there is an awning directly in front of the [facility] Vascular Center for drop off/ pick up for patients.   DIET:  Nothing to eat or drink after midnight except a sip of water with medications (see medication instructions below)  MEDICATION INSTRUCTIONS: !!IF ANY NEW MEDICATIONS ARE STARTED AFTER TODAY, PLEASE NOTIFY YOUR PROVIDER AS SOON AS POSSIBLE!!  FYI: Medications such as Semaglutide (Ozempic, [place]), Tirzepatide (Mounjaro, Zepbound), Dulaglutide (Trulicity), etc ("GLP1 agonists") AND Canagliflozin (Invokana), Dapagliflozin (Farxiga), Empagliflozin (Jardiance), Ertugliflozin (Steglatro), Bexagliflozin [employer]) or any combination with one of these drugs such as Invokamet (Canagliflozin/Metformin), Synjardy (Empagliflozin/Metformin), etc ("SGLT2 inhibitors") must be held around the time of a procedure. This is not a comprehensive list of all of these drugs. Please review all of your medications and talk to your provider if you take any one of these. If you are not sure, ask your provider.      Continue taking your anticoagulant (blood thinner): Apixaban (Eliquis).  You will need to continue this after your procedure until you are told by your provider that it is safe to stop.    FYI:  For  your safety, and to allow us  to monitor your vital signs accurately during the surgery/procedure we request: If you have artificial nails, gel coating, SNS etc, please have those removed prior to your surgery/procedure. Not having the nail coverings /polish removed may result in cancellation or delay of your surgery/procedure.  Your support person will be asked to wait in the waiting room during your procedure.  It is OK to have someone drop you off and come back when you are ready to be discharged.  You cannot drive after the procedure and will need someone to drive you home.  Bring your insurance cards.  *Special Note: Every effort is made to have your procedure done on time. Occasionally there are emergencies that occur at the hospital that may cause delays. Please be patient if a delay does occur.      Follow-Up: At [facility], you and your health needs are our priority.  As part of our continuing mission to provide you with exceptional heart care, our providers are all part of one team.  This team includes your primary Cardiologist (physician) and Advanced Practice Providers or APPs (Physician Assistants and Nurse Practitioners) who all work together to provide you with the care you need, when you need it.  Your next appointment:   2-3 WEEKS POST CARDIOVERSION  Provider:   Ronald Cockayne, NP

## 2024-03-01 NOTE — H&P (View-Only) (Signed)
 Cardiology Office Note:  .   Date:  03/01/2024  ID:  Tricia Edwards, DOB 09-20-1930, MRN [id number] PCP: [facility]  [facility] Providers Cardiologist:  None    History of Present Illness: .   Tricia Edwards is a 89 y.o. female with past medical history of persistent atrial fibrillation status post cardioversion (08/2021) and MI, orthostatic hypotension with recurrent syncope, chronic HFpEF, hypertension, hyperlipidemia, who is being seen today for follow-up of HFpEF and atrial fibrillation.   Patient long history of orthostasis managed by cardiology symmetric juices on high-dose Florinef with midodrine 3 times daily.  She was admitted in 12/2021 for syncope felt to be vasovagal versus A-fib RVR with drops of blood pressure.  Upon evaluation by cardiology she was back in normal sinus rhythm she had mild troponin elevation felt to be from A-fib RVR with suspected underlying ischemia.  Amiodarone and digoxin were held.  Family did not want to pursue ischemic testing.  She was seen in 04/2022 and denied any recurrent episodes of near syncope or syncope.  She was taking Florinef 0.1 mg twice daily and midodrine 10 mg 3 times daily.  She had been wearing thigh-high compression stockings, an abdominal binder and staying hydrated.  Unfortunately she was readmitted in July 2023 for atrial fibrillation with RVR, syncope, and acute heart failure.  It was felt heart failure was exacerbated due to A-fib rates.  She was initially placed on Cardizem with resolution of A-fib.  She was given 1 dose of Lasix 20 mg.  She was in the emergency department 10/23 for dizziness and atrial fibrillation.  Pulse was reported at 120-160 bpm with a blood pressure 120/92.  EKG showed A-fib with a heart rate of 121.  She was given an injection of cortisone 5 mg and diltiazem tablet 30 mg.  Dr. Theodis Edwards was consulted and recommended Cardizem 30 mg twice a day.  She was seen in the clinic 09/06/2022 and was  doing well.  She did continue taking Cardizem 30 mg 3 times a day and appears to be in atrial fibrillation.  EKG demonstrated A-fib at a rate of 77 on that visit.  She was continued on apixaban 5 mg twice daily.  Cardizem was stopped and she was loaded with amiodarone 400 mg twice daily for 1 week then 200 mg twice daily x 1 week and then 200 mg after.  She was then scheduled for follow-up with Dr. Gollan.  She was evaluated in the emergency department in December for wrist pain and hypertension.  Blood pressure was found to be 192/95, pulse of 61, respirations of 18, temperature 98.3.  She was released home.  She presented back to the [facility] emergency department on 01/23/2024 with complaints of near syncope.  She had 3 episodes per family where she had fallen out.  Patient complained of left-sided numbness with a history of atrial fibrillation.  Blood pressure was found to be 113/78, pulse of 58, respirations of 19, temperature of 98.  Pertinent labs revealed hemoglobin of 9.8, potassium 3.3, serum creatinine 1.24, calcium 8.6, BNP of 506.1.  She was treated with 500 cc normal saline bolus and discharged home.   She was last seen in clinic 02/10/2024 by Dr. Gollan.  She had been having issues with shortness of breath and with wheezing and was subsequently started on furosemide by her primary care provider.  Admitted to atrial fibrillation had persistent and she remained in atrial fibrillation during that visit as well.  It was  recommended she restart amiodarone 400 mg twice daily for 1 week and then down to 200 mg twice daily.  EKG in 2 weeks and if she remained in atrial fibrillation would consider repeat cardioversion.  She also has longstanding history of orthostatic hypotension with taking midodrine on a sliding scale.  There was no further testing that was ordered or medication changes that were made.  She returns to clinic today accompanied by her daughter states that overall she has been doing well.  She  continues to complain of fatigue but states that her wheezing has improved.  She is scheduled for cataract surgery tomorrow to the left eye as she had the right eye done in 2016.  She has not had to stop any of her apixaban prior to her procedure.  Denies any bleeding with no blood noted in her urine or stool.  She does endorses shortness of breath with accompanying fatigue and has lower blood pressures today.  Continues to have compression therapy all night.  Has not had any recent emergency department visits or hospitalizations.  ROS: 10 point review of system has been reviewed and considered negative except ones been listed in the HPI  Studies Reviewed: Tricia Edwards   EKG Interpretation Date/Time:  Monday March 01 2024 13:59:16 EDT Ventricular Rate:  78 PR Interval:    QRS Duration:  96 QT Interval:  354 QTC Calculation: 403 R Axis:   -23  Text Interpretation: Atrial fibrillation Minimal voltage criteria for LVH, may be normal variant ( Cornell product ) Nonspecific T wave abnormality When compared with ECG of 10-Feb-2024 14:57, No significant change was found Confirmed by Tricia Edwards ([id number]) on 03/01/2024 2:01:31 PM    2D echo 06/01/2023 1. Left ventricular ejection fraction, by estimation, is 60 to 65%. The  left ventricle has normal function. The left ventricle has no regional  wall motion abnormalities. There is mild left ventricular hypertrophy.  Left ventricular diastolic parameters  are indeterminate.   2. Right ventricular systolic function is normal. The right ventricular  size is normal. There is normal pulmonary artery systolic pressure.   3. Left atrial size was moderately dilated.   4. The mitral valve is normal in structure. Mild to moderate mitral valve  regurgitation. No evidence of mitral stenosis.   5. The aortic valve is normal in structure. Aortic valve regurgitation is  not visualized. Mild aortic valve stenosis. Aortic valve area, by VTI  measures 1.99 cm. Aortic valve  mean gradient measures 8.0 mmHg.   6. The inferior vena cava is normal in size with greater than 50%  respiratory variability, suggesting right atrial pressure of 3 mmHg.   2D echo 01/18/2021 1. Left ventricular ejection fraction, by estimation, is 60 to 65%. The  left ventricle has normal function. The left ventricle has no regional  wall motion abnormalities. There is moderate left ventricular hypertrophy.  Left ventricular diastolic  parameters are consistent with Grade I diastolic dysfunction (impaired  relaxation).   2. Right ventricular systolic function is normal. The right ventricular  size is normal.   3. Left atrial size was mildly dilated.   Risk Assessment/Calculations:    CHA2DS2-VASc Score = 5   This indicates a 7.2% annual risk of stroke. The patient's score is based upon: CHF History: 1 HTN History: 1 Diabetes History: 0 Stroke History: 0 Vascular Disease History: 0 Age Score: 2 Gender Score: 1            Physical Exam:   VS:  BP Tricia Edwards)  82/40   Pulse 78   Ht 5\' 7"  (1.702 m)   Wt 176 lb 12.8 oz (80.2 kg)   SpO2 93%   BMI 27.69 kg/m    Wt Readings from Last 3 Encounters:  03/01/24 176 lb 12.8 oz (80.2 kg)  02/10/24 176 lb 4 oz (79.9 kg)  11/01/23 175 lb (79.4 kg)    GEN: Well nourished, well developed in no acute distress NECK: No JVD; No carotid bruits CARDIAC: IR IR, no murmurs, rubs, gallops RESPIRATORY:  Clear to auscultation without rales, wheezing or rhonchi  ABDOMEN: Soft, non-tender, non-distended EXTREMITIES:  No edema, compression stockings on bilaterally; No deformity   ASSESSMENT AND PLAN: .   Persistent atrial fibrillation with rate controlled atrial fibrillation noted on EKG today that is rate controlled with rate 78, LVH, nonspecific T wave abnormality with no acute changes noted.  Previously she had discussed cardioversion procedure with her primary cardiologist Dr. Gollan.  With her continued to be symptomatic and not missed any doses of  her apixaban she has been scheduled for cardioversion procedure the week/28/25 with Dr. Gollan.  She is continued on amiodarone 200 mg twice daily, apixaban 5 mg twice daily for CHA2DS2-VASc score of at least 5 for stroke prophylaxis.  She will also be sent for preprocedure labs with CBC and a BMP.  Orthostatic hypotension with a blood pressure today of 82/40 in the repeat of 88/64.  Patient just had her Florinef and her midodrine prior to arrival to her appointment today.  She has sliding scale for her monitoring but is maintained on Florinef 0.1 mg 200 mcg twice daily and midodrine 10 mg 3 times daily she also has furosemide 20 mg Monday Wednesday and Friday as needed.  Encouraged to continue to monitor pressures 1 to 2 hours postmedication administration as well.  Chronic diastolic congestive heart failure likely in the setting of recurrent atrial fibrillation.  Last echocardiogram was completed in 05/2023 with an EF of 60 to 65%, no RWMA, mild LVH, mild MR, mild aortic valve stenosis with aortic valve area by VTI measuring 1.99 cm and aortic valve gradient of 8 mmHg.  Unable to initiate MRA therapy or SGLT2 inhibitor due to orthostatic hypotension.  She is only on furosemide 20 mg as needed.  Appears to be euvolemic on exam today and notes an improvement in shortness of breath and wheezing.  Iron deficiency anemia with a stable hemoglobin.  Ongoing management per PCP.  Mixed hyperlipidemia where she is continued on atorvastatin 10 mg daily.  Last LDL was 71.  Ongoing management per PCP.    Informed Consent   Shared Decision Making/Informed Consent The risks (stroke, cardiac arrhythmias rarely resulting in the need for a temporary or permanent pacemaker, skin irritation or burns and complications associated with conscious sedation including aspiration, arrhythmia, respiratory failure and death), benefits (restoration of normal sinus rhythm) and alternatives of a direct current cardioversion were  explained in detail to Ms. Glasco and she agrees to proceed.       Dispo: Patient return to clinic to see MD/APP 1 to 3 weeks postprocedure with EKG on return or sooner if needed.  Signed, Million Maharaj, NP

## 2024-03-01 NOTE — Progress Notes (Signed)
 Cardiology Office Note:  .   Date:  03/01/2024  ID:  [name], DOB 09-20-1930, MRN [id number] PCP: [facility]  [facility] Providers Cardiologist:  None    History of Present Illness: .   Janesha Brissette is a 89 y.o. female with past medical history of persistent atrial fibrillation status post cardioversion (08/2021) and MI, orthostatic hypotension with recurrent syncope, chronic HFpEF, hypertension, hyperlipidemia, who is being seen today for follow-up of HFpEF and atrial fibrillation.   Patient long history of orthostasis managed by cardiology symmetric juices on high-dose Florinef with midodrine 3 times daily.  She was admitted in 12/2021 for syncope felt to be vasovagal versus A-fib RVR with drops of blood pressure.  Upon evaluation by cardiology she was back in normal sinus rhythm she had mild troponin elevation felt to be from A-fib RVR with suspected underlying ischemia.  Amiodarone and digoxin were held.  Family did not want to pursue ischemic testing.  She was seen in 04/2022 and denied any recurrent episodes of near syncope or syncope.  She was taking Florinef 0.1 mg twice daily and midodrine 10 mg 3 times daily.  She had been wearing thigh-high compression stockings, an abdominal binder and staying hydrated.  Unfortunately she was readmitted in July 2023 for atrial fibrillation with RVR, syncope, and acute heart failure.  It was felt heart failure was exacerbated due to A-fib rates.  She was initially placed on Cardizem with resolution of A-fib.  She was given 1 dose of Lasix 20 mg.  She was in the emergency department 10/23 for dizziness and atrial fibrillation.  Pulse was reported at 120-160 bpm with a blood pressure 120/92.  EKG showed A-fib with a heart rate of 121.  She was given an injection of cortisone 5 mg and diltiazem tablet 30 mg.  Dr. Theodis Fiscal was consulted and recommended Cardizem 30 mg twice a day.  She was seen in the clinic 09/06/2022 and was  doing well.  She did continue taking Cardizem 30 mg 3 times a day and appears to be in atrial fibrillation.  EKG demonstrated A-fib at a rate of 77 on that visit.  She was continued on apixaban 5 mg twice daily.  Cardizem was stopped and she was loaded with amiodarone 400 mg twice daily for 1 week then 200 mg twice daily x 1 week and then 200 mg after.  She was then scheduled for follow-up with Dr. Gollan.  She was evaluated in the emergency department in December for wrist pain and hypertension.  Blood pressure was found to be 192/95, pulse of 61, respirations of 18, temperature 98.3.  She was released home.  She presented back to the [facility] emergency department on 01/23/2024 with complaints of near syncope.  She had 3 episodes per family where she had fallen out.  Patient complained of left-sided numbness with a history of atrial fibrillation.  Blood pressure was found to be 113/78, pulse of 58, respirations of 19, temperature of 98.  Pertinent labs revealed hemoglobin of 9.8, potassium 3.3, serum creatinine 1.24, calcium 8.6, BNP of 506.1.  She was treated with 500 cc normal saline bolus and discharged home.   She was last seen in clinic 02/10/2024 by Dr. Gollan.  She had been having issues with shortness of breath and with wheezing and was subsequently started on furosemide by her primary care provider.  Admitted to atrial fibrillation had persistent and she remained in atrial fibrillation during that visit as well.  It was  recommended she restart amiodarone 400 mg twice daily for 1 week and then down to 200 mg twice daily.  EKG in 2 weeks and if she remained in atrial fibrillation would consider repeat cardioversion.  She also has longstanding history of orthostatic hypotension with taking midodrine on a sliding scale.  There was no further testing that was ordered or medication changes that were made.  She returns to clinic today accompanied by her daughter states that overall she has been doing well.  She  continues to complain of fatigue but states that her wheezing has improved.  She is scheduled for cataract surgery tomorrow to the left eye as she had the right eye done in 2016.  She has not had to stop any of her apixaban prior to her procedure.  Denies any bleeding with no blood noted in her urine or stool.  She does endorses shortness of breath with accompanying fatigue and has lower blood pressures today.  Continues to have compression therapy all night.  Has not had any recent emergency department visits or hospitalizations.  ROS: 10 point review of system has been reviewed and considered negative except ones been listed in the HPI  Studies Reviewed: Aaron Aas   EKG Interpretation Date/Time:  Monday March 01 2024 13:59:16 EDT Ventricular Rate:  78 PR Interval:    QRS Duration:  96 QT Interval:  354 QTC Calculation: 403 R Axis:   -23  Text Interpretation: Atrial fibrillation Minimal voltage criteria for LVH, may be normal variant ( Cornell product ) Nonspecific T wave abnormality When compared with ECG of 10-Feb-2024 14:57, No significant change was found Confirmed by Ronald Cockayne ([id number]) on 03/01/2024 2:01:31 PM    2D echo 06/01/2023 1. Left ventricular ejection fraction, by estimation, is 60 to 65%. The  left ventricle has normal function. The left ventricle has no regional  wall motion abnormalities. There is mild left ventricular hypertrophy.  Left ventricular diastolic parameters  are indeterminate.   2. Right ventricular systolic function is normal. The right ventricular  size is normal. There is normal pulmonary artery systolic pressure.   3. Left atrial size was moderately dilated.   4. The mitral valve is normal in structure. Mild to moderate mitral valve  regurgitation. No evidence of mitral stenosis.   5. The aortic valve is normal in structure. Aortic valve regurgitation is  not visualized. Mild aortic valve stenosis. Aortic valve area, by VTI  measures 1.99 cm. Aortic valve  mean gradient measures 8.0 mmHg.   6. The inferior vena cava is normal in size with greater than 50%  respiratory variability, suggesting right atrial pressure of 3 mmHg.   2D echo 01/18/2021 1. Left ventricular ejection fraction, by estimation, is 60 to 65%. The  left ventricle has normal function. The left ventricle has no regional  wall motion abnormalities. There is moderate left ventricular hypertrophy.  Left ventricular diastolic  parameters are consistent with Grade I diastolic dysfunction (impaired  relaxation).   2. Right ventricular systolic function is normal. The right ventricular  size is normal.   3. Left atrial size was mildly dilated.   Risk Assessment/Calculations:    CHA2DS2-VASc Score = 5   This indicates a 7.2% annual risk of stroke. The patient's score is based upon: CHF History: 1 HTN History: 1 Diabetes History: 0 Stroke History: 0 Vascular Disease History: 0 Age Score: 2 Gender Score: 1            Physical Exam:   VS:  BP Aaron Aas)  82/40   Pulse 78   Ht 5\' 7"  (1.702 m)   Wt 176 lb 12.8 oz (80.2 kg)   SpO2 93%   BMI 27.69 kg/m    Wt Readings from Last 3 Encounters:  03/01/24 176 lb 12.8 oz (80.2 kg)  02/10/24 176 lb 4 oz (79.9 kg)  11/01/23 175 lb (79.4 kg)    GEN: Well nourished, well developed in no acute distress NECK: No JVD; No carotid bruits CARDIAC: IR IR, no murmurs, rubs, gallops RESPIRATORY:  Clear to auscultation without rales, wheezing or rhonchi  ABDOMEN: Soft, non-tender, non-distended EXTREMITIES:  No edema, compression stockings on bilaterally; No deformity   ASSESSMENT AND PLAN: .   Persistent atrial fibrillation with rate controlled atrial fibrillation noted on EKG today that is rate controlled with rate 78, LVH, nonspecific T wave abnormality with no acute changes noted.  Previously she had discussed cardioversion procedure with her primary cardiologist Dr. Gollan.  With her continued to be symptomatic and not missed any doses of  her apixaban she has been scheduled for cardioversion procedure the week/28/25 with Dr. Gollan.  She is continued on amiodarone 200 mg twice daily, apixaban 5 mg twice daily for CHA2DS2-VASc score of at least 5 for stroke prophylaxis.  She will also be sent for preprocedure labs with CBC and a BMP.  Orthostatic hypotension with a blood pressure today of 82/40 in the repeat of 88/64.  Patient just had her Florinef and her midodrine prior to arrival to her appointment today.  She has sliding scale for her monitoring but is maintained on Florinef 0.1 mg 200 mcg twice daily and midodrine 10 mg 3 times daily she also has furosemide 20 mg Monday Wednesday and Friday as needed.  Encouraged to continue to monitor pressures 1 to 2 hours postmedication administration as well.  Chronic diastolic congestive heart failure likely in the setting of recurrent atrial fibrillation.  Last echocardiogram was completed in 05/2023 with an EF of 60 to 65%, no RWMA, mild LVH, mild MR, mild aortic valve stenosis with aortic valve area by VTI measuring 1.99 cm and aortic valve gradient of 8 mmHg.  Unable to initiate MRA therapy or SGLT2 inhibitor due to orthostatic hypotension.  She is only on furosemide 20 mg as needed.  Appears to be euvolemic on exam today and notes an improvement in shortness of breath and wheezing.  Iron deficiency anemia with a stable hemoglobin.  Ongoing management per PCP.  Mixed hyperlipidemia where she is continued on atorvastatin 10 mg daily.  Last LDL was 71.  Ongoing management per PCP.    Informed Consent   Shared Decision Making/Informed Consent The risks (stroke, cardiac arrhythmias rarely resulting in the need for a temporary or permanent pacemaker, skin irritation or burns and complications associated with conscious sedation including aspiration, arrhythmia, respiratory failure and death), benefits (restoration of normal sinus rhythm) and alternatives of a direct current cardioversion were  explained in detail to Ms. Glasco and she agrees to proceed.       Dispo: Patient return to clinic to see MD/APP 1 to 3 weeks postprocedure with EKG on return or sooner if needed.  Signed, Million Maharaj, NP

## 2024-03-02 ENCOUNTER — Ambulatory Visit: Payer: Self-pay | Admitting: Anesthesiology

## 2024-03-02 ENCOUNTER — Encounter
Admission: RE | Disposition: A | Discharge status: Home / Self Care | Payer: Self-pay | Source: Home / Self Care | Attending: Ophthalmology

## 2024-03-02 ENCOUNTER — Encounter: Payer: Self-pay | Admitting: Ophthalmology

## 2024-03-02 ENCOUNTER — Other Ambulatory Visit: Payer: Self-pay

## 2024-03-02 ENCOUNTER — Ambulatory Visit
Admission: RE | Admit: 2024-03-02 | Discharge: 2024-03-02 | Disposition: A | Discharge status: Home / Self Care | Attending: Ophthalmology | Admitting: Ophthalmology

## 2024-03-02 DIAGNOSIS — K219 Gastro-esophageal reflux disease without esophagitis: Secondary | ICD-10-CM | POA: Diagnosis not present

## 2024-03-02 DIAGNOSIS — I25119 Atherosclerotic heart disease of native coronary artery with unspecified angina pectoris: Secondary | ICD-10-CM | POA: Insufficient documentation

## 2024-03-02 DIAGNOSIS — I080 Rheumatic disorders of both mitral and aortic valves: Secondary | ICD-10-CM | POA: Diagnosis not present

## 2024-03-02 DIAGNOSIS — Z87891 Personal history of nicotine dependence: Secondary | ICD-10-CM | POA: Insufficient documentation

## 2024-03-02 DIAGNOSIS — I480 Paroxysmal atrial fibrillation: Secondary | ICD-10-CM | POA: Insufficient documentation

## 2024-03-02 DIAGNOSIS — Z79899 Other long term (current) drug therapy: Secondary | ICD-10-CM | POA: Insufficient documentation

## 2024-03-02 DIAGNOSIS — D631 Anemia in chronic kidney disease: Secondary | ICD-10-CM | POA: Insufficient documentation

## 2024-03-02 DIAGNOSIS — N1831 Chronic kidney disease, stage 3a: Secondary | ICD-10-CM | POA: Diagnosis not present

## 2024-03-02 DIAGNOSIS — I5032 Chronic diastolic (congestive) heart failure: Secondary | ICD-10-CM | POA: Diagnosis not present

## 2024-03-02 DIAGNOSIS — J449 Chronic obstructive pulmonary disease, unspecified: Secondary | ICD-10-CM | POA: Insufficient documentation

## 2024-03-02 DIAGNOSIS — I130 Hypertensive heart and chronic kidney disease with heart failure and stage 1 through stage 4 chronic kidney disease, or unspecified chronic kidney disease: Secondary | ICD-10-CM | POA: Insufficient documentation

## 2024-03-02 DIAGNOSIS — Z7901 Long term (current) use of anticoagulants: Secondary | ICD-10-CM | POA: Insufficient documentation

## 2024-03-02 DIAGNOSIS — H2512 Age-related nuclear cataract, left eye: Secondary | ICD-10-CM | POA: Diagnosis present

## 2024-03-02 HISTORY — DX: Personal history of other specified conditions: Z87.898

## 2024-03-02 HISTORY — DX: Chronic kidney disease, stage 3a: N18.31

## 2024-03-02 HISTORY — DX: Gastro-esophageal reflux disease without esophagitis: K21.9

## 2024-03-02 HISTORY — DX: Fatty (change of) liver, not elsewhere classified: K76.0

## 2024-03-02 HISTORY — DX: Presence of external hearing-aid: Z97.4

## 2024-03-02 HISTORY — DX: Presence of dental prosthetic device (complete) (partial): Z97.2

## 2024-03-02 HISTORY — DX: Orthostatic hypotension: I95.1

## 2024-03-02 HISTORY — DX: Cardiomegaly: I51.7

## 2024-03-02 HISTORY — DX: Heart failure, unspecified: I50.9

## 2024-03-02 HISTORY — DX: Nonrheumatic aortic (valve) stenosis: I35.0

## 2024-03-02 HISTORY — DX: Chronic diastolic (congestive) heart failure: I50.32

## 2024-03-02 HISTORY — DX: Trigeminal neuralgia: G50.0

## 2024-03-02 HISTORY — DX: Nonrheumatic mitral (valve) insufficiency: I34.0

## 2024-03-02 HISTORY — DX: Benign paroxysmal vertigo, unspecified ear: H81.10

## 2024-03-02 HISTORY — DX: Other forms of angina pectoris: I20.89

## 2024-03-02 HISTORY — PX: CATARACT EXTRACTION W/PHACO: SHX586

## 2024-03-02 HISTORY — DX: Dry eye syndrome of bilateral lacrimal glands: H04.123

## 2024-03-02 HISTORY — DX: Transient cerebral ischemic attack, unspecified: G45.9

## 2024-03-02 HISTORY — DX: Personal history of other diseases of the digestive system: Z87.19

## 2024-03-02 LAB — BASIC METABOLIC PANEL WITH GFR
BUN/Creatinine Ratio: 18 (ref 12–28)
BUN: 24 mg/dL (ref 10–36)
CO2: 28 mmol/L (ref 20–29)
Calcium: 8.8 mg/dL (ref 8.7–10.3)
Chloride: 101 mmol/L (ref 96–106)
Creatinine, Ser: 1.33 mg/dL — ABNORMAL HIGH (ref 0.57–1.00)
Glucose: 137 mg/dL — ABNORMAL HIGH (ref 70–99)
Potassium: 4.2 mmol/L (ref 3.5–5.2)
Sodium: 141 mmol/L (ref 134–144)
eGFR: 38 mL/min/{1.73_m2} — ABNORMAL LOW (ref >59)

## 2024-03-02 LAB — CBC
Hematocrit: 33.9 % — ABNORMAL LOW (ref 34.0–46.6)
Hemoglobin: 10.9 g/dL — ABNORMAL LOW (ref 11.1–15.9)
MCH: 30.4 pg (ref 26.6–33.0)
MCHC: 32.2 g/dL (ref 31.5–35.7)
MCV: 94 fL (ref 79–97)
Platelets: 221 10*3/uL (ref 150–450)
RBC: 3.59 x10E6/uL — ABNORMAL LOW (ref 3.77–5.28)
RDW: 12.4 % (ref 11.7–15.4)
WBC: 4.9 10*3/uL (ref 3.4–10.8)

## 2024-03-02 SURGERY — PHACOEMULSIFICATION, CATARACT, WITH IOL INSERTION
Anesthesia: Monitor Anesthesia Care | Site: Eye | Laterality: Left

## 2024-03-02 MED ORDER — SIGHTPATH DOSE#1 BSS IO SOLN: INTRAOCULAR | Status: DC | PRN

## 2024-03-02 MED ORDER — ARMC OPHTHALMIC DILATING DROPS
1.0000 | OPHTHALMIC | Status: DC | PRN
  Administered 2024-03-02 (×3): 1 via OPHTHALMIC

## 2024-03-02 MED ORDER — MOXIFLOXACIN HCL 0.5 % OP SOLN
OPHTHALMIC | Status: DC | PRN
  Administered 2024-03-02: .2 mL via OPHTHALMIC

## 2024-03-02 MED ORDER — FENTANYL CITRATE (PF) 100 MCG/2ML IJ SOLN
INTRAMUSCULAR | Status: AC
  Filled 2024-03-02: qty 2

## 2024-03-02 MED ORDER — ARMC OPHTHALMIC DILATING DROPS
OPHTHALMIC | Status: AC
  Filled 2024-03-02: qty 0.5

## 2024-03-02 MED ORDER — MIDAZOLAM HCL 2 MG/2ML IJ SOLN
INTRAMUSCULAR | Status: AC
  Filled 2024-03-02: qty 2

## 2024-03-02 MED ORDER — FENTANYL CITRATE (PF) 100 MCG/2ML IJ SOLN
INTRAMUSCULAR | Status: DC | PRN
  Administered 2024-03-02: 50 ug via INTRAVENOUS
  Administered 2024-03-02: 25 ug via INTRAVENOUS

## 2024-03-02 MED ORDER — LIDOCAINE HCL (PF) 2 % IJ SOLN
INTRAOCULAR | Status: DC | PRN
  Administered 2024-03-02: 2 mL

## 2024-03-02 MED ORDER — BRIMONIDINE TARTRATE-TIMOLOL 0.2-0.5 % OP SOLN
OPHTHALMIC | Status: DC | PRN
  Administered 2024-03-02: 1 [drp] via OPHTHALMIC

## 2024-03-02 MED ORDER — SIGHTPATH DOSE#1 NA CHONDROIT SULF-NA HYALURON 40-17 MG/ML IO SOLN
INTRAOCULAR | Status: DC | PRN
  Administered 2024-03-02: 1 mL via INTRAOCULAR

## 2024-03-02 MED ORDER — TETRACAINE HCL 0.5 % OP SOLN
OPHTHALMIC | Status: AC
  Filled 2024-03-02: qty 4

## 2024-03-02 MED ORDER — SIGHTPATH DOSE#1 BSS IO SOLN
INTRAOCULAR | Status: DC | PRN
  Administered 2024-03-02: 15 mL via INTRAOCULAR

## 2024-03-02 MED ORDER — TETRACAINE HCL 0.5 % OP SOLN
1.0000 [drp] | OPHTHALMIC | Status: DC | PRN
  Administered 2024-03-02 (×3): 1 [drp] via OPHTHALMIC

## 2024-03-02 SURGICAL SUPPLY — 13 items
CATARACT SUITE SIGHTPATH (MISCELLANEOUS) ×1 IMPLANT
CYSTOTOME ANG REV CUT SHRT 25G (CUTTER) ×1 IMPLANT
CYSTOTOME ANGL RVRS SHRT 25G (CUTTER) ×1 IMPLANT
CYSTOTOME ANGL RVRS SHRT 25GA (CUTTER) ×1 IMPLANT
FEE CATARACT SUITE SIGHTPATH (MISCELLANEOUS) ×1 IMPLANT
GLOVE BIOGEL PI IND STRL 8 (GLOVE) ×1 IMPLANT
GLOVE SURG LX STRL 8.0 MICRO (GLOVE) ×1 IMPLANT
GLOVE SURG PROTEXIS BL SZ6.5 (GLOVE) ×1 IMPLANT
GLOVE SURG SYN 6.5 PF PI BL (GLOVE) ×1 IMPLANT
LENS IOL TECNIS EYHANCE 20.0 (Intraocular Lens) IMPLANT
NDL FILTER BLUNT 18X1 1/2 (NEEDLE) ×1 IMPLANT
NEEDLE FILTER BLUNT 18X1 1/2 (NEEDLE) ×1 IMPLANT
SYR 3ML LL SCALE MARK (SYRINGE) ×1 IMPLANT

## 2024-03-02 NOTE — Transfer of Care (Signed)
 Immediate Anesthesia Transfer of Care Note  Patient: Tricia Edwards  Procedure(s) Performed: PHACOEMULSIFICATION, CATARACT, WITH IOL INSERTION 12.23 00:58.9 (Left: Eye)  Patient Location: PACU  Anesthesia Type: MAC  Level of Consciousness: awake, alert  and patient cooperative  Airway and Oxygen Therapy: Patient Spontanous Breathing and Patient connected to supplemental oxygen  Post-op Assessment: Post-op Vital signs reviewed, Patient's Cardiovascular Status Stable, Respiratory Function Stable, Patent Airway and No signs of Nausea or vomiting  Post-op Vital Signs: Reviewed and stable  Complications: No notable events documented.

## 2024-03-02 NOTE — Op Note (Signed)
 PREOPERATIVE DIAGNOSIS:  Nuclear sclerotic cataract of the left eye.   POSTOPERATIVE DIAGNOSIS:  Nuclear sclerotic cataract of the left eye.   OPERATIVE PROCEDURE:ORPROCALL@   SURGEON:  Tricia Crews, MD.   ANESTHESIA:  Anesthesiologist: Emilie Harden, MD CRNA: Jahoo, Sonia, CRNA  1.      Managed anesthesia care. 2.     0.59ml of Shugarcaine was instilled following the paracentesis   COMPLICATIONS:  None.   TECHNIQUE:   Stop and chop   DESCRIPTION OF PROCEDURE:  The patient was examined and consented in the preoperative holding area where the aforementioned topical anesthesia was applied to the left eye and then brought back to the Operating Room where the left eye was prepped and draped in the usual sterile ophthalmic fashion and a lid speculum was placed. A paracentesis was created with the side port blade and the anterior chamber was filled with viscoelastic. A near clear corneal incision was performed with the steel keratome. A continuous curvilinear capsulorrhexis was performed with a cystotome followed by the capsulorrhexis forceps. Hydrodissection and hydrodelineation were carried out with BSS on a blunt cannula. The lens was removed in a stop and chop  technique and the remaining cortical material was removed with the irrigation-aspiration handpiece. The capsular bag was inflated with viscoelastic and the Technis [id number] lens was placed in the capsular bag without complication. The remaining viscoelastic was removed from the eye with the irrigation-aspiration handpiece. The wounds were hydrated. The anterior chamber was flushed with BSS and the eye was inflated to physiologic pressure. 0.39ml Vigamox was placed in the anterior chamber. The wounds were found to be water tight. The eye was dressed with Combigan. The patient was given protective glasses to wear throughout the day and a shield with which to sleep tonight. The patient was also given drops with which to begin a drop regimen  today and will follow-up with me in one day. Implant Name Type Inv. Item Serial No. Manufacturer Lot No. LRB No. Used Action  LENS IOL TECNIS EYHANCE 20.0 - [id number] Intraocular Lens LENS IOL TECNIS EYHANCE 20.0 [id number] [employer]  Left 1 Implanted    Procedure(s): PHACOEMULSIFICATION, CATARACT, WITH IOL INSERTION 12.23 00:58.9 (Left)  Electronically signed: Clair Edwards 03/02/2024 11:27 AM

## 2024-03-02 NOTE — Anesthesia Postprocedure Evaluation (Signed)
 Anesthesia Post Note  Patient: Tricia Edwards  Procedure(s) Performed: PHACOEMULSIFICATION, CATARACT, WITH IOL INSERTION 12.23 00:58.9 (Left: Eye)  Patient location during evaluation: PACU Anesthesia Type: MAC Level of consciousness: awake and alert Pain management: pain level controlled Vital Signs Assessment: post-procedure vital signs reviewed and stable Respiratory status: spontaneous breathing, nonlabored ventilation, respiratory function stable and patient connected to nasal cannula oxygen Cardiovascular status: stable and blood pressure returned to baseline Postop Assessment: no apparent nausea or vomiting Anesthetic complications: no   No notable events documented.   Last Vitals:  Vitals:   03/02/24 1130 03/02/24 1134  BP: (!) 161/96 (!) 162/96  Pulse: 82 85  Resp: 15 19  Temp: (!) 36.4 C 36.7 C  SpO2: 95% 95%    Last Pain:  Vitals:   03/02/24 1004  TempSrc: Temporal  PainSc: 0-No pain                 Griffith Santilli C Kiah Vanalstine

## 2024-03-02 NOTE — Progress Notes (Signed)
 Preprocedure labs remained stable.  Hemoglobin slightly improved.  Continue current medication regimen without changes needed at this time.

## 2024-03-02 NOTE — H&P (Signed)
 [facility]   Primary Care Physician:  [facility] Ophthalmologist: Dr. Druscilla Brownie  Pre-Procedure History & Physical: HPI:  Tricia Edwards is a 89 y.o. female here for cataract surgery.   Past Medical History:  Diagnosis Date   Benign paroxysmal positional vertigo    CHF (congestive heart failure) (HCC)    Chronic diastolic (congestive) heart failure (HCC)    Chronic orthostatic hypotension    COPD (chronic obstructive pulmonary disease) (HCC)    Dry eyes    GERD (gastroesophageal reflux disease)    Hepatic steatosis    History of GI bleed    Hx of syncope    Hypertension    Mild aortic stenosis by prior echocardiogram    Mild concentric left ventricular hypertrophy (LVH)    Moderate mitral regurgitation by prior echocardiogram    Orthostatic hypotension    Paroxysmal A-fib (HCC)    Stable angina pectoris (HCC)    Stage 3a chronic kidney disease (CKD) (HCC)    TIA (transient ischemic attack)    no deficits   Trigeminal neuralgia    Wears dentures    full upper and lower   Wears hearing aid in both ears    has both.  Only wears right    Past Surgical History:  Procedure Laterality Date   APPENDECTOMY     CARDIOVERSION N/A 10/31/2022   Procedure: CARDIOVERSION;  Surgeon: Antonieta Iba, MD;  Location: [facility] ORS;  Service: Cardiovascular;  Laterality: N/A;   CATARACT EXTRACTION W/ INTRAOCULAR LENS IMPLANT Right 2016   In [place]   KNEE SURGERY      Prior to Admission medications   Medication Sig Start Date End Date Taking? Authorizing Provider  amiodarone (PACERONE) 200 MG tablet Take 400 MG (2 tablets) twice daily for one week. Then decrease to 200 MG ( 1 tablet) twice daily. 02/10/24  Yes Antonieta Iba, MD  atorvastatin (LIPITOR) 10 MG tablet Take 10 mg by mouth daily. 12/11/21  Yes [provider]  Calcium Carb-Cholecalciferol (CALCIUM 600/VITAMIN D PO) Take 1 tablet by mouth in the morning and at bedtime.   Yes [provider]  calcium citrate (CALCITRATE - DOSED IN MG ELEMENTAL CALCIUM) 950 (200 Ca) MG tablet Take 200 mg of elemental calcium by mouth daily. 11/27/23  Yes [provider]  cyanocobalamin 100 MCG tablet Take 100 mcg by mouth daily. 11/27/23  Yes [provider]  ELIQUIS 5 MG TABS tablet Take 5 mg by mouth 2 (two) times daily.   Yes [provider]  ferrous sulfate 324 MG TBEC Take 324 mg by mouth 3 (three) times a week. Take 1 tablet every morning, and 1 tablet in the evening every Wednesday and Saturday   Yes [provider]  fludrocortisone (FLORINEF) 0.1 MG tablet Take 2 tablets (200 mcg total) by mouth 2 (two) times daily. 04/29/23  Yes Antonieta Iba, MD  furosemide (LASIX) 20 MG tablet Take 1 tablet (20 mg total) by mouth every Monday, Wednesday, and Friday. May take 1 tablet ( 20 MG) daily as needed 02/11/24  Yes Gollan, Tollie Pizza, MD  gabapentin (NEURONTIN) 300 MG capsule Take 300-600 mg by mouth 3 (three) times daily. 12/12/21  Yes [provider]  midodrine (PROAMATINE) 10 MG tablet Take 10 mg by mouth 3 (three) times daily. 04/12/22  Yes [provider]  omeprazole (PRILOSEC) 20 MG capsule Take 1 capsule (20 mg total) by mouth 2 (two) times daily before a meal. 06/02/23 03/02/24 Yes  Tresa Moore, MD  Oxcarbazepine (TRILEPTAL) 300 MG tablet Take 300 mg by mouth 2 (two) times daily. 04/03/23  Yes [provider]  polyethylene glycol powder (MIRALAX) 17 GM/SCOOP powder Take 17 g by mouth as needed for mild constipation.   Yes [provider]  potassium chloride SA (KLOR-CON M) 20 MEQ tablet Take 20 mEq by mouth daily.   Yes [provider]  senna (SENOKOT) 8.6 MG TABS tablet Take 1 tablet by mouth daily as needed for mild constipation.   Yes [provider]    Allergies as of 02/18/2024 - Review Complete 02/10/2024  Allergen Reaction Noted   Pneumovax [pneumococcal polysaccharide vaccine]   08/03/2003   Pneumococcal vaccine  06/20/2013    Family History  Problem Relation Age of Onset   Kidney cancer Sister    Intracerebral hemorrhage Brother     Social History   Socioeconomic History   Marital status: Widowed    Spouse name: Not on file   Number of children: Not on file   Years of education: Not on file   Highest education level: Not on file  Occupational History   Not on file  Tobacco Use   Smoking status: Former    Current packs/day: 0.00    Average packs/day: 0.1 packs/day for 35.0 years (3.5 ttl pk-yrs)    Types: Cigarettes    Start date: 72    Quit date: 86    Years since quitting: 40.3   Smokeless tobacco: Never  Vaping Use   Vaping status: Never Used  Substance and Sexual Activity   Alcohol use: Not Currently   Drug use: Not Currently   Sexual activity: Not Currently  Other Topics Concern   Not on file  Social History Narrative   Not on file   Social Drivers of Health   Financial Resource Strain: Not on File (12/24/2019)   Received from [employer], [employer] [place]    Financial Resource Strain: 0  Food Insecurity: Patient Declined (06/03/2023)   Hunger Vital Sign    Worried About Running Out of Food in the Last Year: Patient declined    Ran Out of Food in the Last Year: Patient declined  Transportation Needs: Patient Declined (06/03/2023)   PRAPARE - Administrator, Civil Service (Medical): Patient declined    Lack of Transportation (Non-Medical): Patient declined  Physical Activity: Not on File (12/24/2019)   Received from [place], [place]   Physical Activity    Physical Activity: 0  Stress: Not on File (12/24/2019)   Received from [facility], [place]   Stress    Stress: 0  Social Connections: Not on File (12/24/2019)   Received from [place], [place]   Social Connections    Social Connections and Isolation: 0  Intimate Partner Violence: Not At Risk (07/11/2023)   Received from [employer]   Intimate Partner Violence     Are you denied basic needs such as food, clothing, or medical care?: No    In the past 12 months have you been in a relationship with a person who hurts, threatens, or tries to control you?: No    Are you denied basic needs such as food, clothing, or medical care?: No    In the past 12 months have you been in a relationship with a person who hurts, threatens, or tries to control you?: No    Review of Systems: See HPI, otherwise negative ROS  Physical Exam: BP (!) 162/101  Temp (!) 97.5 F (36.4 C) (Temporal)   Resp 19   Ht 5' 7.01" (1.702 m)   Wt 80 kg   SpO2 97%   BMI 27.61 kg/m  General:   Alert, cooperative. Head:  Normocephalic and atraumatic. Respiratory:  Normal work of breathing. Cardiovascular:  NAD  Impression/Plan: Tricia Edwards is here for cataract surgery.  Risks, benefits, limitations, and alternatives regarding cataract surgery have been reviewed with the patient.  Questions have been answered.  All parties agreeable.   Clair Crews, MD  03/02/2024, 11:07 AM

## 2024-03-16 ENCOUNTER — Other Ambulatory Visit: Payer: Self-pay | Admitting: Cardiovascular Disease

## 2024-03-17 ENCOUNTER — Ambulatory Visit: Admitting: Anesthesiology

## 2024-03-17 ENCOUNTER — Encounter
Admission: RE | Disposition: A | Discharge status: Home / Self Care | Payer: Self-pay | Source: Home / Self Care | Attending: Cardiovascular Disease

## 2024-03-17 ENCOUNTER — Ambulatory Visit
Admission: RE | Admit: 2024-03-17 | Discharge: 2024-03-17 | Disposition: A | Discharge status: Home / Self Care | Source: Home / Self Care | Attending: Cardiovascular Disease | Admitting: Cardiovascular Disease

## 2024-03-17 ENCOUNTER — Encounter: Payer: Self-pay | Admitting: Cardiovascular Disease

## 2024-03-17 DIAGNOSIS — I4891 Unspecified atrial fibrillation: Secondary | ICD-10-CM | POA: Diagnosis not present

## 2024-03-17 DIAGNOSIS — Z79899 Other long term (current) drug therapy: Secondary | ICD-10-CM | POA: Insufficient documentation

## 2024-03-17 DIAGNOSIS — I951 Orthostatic hypotension: Secondary | ICD-10-CM | POA: Insufficient documentation

## 2024-03-17 DIAGNOSIS — I252 Old myocardial infarction: Secondary | ICD-10-CM | POA: Insufficient documentation

## 2024-03-17 DIAGNOSIS — I4819 Other persistent atrial fibrillation: Secondary | ICD-10-CM

## 2024-03-17 DIAGNOSIS — Z87891 Personal history of nicotine dependence: Secondary | ICD-10-CM | POA: Insufficient documentation

## 2024-03-17 DIAGNOSIS — I5032 Chronic diastolic (congestive) heart failure: Secondary | ICD-10-CM | POA: Insufficient documentation

## 2024-03-17 DIAGNOSIS — Z7901 Long term (current) use of anticoagulants: Secondary | ICD-10-CM | POA: Insufficient documentation

## 2024-03-17 DIAGNOSIS — I509 Heart failure, unspecified: Secondary | ICD-10-CM | POA: Diagnosis not present

## 2024-03-17 DIAGNOSIS — E782 Mixed hyperlipidemia: Secondary | ICD-10-CM | POA: Insufficient documentation

## 2024-03-17 DIAGNOSIS — I4581 Long QT syndrome: Secondary | ICD-10-CM | POA: Diagnosis not present

## 2024-03-17 DIAGNOSIS — I130 Hypertensive heart and chronic kidney disease with heart failure and stage 1 through stage 4 chronic kidney disease, or unspecified chronic kidney disease: Secondary | ICD-10-CM | POA: Diagnosis not present

## 2024-03-17 DIAGNOSIS — D509 Iron deficiency anemia, unspecified: Secondary | ICD-10-CM | POA: Insufficient documentation

## 2024-03-17 DIAGNOSIS — I110 Hypertensive heart disease with heart failure: Secondary | ICD-10-CM | POA: Insufficient documentation

## 2024-03-17 HISTORY — PX: CARDIOVERSION: SHX1299

## 2024-03-17 SURGERY — CARDIOVERSION
Anesthesia: General

## 2024-03-17 MED ORDER — SODIUM CHLORIDE 0.9 % IV SOLN: INTRAVENOUS | Status: DC | PRN

## 2024-03-17 MED ORDER — PROPOFOL 10 MG/ML IV BOLUS
INTRAVENOUS | Status: DC | PRN
  Administered 2024-03-17: 50 mg via INTRAVENOUS

## 2024-03-17 MED ORDER — SODIUM CHLORIDE 0.9% FLUSH
3.0000 mL | Freq: Two times a day (BID) | INTRAVENOUS | Status: DC
Start: 2024-03-17 — End: 2024-03-17

## 2024-03-17 MED ORDER — SODIUM CHLORIDE 0.9% FLUSH
3.0000 mL | INTRAVENOUS | Status: DC | PRN
Start: 2024-03-17 — End: 2024-03-17

## 2024-03-17 NOTE — CV Procedure (Signed)
Cardioversion procedure note For atrial fibrillation, persistent  Procedure Details:  Consent: Risks of procedure as well as the alternatives and risks of each were explained to the (patient/caregiver).  Consent for procedure obtained.  Time Out: Verified patient identification, verified procedure, site/side was marked, verified correct patient position, special equipment/implants available, medications/allergies/relevent history reviewed, required imaging and test results available.  Performed  Patient placed on cardiac monitor, pulse oximetry, supplemental oxygen as necessary.   Sedation given: propofol IV, Dr. Karenz Pacer pads placed anterior and posterior chest.   Cardioverted 1 time(s).   Cardioverted at  150 J. Synchronized biphasic Converted to NSR   Evaluation: Findings: Post procedure EKG shows: NSR Complications: None Patient did tolerate procedure well.  Time Spent Directly with the Patient:  45 minutes   Tim Gollan, M.D., Ph.D.  

## 2024-03-17 NOTE — Transfer of Care (Signed)
 Immediate Anesthesia Transfer of Care Note  Patient: Tricia Edwards  Procedure(s) Performed: CARDIOVERSION  Patient Location: PACU  Anesthesia Type:General  Level of Consciousness: drowsy and patient cooperative  Airway & Oxygen Therapy: Patient Spontanous Breathing and Patient connected to nasal cannula oxygen  Post-op Assessment: Report given to RN and Post -op Vital signs reviewed and stable  Post vital signs: Reviewed and stable  Last Vitals:  Vitals Value Taken Time  BP 161/76 03/17/24 0744  Temp    Pulse 66 03/17/24 0742  Resp 10 03/17/24 0744  SpO2 90 % 03/17/24 0744    Last Pain:  Vitals:   03/17/24 0643  TempSrc: Oral  PainSc: 0-No pain         Complications: No notable events documented.

## 2024-03-17 NOTE — Anesthesia Preprocedure Evaluation (Signed)
 Anesthesia Evaluation  Patient identified by MRN, date of birth, ID band Patient awake    Reviewed: Allergy & Precautions, NPO status , Patient's Chart, lab work & pertinent test results  History of Anesthesia Complications Negative for: history of anesthetic complications  Airway Mallampati: II  TM Distance: >3 FB Neck ROM: full    Dental  (+) Edentulous Upper, Edentulous Lower, Dental Advidsory Given   Pulmonary neg shortness of breath, COPD, neg recent URI, former smoker   Pulmonary exam normal        Cardiovascular hypertension, (-) angina + CAD and +CHF (Grade 1 Diastolic Dysfunction)  (-) Past MI and (-) Cardiac Stents + dysrhythmias Atrial Fibrillation  Rhythm:Irregular Rate:Normal  Orthostatic hypotension on midodrine  and fludrocortisone    IMPRESSIONS     1. Left ventricular ejection fraction, by estimation, is 60 to 65%. The  left ventricle has normal function. The left ventricle has no regional  wall motion abnormalities. There is moderate left ventricular hypertrophy.  Left ventricular diastolic  parameters are consistent with Grade I diastolic dysfunction (impaired  relaxation).   2. Right ventricular systolic function is normal. The right ventricular  size is normal.   3. Left atrial size was mildly dilated.     Neuro/Psych neg Seizures Trigeminal neuralgia  TIA Neuromuscular disease  negative psych ROS   GI/Hepatic negative GI ROS, Neg liver ROS,,,  Endo/Other  negative endocrine ROS    Renal/GU Renal disease  negative genitourinary   Musculoskeletal   Abdominal   Peds  Hematology Eliquis     Anesthesia Other Findings Past Medical History: No date: Hypertension No date: Paroxysmal A-fib (HCC)  Past Surgical History: No date: APPENDECTOMY No date: KNEE SURGERY     Reproductive/Obstetrics negative OB ROS                             Anesthesia Physical Anesthesia  Plan  ASA: 3  Anesthesia Plan: General   Post-op Pain Management: Minimal or no pain anticipated   Induction: Intravenous  PONV Risk Score and Plan: Propofol  infusion and TIVA  Airway Management Planned: Natural Airway and Nasal Cannula  Additional Equipment:   Intra-op Plan:   Post-operative Plan:   Informed Consent: I have reviewed the patients History and Physical, chart, labs and discussed the procedure including the risks, benefits and alternatives for the proposed anesthesia with the patient or authorized representative who has indicated his/her understanding and acceptance.     Dental Advisory Given  Plan Discussed with: Anesthesiologist, CRNA and Surgeon  Anesthesia Plan Comments: (Patient consented for risks of anesthesia including but not limited to:  - adverse reactions to medications - risk of airway placement if required - damage to eyes, teeth, lips or other oral mucosa - nerve damage due to positioning  - sore throat or hoarseness - Damage to heart, brain, nerves, lungs, other parts of body or loss of life  Patient voiced understanding.)       Anesthesia Quick Evaluation

## 2024-03-18 ENCOUNTER — Emergency Department

## 2024-03-18 ENCOUNTER — Encounter: Payer: Self-pay | Admitting: Emergency Medicine

## 2024-03-18 ENCOUNTER — Inpatient Hospital Stay
Admission: EM | Admit: 2024-03-18 | Discharge: 2024-03-20 | DRG: 291 | Disposition: A | Discharge status: Home / Self Care | Attending: Internal Medicine | Admitting: Internal Medicine

## 2024-03-18 ENCOUNTER — Other Ambulatory Visit: Payer: Self-pay

## 2024-03-18 DIAGNOSIS — N1831 Chronic kidney disease, stage 3a: Secondary | ICD-10-CM | POA: Diagnosis present

## 2024-03-18 DIAGNOSIS — K219 Gastro-esophageal reflux disease without esophagitis: Secondary | ICD-10-CM | POA: Diagnosis present

## 2024-03-18 DIAGNOSIS — I509 Heart failure, unspecified: Principal | ICD-10-CM

## 2024-03-18 DIAGNOSIS — I951 Orthostatic hypotension: Secondary | ICD-10-CM | POA: Diagnosis present

## 2024-03-18 DIAGNOSIS — I130 Hypertensive heart and chronic kidney disease with heart failure and stage 1 through stage 4 chronic kidney disease, or unspecified chronic kidney disease: Principal | ICD-10-CM | POA: Diagnosis present

## 2024-03-18 DIAGNOSIS — E782 Mixed hyperlipidemia: Secondary | ICD-10-CM | POA: Diagnosis present

## 2024-03-18 DIAGNOSIS — Z7952 Long term (current) use of systemic steroids: Secondary | ICD-10-CM

## 2024-03-18 DIAGNOSIS — Z87891 Personal history of nicotine dependence: Secondary | ICD-10-CM

## 2024-03-18 DIAGNOSIS — J9601 Acute respiratory failure with hypoxia: Secondary | ICD-10-CM | POA: Diagnosis present

## 2024-03-18 DIAGNOSIS — Z79899 Other long term (current) drug therapy: Secondary | ICD-10-CM

## 2024-03-18 DIAGNOSIS — Z9842 Cataract extraction status, left eye: Secondary | ICD-10-CM

## 2024-03-18 DIAGNOSIS — K760 Fatty (change of) liver, not elsewhere classified: Secondary | ICD-10-CM | POA: Diagnosis present

## 2024-03-18 DIAGNOSIS — I340 Nonrheumatic mitral (valve) insufficiency: Secondary | ICD-10-CM | POA: Diagnosis present

## 2024-03-18 DIAGNOSIS — Z8673 Personal history of transient ischemic attack (TIA), and cerebral infarction without residual deficits: Secondary | ICD-10-CM

## 2024-03-18 DIAGNOSIS — Z7901 Long term (current) use of anticoagulants: Secondary | ICD-10-CM

## 2024-03-18 DIAGNOSIS — I5033 Acute on chronic diastolic (congestive) heart failure: Secondary | ICD-10-CM | POA: Diagnosis present

## 2024-03-18 DIAGNOSIS — Z961 Presence of intraocular lens: Secondary | ICD-10-CM | POA: Diagnosis present

## 2024-03-18 DIAGNOSIS — M6281 Muscle weakness (generalized): Secondary | ICD-10-CM | POA: Diagnosis present

## 2024-03-18 DIAGNOSIS — G500 Trigeminal neuralgia: Secondary | ICD-10-CM | POA: Diagnosis present

## 2024-03-18 DIAGNOSIS — Z887 Allergy status to serum and vaccine status: Secondary | ICD-10-CM

## 2024-03-18 DIAGNOSIS — R5381 Other malaise: Secondary | ICD-10-CM | POA: Diagnosis present

## 2024-03-18 DIAGNOSIS — Z974 Presence of external hearing-aid: Secondary | ICD-10-CM

## 2024-03-18 DIAGNOSIS — I2510 Atherosclerotic heart disease of native coronary artery without angina pectoris: Secondary | ICD-10-CM | POA: Diagnosis present

## 2024-03-18 DIAGNOSIS — Z9841 Cataract extraction status, right eye: Secondary | ICD-10-CM

## 2024-03-18 DIAGNOSIS — I4819 Other persistent atrial fibrillation: Secondary | ICD-10-CM | POA: Diagnosis present

## 2024-03-18 DIAGNOSIS — J449 Chronic obstructive pulmonary disease, unspecified: Secondary | ICD-10-CM | POA: Diagnosis present

## 2024-03-18 LAB — CBC WITH DIFFERENTIAL/PLATELET
Abs Immature Granulocytes: 0.04 10*3/uL (ref 0.00–0.07)
Basophils Absolute: 0 10*3/uL (ref 0.0–0.1)
Basophils Relative: 1 %
Eosinophils Absolute: 0.3 10*3/uL (ref 0.0–0.5)
Eosinophils Relative: 3 %
HCT: 33.2 % — ABNORMAL LOW (ref 36.0–46.0)
Hemoglobin: 10.6 g/dL — ABNORMAL LOW (ref 12.0–15.0)
Immature Granulocytes: 1 %
Lymphocytes Relative: 17 %
Lymphs Abs: 1.3 10*3/uL (ref 0.7–4.0)
MCH: 31.3 pg (ref 26.0–34.0)
MCHC: 31.9 g/dL (ref 30.0–36.0)
MCV: 97.9 fL (ref 80.0–100.0)
Monocytes Absolute: 0.8 10*3/uL (ref 0.1–1.0)
Monocytes Relative: 11 %
Neutro Abs: 5.3 10*3/uL (ref 1.7–7.7)
Neutrophils Relative %: 67 %
Platelets: 199 10*3/uL (ref 150–400)
RBC: 3.39 MIL/uL — ABNORMAL LOW (ref 3.87–5.11)
RDW: 14 % (ref 11.5–15.5)
WBC: 7.8 10*3/uL (ref 4.0–10.5)
nRBC: 0 % (ref 0.0–0.2)

## 2024-03-18 LAB — TROPONIN I (HIGH SENSITIVITY)
Troponin I (High Sensitivity): 13 ng/L (ref <18)
Troponin I (High Sensitivity): 15 ng/L (ref <18)

## 2024-03-18 LAB — COMPREHENSIVE METABOLIC PANEL WITH GFR
ALT: 26 U/L (ref 0–44)
AST: 27 U/L (ref 15–41)
Albumin: 3.5 g/dL (ref 3.5–5.0)
Alkaline Phosphatase: 111 U/L (ref 38–126)
Anion gap: 10 (ref 5–15)
BUN: 26 mg/dL — ABNORMAL HIGH (ref 8–23)
CO2: 26 mmol/L (ref 22–32)
Calcium: 8.9 mg/dL (ref 8.9–10.3)
Chloride: 105 mmol/L (ref 98–111)
Creatinine, Ser: 1.23 mg/dL — ABNORMAL HIGH (ref 0.44–1.00)
GFR, Estimated: 41 mL/min — ABNORMAL LOW (ref >60)
Glucose, Bld: 105 mg/dL — ABNORMAL HIGH (ref 70–99)
Potassium: 4 mmol/L (ref 3.5–5.1)
Sodium: 141 mmol/L (ref 135–145)
Total Bilirubin: 0.7 mg/dL (ref 0.0–1.2)
Total Protein: 6.5 g/dL (ref 6.5–8.1)

## 2024-03-18 LAB — BRAIN NATRIURETIC PEPTIDE: B Natriuretic Peptide: 396.1 pg/mL — ABNORMAL HIGH (ref 0.0–100.0)

## 2024-03-18 MED ORDER — SODIUM CHLORIDE 0.9% FLUSH
3.0000 mL | Freq: Two times a day (BID) | INTRAVENOUS | Status: DC
  Administered 2024-03-18 – 2024-03-20 (×5): 3 mL via INTRAVENOUS

## 2024-03-18 MED ORDER — MIDODRINE HCL 5 MG PO TABS
10.0000 mg | ORAL_TABLET | Freq: Three times a day (TID) | ORAL | Status: DC
  Administered 2024-03-18 – 2024-03-19 (×3): 10 mg via ORAL
  Administered 2024-03-19: 5 mg via ORAL
  Administered 2024-03-20: 10 mg via ORAL
  Filled 2024-03-18 (×5): qty 2

## 2024-03-18 MED ORDER — FUROSEMIDE 10 MG/ML IJ SOLN: 40.0000 mg | Freq: Two times a day (BID) | INTRAMUSCULAR | Status: DC

## 2024-03-18 MED ORDER — GABAPENTIN 300 MG PO CAPS: 300.0000 mg | ORAL_CAPSULE | Freq: Three times a day (TID) | ORAL | Status: DC

## 2024-03-18 MED ORDER — GABAPENTIN 300 MG PO CAPS
600.0000 mg | ORAL_CAPSULE | Freq: Every day | ORAL | Status: DC
  Administered 2024-03-18 – 2024-03-19 (×2): 600 mg via ORAL
  Filled 2024-03-18 (×2): qty 2

## 2024-03-18 MED ORDER — FLUDROCORTISONE ACETATE 0.1 MG PO TABS
200.0000 ug | ORAL_TABLET | Freq: Two times a day (BID) | ORAL | Status: DC
  Administered 2024-03-18 – 2024-03-19 (×3): 200 ug via ORAL
  Filled 2024-03-18 (×5): qty 2

## 2024-03-18 MED ORDER — MIDODRINE HCL 5 MG PO TABS: 10.0000 mg | ORAL_TABLET | Freq: Three times a day (TID) | ORAL | Status: DC

## 2024-03-18 MED ORDER — FUROSEMIDE 10 MG/ML IJ SOLN
20.0000 mg | Freq: Two times a day (BID) | INTRAMUSCULAR | Status: DC
  Administered 2024-03-18: 20 mg via INTRAVENOUS
  Filled 2024-03-18: qty 4

## 2024-03-18 MED ORDER — ONDANSETRON HCL 4 MG/2ML IJ SOLN: 4.0000 mg | Freq: Four times a day (QID) | INTRAMUSCULAR | Status: DC | PRN

## 2024-03-18 MED ORDER — ACETAMINOPHEN 325 MG PO TABS
650.0000 mg | ORAL_TABLET | ORAL | Status: DC | PRN
  Administered 2024-03-18: 650 mg via ORAL

## 2024-03-18 MED ORDER — IPRATROPIUM-ALBUTEROL 0.5-2.5 (3) MG/3ML IN SOLN
3.0000 mL | Freq: Once | RESPIRATORY_TRACT | Status: AC
  Administered 2024-03-18: 3 mL via RESPIRATORY_TRACT
  Filled 2024-03-18: qty 3

## 2024-03-18 MED ORDER — SENNA 8.6 MG PO TABS
1.0000 | ORAL_TABLET | Freq: Every morning | ORAL | Status: DC
  Administered 2024-03-18 – 2024-03-20 (×3): 8.6 mg via ORAL
  Filled 2024-03-18 (×3): qty 1

## 2024-03-18 MED ORDER — PANTOPRAZOLE SODIUM 40 MG PO TBEC
40.0000 mg | DELAYED_RELEASE_TABLET | Freq: Every day | ORAL | Status: DC
  Administered 2024-03-18 – 2024-03-20 (×3): 40 mg via ORAL
  Filled 2024-03-18 (×3): qty 1

## 2024-03-18 MED ORDER — OXCARBAZEPINE 300 MG PO TABS
300.0000 mg | ORAL_TABLET | Freq: Two times a day (BID) | ORAL | Status: DC
  Administered 2024-03-18 – 2024-03-20 (×5): 300 mg via ORAL
  Filled 2024-03-18 (×5): qty 1

## 2024-03-18 MED ORDER — FUROSEMIDE 10 MG/ML IJ SOLN
40.0000 mg | Freq: Once | INTRAMUSCULAR | Status: AC
  Administered 2024-03-18: 40 mg via INTRAVENOUS
  Filled 2024-03-18: qty 4

## 2024-03-18 MED ORDER — APIXABAN 5 MG PO TABS
5.0000 mg | ORAL_TABLET | Freq: Two times a day (BID) | ORAL | Status: DC
  Administered 2024-03-18 – 2024-03-20 (×5): 5 mg via ORAL
  Filled 2024-03-18 (×5): qty 1

## 2024-03-18 MED ORDER — SODIUM CHLORIDE 0.9 % IV SOLN: 250.0000 mL | INTRAVENOUS | Status: AC | PRN

## 2024-03-18 MED ORDER — SODIUM CHLORIDE 0.9% FLUSH: 3.0000 mL | INTRAVENOUS | Status: DC | PRN

## 2024-03-18 MED ORDER — ATORVASTATIN CALCIUM 10 MG PO TABS
10.0000 mg | ORAL_TABLET | Freq: Every day | ORAL | Status: DC
Start: 2024-03-18 — End: 2024-03-20
  Administered 2024-03-18 – 2024-03-20 (×3): 10 mg via ORAL
  Filled 2024-03-18 (×3): qty 1

## 2024-03-18 MED ORDER — GABAPENTIN 300 MG PO CAPS
300.0000 mg | ORAL_CAPSULE | Freq: Every day | ORAL | Status: DC
  Administered 2024-03-18 – 2024-03-20 (×3): 300 mg via ORAL
  Filled 2024-03-18 (×3): qty 1

## 2024-03-18 MED ORDER — POLYETHYLENE GLYCOL 3350 17 G PO PACK: 17.0000 g | PACK | Freq: Every day | ORAL | Status: DC | PRN

## 2024-03-18 MED ORDER — AMIODARONE HCL 200 MG PO TABS
200.0000 mg | ORAL_TABLET | Freq: Two times a day (BID) | ORAL | Status: DC
  Administered 2024-03-18 – 2024-03-20 (×4): 200 mg via ORAL
  Filled 2024-03-18 (×5): qty 1

## 2024-03-18 NOTE — ED Triage Notes (Addendum)
 Pt to triage via w/c, appears anxious; family reports [facility] & wheezing (sats 80's on ra); cardioversion yesterday; denies any other accomp symptoms

## 2024-03-18 NOTE — ED Provider Notes (Signed)
 [facility] Provider Note    Event Date/Time   First MD Initiated Contact with Patient 03/18/24 [phone]     (approximate)  History   Chief Complaint: Shortness of Breath  HPI  Tricia Edwards is a 89 y.o. female with a past medical history of CHF, paroxysmal atrial fibrillation, CKD, hypertension, COPD, presents to the emergency department for worsening shortness of breath/difficulty breathing.  According to the patient and family patient has paroxysmal atrial fibrillation underwent a cardioversion by Dr. Gollan yesterday.  Daughter states the patient has been feeling short of breath for the last 2 or 3 days but worse since the cardioversion was performed yesterday.  Daughter has been checking her pulse oximetry at home and today it was in the 70s she was able to get into the 80s but not into the 90s so they came to the emergency department.  Here patient is satting 87% on room air started on 4 L nasal cannula oxygen.  No baseline O2 requirement.  Daughter states the patient has had a wheeze but she has been scared to use her albuterol  nebulizer at home as she did not want to induce tachycardia.  Physical Exam   Triage Vital Signs: ED Triage Vitals [03/18/24 0653]  Encounter Vitals Group     BP      Systolic BP Percentile      Diastolic BP Percentile      Pulse      Resp      Temp      Temp src      SpO2      Weight 176 lb 5.9 oz (80 kg)     Height 5\' 7"  (1.702 m)     Head Circumference      Peak Flow      Pain Score 0     Pain Loc      Pain Education      Exclude from Growth Chart     Most recent vital signs: There were no vitals filed for this visit.  General: Awake, no distress.  CV:  Good peripheral perfusion.  Regular rate and rhythm  Resp:  Mild tachypnea.  Very slight expiratory wheeze.  No rales or rhonchi. Abd:  No distention.  Soft, nontender.  No rebound or guarding. Other:  No significant lower extremity edema but currently wearing  compression stockings.   ED Results / Procedures / Treatments   EKG  EKG viewed and interpreted by myself shows a sinus rhythm at 76 bpm with a narrow QRS, left axis deviation, slight QTc prolongation otherwise reassuring intervals.  No concerning ST changes.  RADIOLOGY  I have reviewed interpret the chest x-ray images.  Patient appears to have some chronic scarring when compared to the last chest x-ray however possible superimposed pulmonary edema.  Awaiting radiology read.   MEDICATIONS ORDERED IN ED: Medications  ipratropium-albuterol  (DUONEB) 0.5-2.5 (3) MG/3ML nebulizer solution 3 mL (has no administration in time range)     IMPRESSION / MDM / ASSESSMENT AND PLAN / ED COURSE  I reviewed the triage vital signs and the nursing notes.  Patient's presentation is most consistent with acute presentation with potential threat to life or bodily function.  Patient presents to the emergency department for worsening shortness of breath over the last 2 to 3 days worse since cardioversion was completed yesterday.  Does have slight expiratory wheeze.  Patient quit smoking in 1985 but has been told at times that she might have mild COPD.  Will dose a DuoNeb in the emergency department.  Patient is in a normal sinus rhythm currently.  We will check labs including a troponin and a BNP.  Patient satting in the 80s on room air satting in the upper 90s to 100% on 4 L.  Will titrate as we are able.  We will continue to closely monitor.  Given the patient's hypoxia on room air I anticipate likely admission once her emergency department workup has been completed.  Patient's workup shows a reassuring CBC with a normal white blood cell count, reassuring chemistry.  Troponin reassuringly negative.  Patient's BNP is elevated to 396.  Patient's chest x-ray appears to show pulmonary edema.  Clinical picture and workup consistent with CHF exacerbation.  I have ordered 40 mg of IV Lasix .  Will admit to the hospital  service for further workup and treatment.  I spoke to Dr. Jerelene Monday of cardiology who will follow along with the patient as well.  FINAL CLINICAL IMPRESSION(S) / ED DIAGNOSES   Dyspnea CHF exacerbation  Note:  This document was prepared using Dragon voice recognition software and may include unintentional dictation errors.   Ruth Cove, MD 03/18/24 [phone]

## 2024-03-18 NOTE — ED Notes (Signed)
 Patient placed on 4L of oxygen upon arrival to room. Sats in room were 79%. After a few minutes of oxygen, sats up to 98%. Daughter at bedside. IV initiated.

## 2024-03-18 NOTE — H&P (Signed)
 History and Physical    Tricia Edwards XBM:[id number] DOB: 05-27-1930 DOA: 03/18/2024  PCP: [facility] (Confirm with patient/family/NH records and if not entered, this has to be entered at [facility] point of entry) Patient coming from: Home  I have personally briefly reviewed patient's old medical records in [facility]  Chief Complaint: SOB  HPI: Tricia Edwards is a 89 y.o. female with medical history significant of PAF s/p cardioversion yesterday, chronic HFpEF, COPD, HTN, CKD stage IIIa, chronic iron deficiency anemia, orthostatic hypotension, trigeminal neuralgia, presented with worsening of shortness of breath.  Symptoms started about 3 to 4 weeks ago, patient started to have increasing exertional dyspnea, and she went to see PCP, when she was diagnosed with CHF decompensation and she was started on Lasix .  She felt somewhat improved however continued to experience exertional dyspnea and went to see cardiology, when it was found patient was in uncontrolled A-fib.  Yesterday patient underwent cardioversion, which was successful.  Overnight however patient experienced worsening of shortness of breath and family brought her to ED.  Denied any cough no chest pain no fever or chills.  Denied any peripheral edema. ED Course: Afebrile, no tachycardia blood pressure 150/100 O2 saturation 98% on 4 L.  Chest x-ray showed pulmonary congestion, blood work showed BUN 26 creatinine 1.2 which is above her baseline WBC 7.8 hemoglobin 10.6 which is above her baseline.  Troponin negative x 1, BNP=396  Patient was given IV Lasix  40 mg x 1 in the ED.  Review of Systems: As per HPI otherwise 14 point review of systems negative.    Past Medical History:  Diagnosis Date   Benign paroxysmal positional vertigo    CHF (congestive heart failure) (HCC)    Chronic diastolic (congestive) heart failure (HCC)    Chronic orthostatic hypotension    COPD (chronic obstructive pulmonary disease) (HCC)     Dry eyes    GERD (gastroesophageal reflux disease)    Hepatic steatosis    History of GI bleed    Hx of syncope    Hypertension    Mild aortic stenosis by prior echocardiogram    Mild concentric left ventricular hypertrophy (LVH)    Moderate mitral regurgitation by prior echocardiogram    Orthostatic hypotension    Paroxysmal A-fib (HCC)    Stable angina pectoris (HCC)    Stage 3a chronic kidney disease (CKD) (HCC)    TIA (transient ischemic attack)    no deficits   Trigeminal neuralgia    Wears dentures    full upper and lower   Wears hearing aid in both ears    has both.  Only wears right    Past Surgical History:  Procedure Laterality Date   APPENDECTOMY     CARDIOVERSION N/A 10/31/2022   Procedure: CARDIOVERSION;  Surgeon: Devorah Fonder, MD;  Location: [facility] ORS;  Service: Cardiovascular;  Laterality: N/A;   CATARACT EXTRACTION W/ INTRAOCULAR LENS IMPLANT Right 2016   In [place]    CATARACT EXTRACTION W/PHACO Left 03/02/2024   Procedure: PHACOEMULSIFICATION, CATARACT, WITH IOL INSERTION 12.23 00:58.9;  Surgeon: Clair Crews, MD;  Location: [facility];  Service: Ophthalmology;  Laterality: Left;   KNEE SURGERY       reports that she quit smoking about 40 years ago. Her smoking use included cigarettes. She started smoking about 75 years ago. She has a 3.5 pack-year smoking history. She has never used smokeless tobacco. She reports that she does not currently use alcohol. She reports that she does  not currently use drugs.  Allergies  Allergen Reactions   Pneumovax [Pneumococcal Polysaccharide Vaccine]     Other reaction(s): Severe arm swelling    Family History  Problem Relation Age of Onset   Kidney cancer Sister    Intracerebral hemorrhage Brother      Prior to Admission medications   Medication Sig Start Date End Date Taking? Authorizing Provider  amiodarone  (PACERONE ) 200 MG tablet Take 400 MG (2 tablets) twice daily for one week. Then  decrease to 200 MG ( 1 tablet) twice daily. Patient taking differently: Take 200 mg by mouth 2 (two) times daily. 02/10/24   Gollan, Timothy J, MD  atorvastatin  (LIPITOR) 10 MG tablet Take 10 mg by mouth daily. 12/11/21   [provider]  Calcium  Carb-Cholecalciferol  (CALCIUM  600/VITAMIN D  PO) Take 1 tablet by mouth daily. 600 mg / 20 mcg    [provider]  cyanocobalamin 1000 MCG tablet Take 1,000 mcg by mouth daily. 11/27/23   [provider]  ELIQUIS  5 MG TABS tablet Take 5 mg by mouth 2 (two) times daily.    [provider]  ferrous sulfate  324 MG TBEC Take 324 mg by mouth 2 (two) times daily.    [provider]  fludrocortisone  (FLORINEF ) 0.1 MG tablet Take 2 tablets (200 mcg total) by mouth 2 (two) times daily. 04/29/23   Gollan, Timothy J, MD  furosemide  (LASIX ) 20 MG tablet Take 1 tablet (20 mg total) by mouth every Monday, Wednesday, and Friday. May take 1 tablet ( 20 MG) daily as needed 02/11/24   Gollan, Timothy J, MD  gabapentin  (NEURONTIN ) 300 MG capsule Take 300-600 mg by mouth 3 (three) times daily. Take 300 in the morning and afternoon and 600 mg at bedtime 12/12/21   [provider]  midodrine  (PROAMATINE ) 10 MG tablet Take 10 mg by mouth 3 (three) times daily. If Bp is 150-160 take 5 mg of over take nothing 04/12/22   [provider]  Multiple Vitamins-Minerals (PRESERVISION AREDS) TABS Take 1 tablet by mouth 2 (two) times daily.    [provider]  omeprazole  (PRILOSEC) 20 MG capsule Take 1 capsule (20 mg total) by mouth 2 (two) times daily before a meal. Patient taking differently: Take 20 mg by mouth in the morning. 06/02/23 03/12/24  Tiajuana Fluke, MD  Oxcarbazepine  (TRILEPTAL ) 300 MG tablet Take 300 mg by mouth 2 (two) times daily. 04/03/23   [provider]  polyethylene glycol powder (MIRALAX ) 17 GM/SCOOP powder Take 17 g by mouth daily as needed for mild constipation.    [provider]   potassium chloride  SA (KLOR-CON  M) 20 MEQ tablet Take 20 mEq by mouth in the morning.    [provider]  prednisoLONE-Moxifloxacin  1-0.5 % SOLN Place 1 drop into both eyes 2 (two) times daily. With Bromfenac 0.075%    [provider]  senna (SENOKOT) 8.6 MG TABS tablet Take 1 tablet by mouth in the morning.    [provider]    Physical Exam: Vitals:   03/18/24 0653 03/18/24 0715 03/18/24 0800 03/18/24 0833  BP:    (!) 150/116  Pulse:  73 73 72  Resp:  (!) 22 (!) 23 (!) 24  Temp:    98.1 F (36.7 C)  TempSrc:    Oral  SpO2:  100% 100% 99%  Weight: 80 kg     Height: 5\' 7"  (1.702 m)       Constitutional: NAD, calm, comfortable Vitals:   03/18/24  5784 03/18/24 0715 03/18/24 0800 03/18/24 0833  BP:    (!) 150/116  Pulse:  73 73 72  Resp:  (!) 22 (!) 23 (!) 24  Temp:    98.1 F (36.7 C)  TempSrc:    Oral  SpO2:  100% 100% 99%  Weight: 80 kg     Height: 5\' 7"  (1.702 m)      Eyes: PERRL, lids and conjunctivae normal ENMT: Mucous membranes are moist. Posterior pharynx clear of any exudate or lesions.Normal dentition.  Neck: normal, supple, no masses, no thyromegaly Respiratory: clear to auscultation bilaterally, no wheezing, fine crackles on bilateral lung fields increasing respiratory effort. No accessory muscle use.  Cardiovascular: Regular rate and rhythm, no murmurs / rubs / gallops. No extremity edema. 2+ pedal pulses. No carotid bruits.  Abdomen: no tenderness, no masses palpated. No hepatosplenomegaly. Bowel sounds positive.  Musculoskeletal: no clubbing / cyanosis. No joint deformity upper and lower extremities. Good ROM, no contractures. Normal muscle tone.  Skin: no rashes, lesions, ulcers. No induration Neurologic: CN 2-12 grossly intact. Sensation intact, DTR normal. Strength 5/5 in all 4.  Psychiatric: Normal judgment and insight. Alert and oriented x 3. Normal mood.     Labs on Admission: I have personally reviewed following labs and  imaging studies  CBC: Recent Labs  Lab 03/18/24 0659  WBC 7.8  NEUTROABS 5.3  HGB 10.6*  HCT 33.2*  MCV 97.9  PLT 199   Basic Metabolic Panel: Recent Labs  Lab 03/18/24 0659  NA 141  K 4.0  CL 105  CO2 26  GLUCOSE 105*  BUN 26*  CREATININE 1.23*  CALCIUM  8.9   GFR: Estimated Creatinine Clearance: 32.5 mL/min (A) (by C-G formula based on SCr of 1.23 mg/dL (H)). Liver Function Tests: Recent Labs  Lab 03/18/24 0659  AST 27  ALT 26  ALKPHOS 111  BILITOT 0.7  PROT 6.5  ALBUMIN 3.5   No results for input(s): "LIPASE", "AMYLASE" in the last 168 hours. No results for input(s): "AMMONIA" in the last 168 hours. Coagulation Profile: No results for input(s): "INR", "PROTIME" in the last 168 hours. Cardiac Enzymes: No results for input(s): "CKTOTAL", "CKMB", "CKMBINDEX", "TROPONINI" in the last 168 hours. BNP (last 3 results) No results for input(s): "PROBNP" in the last 8760 hours. HbA1C: No results for input(s): "HGBA1C" in the last 72 hours. CBG: No results for input(s): "GLUCAP" in the last 168 hours. Lipid Profile: No results for input(s): "CHOL", "HDL", "LDLCALC", "TRIG", "CHOLHDL", "LDLDIRECT" in the last 72 hours. Thyroid  Function Tests: No results for input(s): "TSH", "T4TOTAL", "FREET4", "T3FREE", "THYROIDAB" in the last 72 hours. Anemia Panel: No results for input(s): "VITAMINB12", "FOLATE", "FERRITIN", "TIBC", "IRON", "RETICCTPCT" in the last 72 hours. Urine analysis:    Component Value Date/Time   COLORURINE YELLOW (A) 08/07/2023 0911   APPEARANCEUR CLEAR (A) 08/07/2023 0911   LABSPEC 1.010 08/07/2023 0911   PHURINE 7.0 08/07/2023 0911   GLUCOSEU NEGATIVE 08/07/2023 0911   HGBUR NEGATIVE 08/07/2023 0911   BILIRUBINUR NEGATIVE 08/07/2023 0911   KETONESUR NEGATIVE 08/07/2023 0911   PROTEINUR NEGATIVE 08/07/2023 0911   NITRITE NEGATIVE 08/07/2023 0911   LEUKOCYTESUR NEGATIVE 08/07/2023 0911    Radiological Exams on Admission: DG Chest Portable 1  View Result Date: 03/18/2024 CLINICAL DATA:  Shortness of breath. EXAM: PORTABLE CHEST 1 VIEW COMPARISON:  AP Lat chest 09/06/2023 FINDINGS: There is mild cardiomegaly, slightly increased. Interval new perihilar vascular congestion with mild interstitial edema showing a basal gradient. Small pleural effusions are beginning  to form. There is patchy haziness in the lower lung fields which could be ground-glass edema or pneumonitis. No other focal lung infiltrate is seen. The mediastinum is stable with aortic patchy calcific plaques with uncoiling. There are bilateral high riding humeral heads suggesting chronic rotator cuff arthropathy. There is thoracic spondylosis. IMPRESSION: 1. Onset of CHF with basal predominant interstitial edema. 2. Small pleural effusions are beginning to form. 3. Patchy haziness in the lower lung fields which could be ground-glass edema or pneumonitis. 4. Aortic atherosclerosis. Electronically Signed   By: Denman Fischer M.D.   On: 03/18/2024 07:25    EKG: Independently reviewed.  Sinus rhythm, no acute ST changes, QTc 509.  Assessment/Plan Principal Problem:   CHF (congestive heart failure) (HCC) Active Problems:   Acute on chronic diastolic CHF (congestive heart failure) (HCC)   Acute respiratory failure with hypoxia (HCC)  (please populate well all problems here in Problem List. (For example, if patient is on BP meds at home and you resume or decide to hold them, it is a problem that needs to be her. Same for CAD, COPD, HLD and so on)  Acute on chronic HFpEF decompensated - Continue IV diuresis, decrease dosage to Lasix  IV 20 mg twice daily given her age and kidney function, repeat chest x-ray tomorrow - Outpatient echocardiogram  PAF - In sinus rhythm after yesterday's cardioversion - Continue amiodarone  and Eliquis   Orthostatic hypotension - No acute concern, continue midodrine  and fludrocortisone   Trigeminal neuralgia - Stable, continue gabapentin  and  Trileptal   DVT prophylaxis: Eliquis  Code Status: Full code Family Communication: Daughter at bedside Disposition Plan: Expect less than 2 midnight hospital stay Consults called: Dr. Gollan saw the patient in ED, not sure he will continue to follow Admission status: Tele obs   [place] MD [facility] Pager [phone]  03/18/2024, 11:40 AM

## 2024-03-19 ENCOUNTER — Encounter: Payer: Self-pay | Admitting: Internal Medicine

## 2024-03-19 ENCOUNTER — Observation Stay

## 2024-03-19 DIAGNOSIS — J449 Chronic obstructive pulmonary disease, unspecified: Secondary | ICD-10-CM | POA: Diagnosis present

## 2024-03-19 DIAGNOSIS — E782 Mixed hyperlipidemia: Secondary | ICD-10-CM | POA: Diagnosis present

## 2024-03-19 DIAGNOSIS — Z9842 Cataract extraction status, left eye: Secondary | ICD-10-CM | POA: Diagnosis not present

## 2024-03-19 DIAGNOSIS — I5033 Acute on chronic diastolic (congestive) heart failure: Secondary | ICD-10-CM | POA: Diagnosis present

## 2024-03-19 DIAGNOSIS — I480 Paroxysmal atrial fibrillation: Secondary | ICD-10-CM

## 2024-03-19 DIAGNOSIS — Z8673 Personal history of transient ischemic attack (TIA), and cerebral infarction without residual deficits: Secondary | ICD-10-CM | POA: Diagnosis not present

## 2024-03-19 DIAGNOSIS — I509 Heart failure, unspecified: Secondary | ICD-10-CM | POA: Diagnosis present

## 2024-03-19 DIAGNOSIS — I340 Nonrheumatic mitral (valve) insufficiency: Secondary | ICD-10-CM | POA: Diagnosis present

## 2024-03-19 DIAGNOSIS — Z87891 Personal history of nicotine dependence: Secondary | ICD-10-CM | POA: Diagnosis not present

## 2024-03-19 DIAGNOSIS — G500 Trigeminal neuralgia: Secondary | ICD-10-CM | POA: Diagnosis present

## 2024-03-19 DIAGNOSIS — I951 Orthostatic hypotension: Secondary | ICD-10-CM | POA: Diagnosis present

## 2024-03-19 DIAGNOSIS — Z7901 Long term (current) use of anticoagulants: Secondary | ICD-10-CM | POA: Diagnosis not present

## 2024-03-19 DIAGNOSIS — Z974 Presence of external hearing-aid: Secondary | ICD-10-CM | POA: Diagnosis not present

## 2024-03-19 DIAGNOSIS — I4819 Other persistent atrial fibrillation: Secondary | ICD-10-CM | POA: Diagnosis present

## 2024-03-19 DIAGNOSIS — N1831 Chronic kidney disease, stage 3a: Secondary | ICD-10-CM | POA: Diagnosis present

## 2024-03-19 DIAGNOSIS — I130 Hypertensive heart and chronic kidney disease with heart failure and stage 1 through stage 4 chronic kidney disease, or unspecified chronic kidney disease: Secondary | ICD-10-CM | POA: Diagnosis present

## 2024-03-19 DIAGNOSIS — R5381 Other malaise: Secondary | ICD-10-CM | POA: Diagnosis present

## 2024-03-19 DIAGNOSIS — J9601 Acute respiratory failure with hypoxia: Secondary | ICD-10-CM | POA: Diagnosis present

## 2024-03-19 DIAGNOSIS — M6281 Muscle weakness (generalized): Secondary | ICD-10-CM | POA: Diagnosis present

## 2024-03-19 DIAGNOSIS — Z79899 Other long term (current) drug therapy: Secondary | ICD-10-CM | POA: Diagnosis not present

## 2024-03-19 DIAGNOSIS — K760 Fatty (change of) liver, not elsewhere classified: Secondary | ICD-10-CM | POA: Diagnosis present

## 2024-03-19 DIAGNOSIS — Z9841 Cataract extraction status, right eye: Secondary | ICD-10-CM | POA: Diagnosis not present

## 2024-03-19 DIAGNOSIS — Z961 Presence of intraocular lens: Secondary | ICD-10-CM | POA: Diagnosis present

## 2024-03-19 DIAGNOSIS — K219 Gastro-esophageal reflux disease without esophagitis: Secondary | ICD-10-CM | POA: Diagnosis present

## 2024-03-19 DIAGNOSIS — I2510 Atherosclerotic heart disease of native coronary artery without angina pectoris: Secondary | ICD-10-CM | POA: Diagnosis present

## 2024-03-19 DIAGNOSIS — Z7952 Long term (current) use of systemic steroids: Secondary | ICD-10-CM | POA: Diagnosis not present

## 2024-03-19 LAB — BASIC METABOLIC PANEL WITH GFR
Anion gap: 8 (ref 5–15)
BUN: 21 mg/dL (ref 8–23)
CO2: 32 mmol/L (ref 22–32)
Calcium: 8.2 mg/dL — ABNORMAL LOW (ref 8.9–10.3)
Chloride: 98 mmol/L (ref 98–111)
Creatinine, Ser: 1.02 mg/dL — ABNORMAL HIGH (ref 0.44–1.00)
GFR, Estimated: 52 mL/min — ABNORMAL LOW (ref >60)
Glucose, Bld: 85 mg/dL (ref 70–99)
Potassium: 3.1 mmol/L — ABNORMAL LOW (ref 3.5–5.1)
Sodium: 138 mmol/L (ref 135–145)

## 2024-03-19 MED ORDER — POTASSIUM CHLORIDE 20 MEQ PO PACK
40.0000 meq | PACK | Freq: Two times a day (BID) | ORAL | Status: DC
  Administered 2024-03-19 – 2024-03-20 (×3): 40 meq via ORAL
  Filled 2024-03-19 (×3): qty 2

## 2024-03-19 MED ORDER — FUROSEMIDE 10 MG/ML IJ SOLN
40.0000 mg | Freq: Two times a day (BID) | INTRAMUSCULAR | Status: DC
  Administered 2024-03-19 – 2024-03-20 (×3): 40 mg via INTRAVENOUS
  Filled 2024-03-19 (×3): qty 4

## 2024-03-19 MED ORDER — FLUDROCORTISONE ACETATE 0.1 MG PO TABS
0.1000 mg | ORAL_TABLET | Freq: Two times a day (BID) | ORAL | Status: DC
  Administered 2024-03-20: 0.1 mg via ORAL
  Filled 2024-03-19 (×3): qty 1

## 2024-03-19 NOTE — Plan of Care (Signed)
  Problem: Education: Goal: Knowledge of General Education information will improve Description: Including pain rating scale, medication(s)/side effects and non-pharmacologic comfort measures 03/19/2024 1106 by Cheril Cork, RN Outcome: Progressing 03/19/2024 1105 by Cheril Cork, RN Outcome: Progressing   Problem: Health Behavior/Discharge Planning: Goal: Ability to manage health-related needs will improve 03/19/2024 1106 by Cheril Cork, RN Outcome: Progressing 03/19/2024 1105 by Cheril Cork, RN Outcome: Progressing   Problem: Clinical Measurements: Goal: Ability to maintain clinical measurements within normal limits will improve 03/19/2024 1106 by Cheril Cork, RN Outcome: Progressing 03/19/2024 1105 by Cheril Cork, RN Outcome: Progressing Goal: Will remain free from infection 03/19/2024 1106 by Cheril Cork, RN Outcome: Progressing 03/19/2024 1105 by Cheril Cork, RN Outcome: Progressing Goal: Diagnostic test results will improve 03/19/2024 1106 by Cheril Cork, RN Outcome: Progressing 03/19/2024 1105 by Cheril Cork, RN Outcome: Progressing Goal: Respiratory complications will improve 03/19/2024 1106 by Cheril Cork, RN Outcome: Progressing 03/19/2024 1105 by Cheril Cork, RN Outcome: Progressing Goal: Cardiovascular complication will be avoided 03/19/2024 1106 by Cheril Cork, RN Outcome: Progressing 03/19/2024 1105 by Cheril Cork, RN Outcome: Progressing   Problem: Activity: Goal: Risk for activity intolerance will decrease 03/19/2024 1106 by Cheril Cork, RN Outcome: Progressing 03/19/2024 1105 by Cheril Cork, RN Outcome: Progressing   Problem: Nutrition: Goal: Adequate nutrition will be maintained 03/19/2024 1106 by Cheril Cork, RN Outcome: Progressing 03/19/2024 1105 by Cheril Cork, RN Outcome: Progressing   Problem: Coping: Goal: Level  of anxiety will decrease 03/19/2024 1106 by Cheril Cork, RN Outcome: Progressing 03/19/2024 1105 by Cheril Cork, RN Outcome: Progressing   Problem: Elimination: Goal: Will not experience complications related to bowel motility 03/19/2024 1106 by Cheril Cork, RN Outcome: Progressing 03/19/2024 1105 by Cheril Cork, RN Outcome: Progressing Goal: Will not experience complications related to urinary retention 03/19/2024 1106 by Cheril Cork, RN Outcome: Progressing 03/19/2024 1105 by Cheril Cork, RN Outcome: Progressing   Problem: Pain Managment: Goal: General experience of comfort will improve and/or be controlled 03/19/2024 1106 by Cheril Cork, RN Outcome: Progressing 03/19/2024 1105 by Cheril Cork, RN Outcome: Progressing   Problem: Safety: Goal: Ability to remain free from injury will improve 03/19/2024 1106 by Cheril Cork, RN Outcome: Progressing 03/19/2024 1105 by Cheril Cork, RN Outcome: Progressing   Problem: Skin Integrity: Goal: Risk for impaired skin integrity will decrease 03/19/2024 1106 by Cheril Cork, RN Outcome: Progressing 03/19/2024 1105 by Cheril Cork, RN Outcome: Progressing

## 2024-03-19 NOTE — Progress Notes (Signed)
 Progress Note   Patient: Tricia Edwards ZHY:[id number] DOB: [birth year] DOA: 03/18/2024     0 DOS: the patient was seen and examined on 03/19/2024   Brief hospital course:  89 y.o. female with medical history significant of PAF s/p cardioversion yesterday, chronic HFpEF, COPD, HTN, CKD stage IIIa, chronic iron deficiency anemia, orthostatic hypotension, trigeminal neuralgia, presented with worsening of shortness of breath.   Assessment and Plan:  Acute hypoxic respiratory failure - Patient initially requiring 2-4 L nasal cannula to maintain O2 sats greater than 90%.  Attempting to wean to room air however noting ambulatory O2 sat 84%.  Patient does not use supplemental O2 at baseline.  Will attempt to wean O2 as tolerated.  Acute exacerbation of HFpEF - Likely secondary to previous uncontrolled A-fib, status post DCCV 4/30.  Responding well to IV diuresis, negative greater than 1 L so far.  Improving oxygenation.  Continue IV Lasix  40 mg twice daily.  Fluid restriction, monitor urine output.  Supplemental O2 as above.  Will recheck BMP and mag in AM.  Paroxysmal atrial fibrillation - Status post DCCV 4/30.  Continues NSR.  Amiodarone  and Eliquis  on board.  Orthostatic hypotension - Patient takes midodrine  and fludrocortisone .  Patient and family aware, using precautions when sitting up in bed and ambulating.  Trigeminal neuralgia - Stable.  Gabapentin  and Trileptal  on board.  Physical debilitation muscle weakness - Likely exacerbated by above.  Will order PT consult.  However patient's daughter cares for the patient 24/7.  Very motivated and helps with total care.  Subjective: Patient resting comfortably this morning.  Feeling improved, weaned down to room air.  Denies any worsening shortness of breath, chest pain, nausea, vomiting, abdominal pain.  Daughter at bedside, explained that she provides 24/7 care for the patient, helps her with ambulation, blood pressure checks, feeding,  etc.  Physical Exam: Vitals:   03/19/24 0556 03/19/24 0739 03/19/24 0853 03/19/24 1146  BP:  (!) 156/63  (!) 127/58  Pulse:  (!) 59  (!) 57  Resp:      Temp:  98.4 F (36.9 C)  98.2 F (36.8 C)  TempSrc:  Oral    SpO2: 97% 92% 94% 97%  Weight:      Height:        GENERAL:  Alert, pleasant, no acute distress, frail HEENT:  EOMI CARDIOVASCULAR:  RRR, no murmurs appreciated RESPIRATORY:  Clear to auscultation, no wheezing, rales, or rhonchi GASTROINTESTINAL:  Soft, nontender, nondistended EXTREMITIES:  No LE edema bilaterally NEURO:  No new focal deficits appreciated SKIN:  No rashes noted PSYCH:  Appropriate mood and affect     Data Reviewed:  No new imaging to review  Labs: CBC: Recent Labs  Lab 03/18/24 0659  WBC 7.8  NEUTROABS 5.3  HGB 10.6*  HCT 33.2*  MCV 97.9  PLT 199   Basic Metabolic Panel: Recent Labs  Lab 03/18/24 0659 03/19/24 0602  NA 141 138  K 4.0 3.1*  CL 105 98  CO2 26 32  GLUCOSE 105* 85  BUN 26* 21  CREATININE 1.23* 1.02*  CALCIUM  8.9 8.2*   Liver Function Tests: Recent Labs  Lab 03/18/24 0659  AST 27  ALT 26  ALKPHOS 111  BILITOT 0.7  PROT 6.5  ALBUMIN 3.5   CBG: No results for input(s): "GLUCAP" in the last 168 hours.  Scheduled Meds:  amiodarone   200 mg Oral BID   apixaban   5 mg Oral BID   atorvastatin   10 mg Oral Daily  fludrocortisone   200 mcg Oral BID   furosemide   40 mg Intravenous BID   gabapentin   300 mg Oral Daily   And   gabapentin   600 mg Oral QHS   midodrine   10 mg Oral TID   Oxcarbazepine   300 mg Oral BID   pantoprazole   40 mg Oral Daily   potassium chloride   40 mEq Oral BID   senna  1 tablet Oral q AM   sodium chloride  flush  3 mL Intravenous Q12H   Continuous Infusions: PRN Meds:.acetaminophen , ondansetron  (ZOFRAN ) IV, polyethylene glycol, sodium chloride  flush  Family Communication: Family at bedside  Disposition: Status is: Observation The patient will require care spanning > 2 midnights  and should be moved to inpatient because: Acute hypoxic respiratory failure with acute HFpEF exacerbation     Time spent: 37 minutes  Author: Jodeane Mulligan, DO 03/19/2024 12:28 PM  For on call review [email or web address].

## 2024-03-19 NOTE — Plan of Care (Signed)

## 2024-03-19 NOTE — Progress Notes (Signed)
 Heart Failure Nurse Navigator Progress Note  PCP: [facility] PCP-Cardiologist: Tricia Fonder, MD Admission Diagnosis: Dyspnea CHF Exacerbation Admitted from: Home  Presentation:   Tricia Edwards presented with worsening shortness of breath and difficulty breathing.  Her daughter Tricia Edwards was at the bedside and also cares for her at home.  Patient had paroxysmal atrial fibrillation and underwent a recent Cardioversion 03/17/24 which was successful. Overnight the patient started experiencing worsening shortness of breath.  Patient denied any cough, chest pain, fever or chills. Daughter had been checking her pulse oximetry at home and was in the 70's- 80's so they went to the Emergency Department. Also noted that patient had some slight expiratory wheezing. BNP 396.1. Troponin 15. Chest x-ray: Onset of CHF with basal predominant interstitial edema. Small pleural effusions beginning to form.  Patchy haziness in th lower lung fields (ground glass edema or pneumonitis). Aortic atherosclerosis.  ECHO/ LVEF: 60-65%  Clinical Course:  Past Medical History:  Diagnosis Date   Benign paroxysmal positional vertigo    CHF (congestive heart failure) (HCC)    Chronic diastolic (congestive) heart failure (HCC)    Chronic orthostatic hypotension    COPD (chronic obstructive pulmonary disease) (HCC)    Dry eyes    GERD (gastroesophageal reflux disease)    Hepatic steatosis    History of GI bleed    Hx of syncope    Hypertension    Mild aortic stenosis by prior echocardiogram    Mild concentric left ventricular hypertrophy (LVH)    Moderate mitral regurgitation by prior echocardiogram    Orthostatic hypotension    Paroxysmal A-fib (HCC)    Stable angina pectoris (HCC)    Stage 3a chronic kidney disease (CKD) (HCC)    TIA (transient ischemic attack)    no deficits   Trigeminal neuralgia    Wears dentures    full upper and lower   Wears hearing aid in both ears    has both.   Only wears right     Social History   Socioeconomic History   Marital status: Widowed    Spouse name: Not on file   Number of children: Not on file   Years of education: Not on file   Highest education level: Not on file  Occupational History   Not on file  Tobacco Use   Smoking status: Former    Current packs/day: 0.00    Average packs/day: 0.1 packs/day for 35.0 years (3.5 ttl pk-yrs)    Types: Cigarettes    Start date: 61    Quit date: 8    Years since quitting: 40.3   Smokeless tobacco: Never  Vaping Use   Vaping status: Never Used  Substance and Sexual Activity   Alcohol use: Not Currently   Drug use: Never   Sexual activity: Not Currently  Other Topics Concern   Not on file  Social History Narrative   Not on file   Social Drivers of Health   Financial Resource Strain: Not on File (12/24/2019)   Received from [employer], [employer]    Financial Resource Strain: 0  Food Insecurity: No Food Insecurity (03/19/2024)   Hunger Vital Sign    Worried About Running Out of Food in the Last Year: Never true    Ran Out of Food in the Last Year: Never true  Transportation Needs: Unknown (03/19/2024)   PRAPARE - Administrator, Civil Service (Medical): Patient declined    Lack of Transportation (  Non-Medical): No  Physical Activity: Not on File (12/24/2019)   Received from [place], [place]   Physical Activity    Physical Activity: 0  Stress: Not on File (12/24/2019)   Received from [facility]   Stress    Stress: 0  Social Connections: Not on File (12/24/2019)   Received from [place], [place]   Social Connections    Social Connections and Isolation: 0   Education Assessment and Provision:  Detailed education and instructions provided on heart failure disease management including the following:  Signs and symptoms of Heart Failure When to call the physician Importance of daily weights Low sodium diet Fluid restriction Medication  management Anticipated future follow-up appointments  Patient education given on each of the above topics.  Patient acknowledges understanding via teach back method and acceptance of all instructions.  Education Materials:  "Living Better With Heart Failure" Booklet, HF zone tool, & Daily Weight Tracker Tool.  Patient has scale at home: Yes Patient has pill box at home: Yes    High Risk Criteria for Readmission and/or Poor Patient Outcomes: Heart failure hospital admissions (last 6 months): 1  No Show rate: 0 Difficult social situation: None Demonstrates medication adherence: Yes Primary Language: English Literacy level: Reading, Writing & Comprehension  Barriers of Care:   Patient is Hearing Impaired and uses hearing aids  Considerations/Referrals:   Referral made to Heart Failure Pharmacist Stewardship: Yes Referral made to Heart Failure CSW/NCM TOC:  No Referral made to Heart & Vascular TOC clinic: Yes. TOC 03/24/2024 @ 11:30 AM  Items for Follow-up on DC/TOC: Diet & Fluid Restrictions-Patient is following a low sodium diet and and drinking less than 64 oz of fluid per day Daily Weights-Patient already performs daily weights Continued Heart Failure Education  Tricia Coil, RN, BSN [facility] Heart Failure Navigator Secure Chat Only

## 2024-03-19 NOTE — Care Management Obs Status (Signed)
 MEDICARE OBSERVATION STATUS NOTIFICATION   Patient Details  Name: Tricia Edwards MRN: [id number] Date of Birth: 1930-04-19   Medicare Observation Status Notification Given:  Yes    [name] 03/19/2024, 11:02 AM

## 2024-03-19 NOTE — Interval H&P Note (Signed)
 History and Physical Interval Note:  03/19/2024 3:41 PM  Tricia Edwards  has presented today for surgery, with the diagnosis of Cardioversion   Afib.  The various methods of treatment have been discussed with the patient and family. After consideration of risks, benefits and other options for treatment, the patient has consented to  Procedure(s): CARDIOVERSION (N/A) as a surgical intervention.  The patient's history has been reviewed, patient examined, no change in status, stable for surgery.  I have reviewed the patient's chart and labs.  Questions were answered to the patient's satisfaction.     Brandley Aldrete

## 2024-03-19 NOTE — H&P (Signed)

## 2024-03-19 NOTE — Hospital Course (Signed)
 89 y.o. female with medical history significant of PAF s/p cardioversion yesterday, chronic HFpEF, COPD, HTN, CKD stage IIIa, chronic iron deficiency anemia, orthostatic hypotension, trigeminal neuralgia, presented with worsening of shortness of breath.    Assessment and Plan:   Acute hypoxic respiratory failure - Patient initially requiring 2-4 L nasal cannula to maintain O2 sats greater than 90%.  Attempting to wean to room air however noting ambulatory O2 sat 84%.  Patient does not use supplemental O2 at baseline.  Will attempt to wean O2 as tolerated.   Acute exacerbation of HFpEF - Likely secondary to previous uncontrolled A-fib, status post DCCV 4/30.  Responding well to IV diuresis, negative greater than 1 L so far.  Improving oxygenation.  Continue IV Lasix  40 mg twice daily.  Fluid restriction, monitor urine output.  Supplemental O2 as above.  Will recheck BMP and mag in AM.   Paroxysmal atrial fibrillation - Status post DCCV 4/30.  Continues NSR.  Amiodarone  and Eliquis  on board.   Orthostatic hypotension - Patient takes midodrine  and fludrocortisone .  Patient and family aware, using precautions when sitting up in bed and ambulating.   Trigeminal neuralgia - Stable.  Gabapentin  and Trileptal  on board.   Physical debilitation muscle weakness - Likely exacerbated by above.  Will order PT consult.  However patient's daughter cares for the patient 24/7.  Very motivated and helps with total care.

## 2024-03-20 DIAGNOSIS — J9601 Acute respiratory failure with hypoxia: Secondary | ICD-10-CM | POA: Diagnosis not present

## 2024-03-20 DIAGNOSIS — I5033 Acute on chronic diastolic (congestive) heart failure: Secondary | ICD-10-CM | POA: Diagnosis not present

## 2024-03-20 LAB — BASIC METABOLIC PANEL WITH GFR
Anion gap: 11 (ref 5–15)
BUN: 22 mg/dL (ref 8–23)
CO2: 34 mmol/L — ABNORMAL HIGH (ref 22–32)
Calcium: 8.2 mg/dL — ABNORMAL LOW (ref 8.9–10.3)
Chloride: 95 mmol/L — ABNORMAL LOW (ref 98–111)
Creatinine, Ser: 1.06 mg/dL — ABNORMAL HIGH (ref 0.44–1.00)
GFR, Estimated: 50 mL/min — ABNORMAL LOW (ref >60)
Glucose, Bld: 92 mg/dL (ref 70–99)
Potassium: 3.1 mmol/L — ABNORMAL LOW (ref 3.5–5.1)
Sodium: 140 mmol/L (ref 135–145)

## 2024-03-20 LAB — MAGNESIUM: Magnesium: 2.1 mg/dL (ref 1.7–2.4)

## 2024-03-20 LAB — CBC
HCT: 31.7 % — ABNORMAL LOW (ref 36.0–46.0)
Hemoglobin: 10 g/dL — ABNORMAL LOW (ref 12.0–15.0)
MCH: 30.6 pg (ref 26.0–34.0)
MCHC: 31.5 g/dL (ref 30.0–36.0)
MCV: 96.9 fL (ref 80.0–100.0)
Platelets: 208 10*3/uL (ref 150–400)
RBC: 3.27 MIL/uL — ABNORMAL LOW (ref 3.87–5.11)
RDW: 13.7 % (ref 11.5–15.5)
WBC: 5 10*3/uL (ref 4.0–10.5)
nRBC: 0 % (ref 0.0–0.2)

## 2024-03-20 NOTE — Discharge Summary (Signed)
 Physician Discharge Summary   Patient: Tricia Edwards MRN: [id number] DOB: 1930-10-15  Admit date:     03/18/2024  Discharge date: 03/20/24  Discharge Physician: Jodeane Mulligan   PCP: [facility]   Recommendations at discharge:    Pt to be discharged home.   If you experience worsening fever, chills, chest pain, shortness of breath, or other concerning symptoms, please call your PCP or go to the emergency department immediately.  Discharge Diagnoses: Principal Problem:   CHF (congestive heart failure) (HCC) Active Problems:   Acute on chronic diastolic CHF (congestive heart failure) (HCC)   Acute respiratory failure with hypoxia (HCC)   Acute on chronic heart failure with preserved ejection fraction (HFpEF) (HCC)  Resolved Problems:   * No resolved hospital problems. *   Hospital Course:  89 y.o. female with medical history significant of PAF s/p cardioversion yesterday, chronic HFpEF, COPD, HTN, CKD stage IIIa, chronic iron deficiency anemia, orthostatic hypotension, trigeminal neuralgia, presented with worsening of shortness of breath.    Assessment and Plan:   Acute hypoxic respiratory failure - Patient initially requiring 2-4 L nasal cannula to maintain O2 sats greater than 90%.  Able to be weaned down to room air throughout the course of hospital stay.  Hypoxia resolved.    Acute exacerbation of HFpEF - Likely secondary to previous uncontrolled A-fib, status post DCCV 4/30.  Responded well to IV diuresis.  Hypoxia resolved.  Upon discharge will recommend patient take her home p.o. furosemide  20 mg daily for the next 3 days then transition back to daily as needed.  Resume previous cardiac regimen.  Paroxysmal atrial fibrillation - Status post DCCV 4/30.  Continues NSR.  Continue home amiodarone  and Eliquis .   Orthostatic hypotension - Patient takes midodrine  and fludrocortisone .  Patient and family aware, using precautions when sitting up in bed and  ambulating.   Trigeminal neuralgia - Stable.  Gabapentin  and Trileptal  on board.   Physical debilitation muscle weakness - Likely exacerbated by above.  However patient's daughter cares for the patient 24/7.  Very motivated and helps with total care.   Consultants: None Procedures performed: None Disposition: Home Diet recommendation:  Discharge Diet Orders (From admission, onward)     Start     Ordered   03/20/24 0000  Diet - low sodium heart healthy        03/20/24 0941           Cardiac diet  DISCHARGE MEDICATION: Allergies as of 03/20/2024       Reactions   Pneumovax [pneumococcal Polysaccharide Vaccine]    Other reaction(s): Severe arm swelling        Medication List     TAKE these medications    amiodarone  200 MG tablet Commonly known as: PACERONE  Take 400 MG (2 tablets) twice daily for one week. Then decrease to 200 MG ( 1 tablet) twice daily. What changed:  how much to take how to take this when to take this additional instructions   atorvastatin  10 MG tablet Commonly known as: LIPITOR Take 10 mg by mouth daily.   CALCIUM  600/VITAMIN D  PO Take 1 tablet by mouth daily. 600 mg / 20 mcg   cyanocobalamin 1000 MCG tablet Take 1,000 mcg by mouth daily.   Eliquis  5 MG Tabs tablet Generic drug: apixaban  Take 5 mg by mouth 2 (two) times daily.   ferrous sulfate  324 MG Tbec Take 324 mg by mouth 2 (two) times daily.   fludrocortisone  0.1 MG tablet Commonly known  as: FLORINEF  Take 2 tablets (200 mcg total) by mouth 2 (two) times daily. What changed: how much to take   furosemide  20 MG tablet Commonly known as: LASIX  Take 1 tablet (20 mg total) by mouth every Monday, Wednesday, and Friday. May take 1 tablet ( 20 MG) daily as needed   gabapentin  300 MG capsule Commonly known as: NEURONTIN  Take 300-600 mg by mouth 3 (three) times daily. Take 300 in the morning and afternoon and 600 mg at bedtime   ipratropium-albuterol  0.5-2.5 (3) MG/3ML  Soln Commonly known as: DUONEB Take 3 mLs by nebulization every 8 (eight) hours as needed (shortness of breath).   midodrine  10 MG tablet Commonly known as: PROAMATINE  Take 10 mg by mouth 3 (three) times daily. If Bp is 150-160 take 5 mg of over take nothing   MiraLax  17 GM/SCOOP powder Generic drug: polyethylene glycol powder Take 17 g by mouth daily as needed for mild constipation.   omeprazole  20 MG capsule Commonly known as: PRILOSEC Take 1 capsule (20 mg total) by mouth 2 (two) times daily before a meal. What changed: when to take this   Oxcarbazepine  300 MG tablet Commonly known as: TRILEPTAL  Take 300 mg by mouth 2 (two) times daily.   potassium chloride  SA 20 MEQ tablet Commonly known as: KLOR-CON  M Take 20 mEq by mouth in the morning.   prednisoLONE-Moxifloxacin  1-0.5 % Soln Place 1 drop into both eyes 2 (two) times daily. With Bromfenac 0.075%   PreserVision AREDS Tabs Take 1 tablet by mouth 2 (two) times daily.   senna 8.6 MG Tabs tablet Commonly known as: SENOKOT Take 1 tablet by mouth in the morning.        Follow-up Information     [facility]. Go on 03/24/2024.   Specialty: Cardiology Why: Hospital Follow-Up 03/24/24 @ 11:30 AM Please bring medication list with you to the appointment. [facility], Second Floor, [address] Free Valet Parking at the [employer] information: [address] [place]  [phone]                Discharge Exam: Cleavon Curls Weights   03/18/24 9147 03/20/24 0456  Weight: 80 kg 79.6 kg    GENERAL:  Alert, pleasant, no acute distress, frail HEENT:  EOMI CARDIOVASCULAR:  RRR, no murmurs appreciated RESPIRATORY:  Clear to auscultation, no wheezing, rales, or rhonchi GASTROINTESTINAL:  Soft, nontender, nondistended EXTREMITIES:  No LE edema bilaterally NEURO:  No new focal deficits appreciated SKIN:  No rashes noted PSYCH:   Appropriate mood and affect    Condition at discharge: improving  The results of significant diagnostics from this hospitalization (including imaging, microbiology, ancillary and laboratory) are listed below for reference.   Imaging Studies: DG Chest 1 View Result Date: 03/19/2024 CLINICAL DATA:  [id number]. Congestive heart failure. Status post cardioversion yesterday. EXAM: CHEST  1 VIEW COMPARISON:  Portable chest yesterday at 7:02 a.m. FINDINGS: 6:49 a.m. Interstitial edema with a basal gradient continues to be seen as well as small pleural effusions. Patchy ground-glass opacities of the lower lung fields also again noted which could be ground-glass edema or pneumonia. Overall aeration is unchanged with no new or further focal infiltrates. There is a tortuous and calcified aorta with a stable mediastinum, stable mild cardiomegaly. No new osseous findings. thoracic spondylosis. Chronic rotator cuff arthropathy. IMPRESSION: 1. No significant change overall since yesterday. 2. Interstitial edema with a basal gradient and small pleural effusions. 3. Patchy  ground-glass opacities of the lower lung fields which could be ground-glass edema or pneumonia. Electronically Signed   By: Denman Fischer M.D.   On: 03/19/2024 07:50   DG Chest Portable 1 View Result Date: 03/18/2024 CLINICAL DATA:  Shortness of breath. EXAM: PORTABLE CHEST 1 VIEW COMPARISON:  AP Lat chest 09/06/2023 FINDINGS: There is mild cardiomegaly, slightly increased. Interval new perihilar vascular congestion with mild interstitial edema showing a basal gradient. Small pleural effusions are beginning to form. There is patchy haziness in the lower lung fields which could be ground-glass edema or pneumonitis. No other focal lung infiltrate is seen. The mediastinum is stable with aortic patchy calcific plaques with uncoiling. There are bilateral high riding humeral heads suggesting chronic rotator cuff arthropathy. There is thoracic spondylosis.  IMPRESSION: 1. Onset of CHF with basal predominant interstitial edema. 2. Small pleural effusions are beginning to form. 3. Patchy haziness in the lower lung fields which could be ground-glass edema or pneumonitis. 4. Aortic atherosclerosis. Electronically Signed   By: Denman Fischer M.D.   On: 03/18/2024 07:25    Microbiology: Results for orders placed or performed during the hospital encounter of 06/03/23  Urine Culture     Status: Abnormal   Collection Time: 06/03/23  2:48 PM   Specimen: Urine, Clean Catch  Result Value Ref Range Status   Specimen Description   Final    URINE, CLEAN CATCH Performed at [facility], [address] [place], [place] [address]    Special Requests   Final    NONE Performed at [facility], [address], [place], [place] [address]    Culture (A)  Final    <10,000 COLONIES/mL INSIGNIFICANT GROWTH Performed at [facility], [address], [place], [place] [address]    Report Status 06/05/2023 FINAL  Final    Labs: CBC: Recent Labs  Lab 03/18/24 0659 03/20/24 0418  WBC 7.8 5.0  NEUTROABS 5.3  --   HGB 10.6* 10.0*  HCT 33.2* 31.7*  MCV 97.9 96.9  PLT 199 208   Basic Metabolic Panel: Recent Labs  Lab 03/18/24 0659 03/19/24 0602 03/20/24 0418  NA 141 138 140  K 4.0 3.1* 3.1*  CL 105 98 95*  CO2 26 32 34*  GLUCOSE 105* 85 92  BUN 26* 21 22  CREATININE 1.23* 1.02* 1.06*  CALCIUM  8.9 8.2* 8.2*  MG  --   --  2.1   Liver Function Tests: Recent Labs  Lab 03/18/24 0659  AST 27  ALT 26  ALKPHOS 111  BILITOT 0.7  PROT 6.5  ALBUMIN 3.5   CBG: No results for input(s): "GLUCAP" in the last 168 hours.  Discharge time spent: 25 minutes.  Signed: Jodeane Mulligan, DO [facility] 03/20/2024

## 2024-03-20 NOTE — Plan of Care (Signed)
 Continuing with plan of care.

## 2024-03-20 NOTE — Plan of Care (Signed)
 Discharge teaching completed with patient, patient discharged with all belongings and in stable condition.

## 2024-03-23 ENCOUNTER — Telehealth: Payer: Self-pay | Admitting: Family

## 2024-03-23 NOTE — Telephone Encounter (Signed)
 Called to confirm/remind patient of their appointment at the [facility] on 03/24/24.   Appointment:   [x] Confirmed  [] Left mess   [] No answer/No voice mail  [] VM Full/unable to leave message  [] Phone not in service  Patient reminded to bring all medications and/or complete list.  Confirmed patient has transportation. Gave directions, instructed to utilize valet parking.

## 2024-03-23 NOTE — Progress Notes (Unsigned)
 Advanced [facility] Note   Referring Physician: recent admission PCP: [facility] (last seen 04/25) Cardiologist: Belva Boyden, MD / Ronald Cockayne, NP (last seen 04/25)  Chief Complaint: shortness of breath   HPI:  Tricia Edwards is a 89 y/o female with a history of PAF (successful CV 03/17/24), hypotension/ HTN, COPD, hepatic steatosis, CKD, TIA, trigeminal neuralgia, IDA and chronic heart failure.    Echo 06/01/23: EF 60-65% with mild LVH, normal RV, moderate LAE, mild/ moderate MR, mila AS  Was in the ED 01/23/24 with syncope. One week prior, she had been started on lasix , levaquin and steroids. Head CT without acute findings. Labs stable.   Admitted 03/18/24 with worsening shortness of breath after DCCV the day prior. Pulse ox at home was 70's-80's. BNP 396.1. Troponin 15. Chest x-ray: Onset of CHF with basal predominant interstitial edema. Small pleural effusions beginning to form.  Patchy haziness in th lower lung fields (ground glass edema or pneumonitis). Initially required oxygen but weaned down to room air. IV diuresed with resolution of hypoxia.   She presents today, with her daughter, for her initial HF visit with a chief complaint of shortness of breath. Has associated fatigue, productive cough, dizziness with low blood pressure and headaches with high blood pressure. Denies chest pain, palpitations, difficulty sleeping or pedal edema. Sleeping well on 1 pillow. Wearing compression socks daily.    Review of Systems: [y] = yes, [ ]  = no   General: Weight gain [ ] ; Weight loss [ ] ; Anorexia [ ] ; Fatigue Davis.Dad ]; Fever [ ] ; Chills [ ] ; Weakness [ ]   Cardiac: Chest pain/pressure [ ] ; Resting SOB [ ] ; Exertional SOB Davis.Dad ]; Orthopnea [ ] ; Pedal Edema [ ] ; Palpitations [ ] ; Syncope [ ] ; Presyncope [ ] ; Paroxysmal nocturnal dyspnea[ ]   Pulmonary: Cough Davis.Dad ]; Wheezing[ ] ; Hemoptysis[ ] ; Sputum [ ] ; Snoring [ ]   GI: Vomiting[ ] ; Dysphagia[ ] ; Melena[ ] ; Hematochezia  [ ] ; Heartburn[ ] ; Abdominal pain [ ] ; Constipation [ ] ; Diarrhea [ ] ; BRBPR [ ]   GU: Hematuria[ ] ; Dysuria [ ] ; Nocturia[ ]   Vascular: Pain in legs with walking [ ] ; Pain in feet with lying flat [ ] ; Non-healing sores [ ] ; Stroke [ ] ; TIA [ ] ; Slurred speech [ ] ;  Neuro: Dizziness Davis.Dad ]; Headache Davis.Dad ]; Seizures[ ] ; Paresthesias[ ] ;Blurred vision [ ] ; Diplopia [ ] ; Vision changes [ ]   Ortho/Skin: Arthritis [ ] ; Joint pain [ ] ; Muscle pain [ ] ; Joint swelling [ ] ; Back Pain [ ] ; Rash [ ]   Psych: Depression[ ] ; Anxiety[ ]   Heme: Bleeding problems [ ] ; Clotting disorders [ ] ; Anemia Davis.Dad ]  Endocrine: Diabetes [ ] ; Thyroid  dysfunction[ ]    Past Medical History:  Diagnosis Date   Benign paroxysmal positional vertigo    CHF (congestive heart failure) (HCC)    Chronic diastolic (congestive) heart failure (HCC)    Chronic orthostatic hypotension    COPD (chronic obstructive pulmonary disease) (HCC)    Dry eyes    GERD (gastroesophageal reflux disease)    Hepatic steatosis    History of GI bleed    Hx of syncope    Hypertension    Mild aortic stenosis by prior echocardiogram    Mild concentric left ventricular hypertrophy (LVH)    Moderate mitral regurgitation by prior echocardiogram    Orthostatic hypotension    Paroxysmal A-fib (HCC)    Stable angina pectoris (HCC)    Stage 3a chronic kidney disease (CKD) (  HCC)    TIA (transient ischemic attack)    no deficits   Trigeminal neuralgia    Wears dentures    full upper and lower   Wears hearing aid in both ears    has both.  Only wears right    Current Outpatient Medications  Medication Sig Dispense Refill   amiodarone  (PACERONE ) 200 MG tablet Take 400 MG (2 tablets) twice daily for one week. Then decrease to 200 MG ( 1 tablet) twice daily. (Patient taking differently: Take 200 mg by mouth 2 (two) times daily.) 90 tablet 3   atorvastatin  (LIPITOR) 10 MG tablet Take 10 mg by mouth daily.     Calcium  Carb-Cholecalciferol  (CALCIUM   600/VITAMIN D  PO) Take 1 tablet by mouth daily. 600 mg / 20 mcg     cyanocobalamin 1000 MCG tablet Take 1,000 mcg by mouth daily.     ELIQUIS  5 MG TABS tablet Take 5 mg by mouth 2 (two) times daily.     ferrous sulfate  324 MG TBEC Take 324 mg by mouth 2 (two) times daily.     fludrocortisone  (FLORINEF ) 0.1 MG tablet Take 2 tablets (200 mcg total) by mouth 2 (two) times daily. (Patient taking differently: Take 100 mcg by mouth 2 (two) times daily.) 120 tablet 6   furosemide  (LASIX ) 20 MG tablet Take 1 tablet (20 mg total) by mouth every Monday, Wednesday, and Friday. May take 1 tablet ( 20 MG) daily as needed 60 tablet 1   gabapentin  (NEURONTIN ) 300 MG capsule Take 300-600 mg by mouth 3 (three) times daily. Take 300 in the morning and afternoon and 600 mg at bedtime     ipratropium-albuterol  (DUONEB) 0.5-2.5 (3) MG/3ML SOLN Take 3 mLs by nebulization every 8 (eight) hours as needed (shortness of breath).     midodrine  (PROAMATINE ) 10 MG tablet Take 10 mg by mouth 3 (three) times daily. If Bp is 150-160 take 5 mg of over take nothing     Multiple Vitamins-Minerals (PRESERVISION AREDS) TABS Take 1 tablet by mouth 2 (two) times daily.     omeprazole  (PRILOSEC) 20 MG capsule Take 1 capsule (20 mg total) by mouth 2 (two) times daily before a meal. (Patient taking differently: Take 20 mg by mouth in the morning.) 60 capsule 0   Oxcarbazepine  (TRILEPTAL ) 300 MG tablet Take 300 mg by mouth 2 (two) times daily.     polyethylene glycol powder (MIRALAX ) 17 GM/SCOOP powder Take 17 g by mouth daily as needed for mild constipation.     potassium chloride  SA (KLOR-CON  M) 20 MEQ tablet Take 20 mEq by mouth in the morning.     prednisoLONE-Moxifloxacin  1-0.5 % SOLN Place 1 drop into both eyes 2 (two) times daily. With Bromfenac 0.075%     senna (SENOKOT) 8.6 MG TABS tablet Take 1 tablet by mouth in the morning.     No current facility-administered medications for this visit.    Allergies  Allergen Reactions    Pneumovax [Pneumococcal Polysaccharide Vaccine]     Other reaction(s): Severe arm swelling      Social History   Socioeconomic History   Marital status: Widowed    Spouse name: Not on file   Number of children: Not on file   Years of education: Not on file   Highest education level: Not on file  Occupational History   Not on file  Tobacco Use   Smoking status: Former    Current packs/day: 0.00    Average packs/day: 0.1 packs/day for 35.0  years (3.5 ttl pk-yrs)    Types: Cigarettes    Start date: 10    Quit date: 74    Years since quitting: 40.3   Smokeless tobacco: Never  Vaping Use   Vaping status: Never Used  Substance and Sexual Activity   Alcohol use: Not Currently   Drug use: Never   Sexual activity: Not Currently  Other Topics Concern   Not on file  Social History Narrative   Not on file   Social Drivers of Health   Financial Resource Strain: Not on File (12/24/2019)   Received from [employer], [place]    Financial Resource Strain: 0  Food Insecurity: No Food Insecurity (03/19/2024)   Hunger Vital Sign    Worried About Running Out of Food in the Last Year: Never true    Ran Out of Food in the Last Year: Never true  Transportation Needs: Unknown (03/19/2024)   PRAPARE - Administrator, Civil Service (Medical): Patient declined    Lack of Transportation (Non-Medical): No  Physical Activity: Not on File (12/24/2019)   Received from [place], [place]   Physical Activity    Physical Activity: 0  Stress: Not on File (12/24/2019)   Received from [facility], [place]   Stress    Stress: 0  Social Connections: Unknown (03/19/2024)   Social Connection and Isolation Panel [NHANES]    Frequency of Communication with Friends and Family: More than three times a week    Frequency of Social Gatherings with Friends and Family: Once a week    Attends Religious Services: Never    Database administrator or Organizations: Not on file    Attends Tax inspector Meetings: Never    Marital Status: Patient unable to answer  Intimate Partner Violence: Not At Risk (03/19/2024)   Humiliation, Afraid, Rape, and Kick questionnaire    Fear of Current or Ex-Partner: No    Emotionally Abused: No    Physically Abused: No    Sexually Abused: No      Family History  Problem Relation Age of Onset   Kidney cancer Sister    Intracerebral hemorrhage Brother    Vitals:   03/24/24 1111  BP: 123/67  Pulse: (!) 51  SpO2: 92%  Weight: 173 lb 3.2 oz (78.6 kg)   Wt Readings from Last 3 Encounters:  03/24/24 173 lb 3.2 oz (78.6 kg)  03/20/24 175 lb 7.8 oz (79.6 kg)  03/17/24 177 lb (80.3 kg)   Lab Results  Component Value Date   CREATININE 1.06 (H) 03/20/2024   CREATININE 1.02 (H) 03/19/2024   CREATININE 1.23 (H) 03/18/2024    PHYSICAL EXAM:  General: Well appearing. No resp difficulty HEENT: normal Neck: supple, no JVD Cor: Regular rhythm, bradycardic. No rubs, gallops or murmurs Lungs: clear Abdomen: soft, nontender, nondistended. Extremities: no cyanosis, clubbing, rash, edema Neuro: alert & oriented X 3. Moves all 4 extremities w/o difficulty. Affect pleasant   ECG: SB w/ 1st degree AV block, HR 53    Assessment & Plan:  1: Chronic heart failure with preserved ejection fraction- - suspect due to BP/ PAF - NYHA class II - euvolemic - weighing daily - Echo 06/01/23: EF 60-65% with mild LVH, normal RV, moderate LAE, mild/ moderate MR, mila AS - continue furosemide  20mg  M, W, F and additional 20mg  PRN - continue potassium 20meq daily - BMET today - BP fluctuates too much for MRA - currently in wheelchair so mobility issues preclude  SGLT2 - continue wearing compression socks daily - BNP 03/18/24 was 396.1  2: HTN/ hypotension- - BP 123/67 - home SBP ranges from 90's- 160's; she gets dizzy when it's low and HA's when it's high - saw PCP ([facility]) 04/25 - continue midodrine  10mg  TID with sliding scale based on  BP - continue wearing compression socks - BMET 03/20/24 reviewed: sodium 140, potassium 3.1, creatinine 1.06 & GFR 50 - BMET today  3: Atrial fibrillation- - DCCV 03/17/24 - saw cardiology ([place]) 04/25 - continue amiodarone  200mg  BID - continue eliquis  5mg  BID - EKG today is SB with 1st degree AV block   Follow closely with cardiology and PRN with us . Patient and daughter were comfortable with this plan.   Charlette Console, FNP 03/23/24

## 2024-03-24 ENCOUNTER — Encounter: Payer: Self-pay | Admitting: Family

## 2024-03-24 ENCOUNTER — Ambulatory Visit: Attending: Family | Admitting: Family

## 2024-03-24 ENCOUNTER — Telehealth: Payer: Self-pay

## 2024-03-24 VITALS — BP 123/67 | HR 51 | Wt 173.2 lb

## 2024-03-24 DIAGNOSIS — I130 Hypertensive heart and chronic kidney disease with heart failure and stage 1 through stage 4 chronic kidney disease, or unspecified chronic kidney disease: Secondary | ICD-10-CM | POA: Insufficient documentation

## 2024-03-24 DIAGNOSIS — I440 Atrioventricular block, first degree: Secondary | ICD-10-CM | POA: Diagnosis not present

## 2024-03-24 DIAGNOSIS — R5383 Other fatigue: Secondary | ICD-10-CM | POA: Diagnosis not present

## 2024-03-24 DIAGNOSIS — I5032 Chronic diastolic (congestive) heart failure: Secondary | ICD-10-CM | POA: Diagnosis present

## 2024-03-24 DIAGNOSIS — Z7901 Long term (current) use of anticoagulants: Secondary | ICD-10-CM | POA: Diagnosis not present

## 2024-03-24 DIAGNOSIS — J90 Pleural effusion, not elsewhere classified: Secondary | ICD-10-CM | POA: Insufficient documentation

## 2024-03-24 DIAGNOSIS — J449 Chronic obstructive pulmonary disease, unspecified: Secondary | ICD-10-CM | POA: Diagnosis not present

## 2024-03-24 DIAGNOSIS — K760 Fatty (change of) liver, not elsewhere classified: Secondary | ICD-10-CM | POA: Diagnosis not present

## 2024-03-24 DIAGNOSIS — I10 Essential (primary) hypertension: Secondary | ICD-10-CM

## 2024-03-24 DIAGNOSIS — Z8673 Personal history of transient ischemic attack (TIA), and cerebral infarction without residual deficits: Secondary | ICD-10-CM | POA: Insufficient documentation

## 2024-03-24 DIAGNOSIS — I340 Nonrheumatic mitral (valve) insufficiency: Secondary | ICD-10-CM | POA: Diagnosis not present

## 2024-03-24 DIAGNOSIS — Z87891 Personal history of nicotine dependence: Secondary | ICD-10-CM | POA: Insufficient documentation

## 2024-03-24 DIAGNOSIS — Z79899 Other long term (current) drug therapy: Secondary | ICD-10-CM | POA: Diagnosis not present

## 2024-03-24 DIAGNOSIS — I480 Paroxysmal atrial fibrillation: Secondary | ICD-10-CM | POA: Insufficient documentation

## 2024-03-24 NOTE — Telephone Encounter (Signed)
 Agree w/ RN assessment. Patient's daughter gave patient her midodrine  in office and felt comfortable taking her home.

## 2024-03-24 NOTE — Patient Instructions (Signed)
 Medication Changes:  No medication changes today!  Lab Work:  Go DOWN to LOWER LEVEL (LL) to have your blood work completed inside of [employer] office.  We will only call you if the results are abnormal or if the provider would like to make medication changes.   Follow-Up in: Please follow up with the [facility] as needed. No need to make an appointment today! If you feel you need to be seen by our clinic just give us  a call to make an appointment.   At the [facility], you and your health needs are our priority. We have a designated team specialized in the treatment of Heart Failure. This Care Team includes your primary Heart Failure Specialized Cardiologist (physician), Advanced Practice Providers (APPs- Physician Assistants and Nurse Practitioners), and Pharmacist who all work together to provide you with the care you need, when you need it.   You may see any of the following providers on your designated Care Team at your next follow up:  Dr. Jules Oar Dr. Peder Bourdon Dr. Alwin Baars Dr. Judyth Nunnery Shawnee Dellen, FNP Bevely Brush, RPH-CPP  Please be sure to bring in all your medications bottles to every appointment.   Need to Contact Us :  If you have any questions or concerns before your next appointment please send us  a message through [place] or call our office at [phone].    TO LEAVE A MESSAGE FOR THE NURSE SELECT OPTION 2, PLEASE LEAVE A MESSAGE INCLUDING: YOUR NAME DATE OF BIRTH CALL BACK NUMBER REASON FOR CALL**this is important as we prioritize the call backs  YOU WILL RECEIVE A CALL BACK THE SAME DAY AS LONG AS YOU CALL BEFORE 4:00 PM

## 2024-03-24 NOTE — Progress Notes (Signed)
 [facility] - HEART FAILURE CLINIC - PHARMACIST COUNSELING NOTE  Tricia Edwards is a 89 y.o. year old female who presents with their daugher to the HF clinic for their follow-up appointment post hospitalization. They have a past medical history significant for PAF s/p cardioversion yesterday, chronic HFpEF, COPD, HTN, CKD stage IIIa, chronic iron deficiency anemia, orthostatic hypotension, trigeminal neuralgia. Initially, they were diagnosed with HFpEF likely due to previously uncontrolled atrial fibrillation. They have recently been hospitalized due to worsening shortness of breath and underwent cardioversion on 4/30 and were continued on amiodarone  and Eliquis . They take midodrine  and fludrocortisone  due to orthostatic hypotension.   Current Guideline-Directed Medical Therapy (GDMT)  ACE/ARB/ARNI: N/A Beta Blocker:  NA Aldosterone Antagonist:  Not a good candidate Diuretic: Furosemide  20 mg MWF, then PRN  SGLT2i:  Not a good candidate   Vital Signs and Trends  Recent Trends BP Readings from Last 3 Encounters:  03/20/24 (!) 140/58  03/17/24 (!) 162/78  03/02/24 (!) 162/96   Wt Readings from Last 3 Encounters:  03/20/24 175 lb 7.8 oz (79.6 kg)  03/17/24 177 lb (80.3 kg)  03/02/24 176 lb 4.8 oz (80 kg)   Today's visit HR 51 BP 123/61 Weight (pounds) 173.2  Do you check your weight daily? [] Yes [x] No  Do you check you blood pressure daily [x] Yes [] No  PTA BP 97 to 160s; like it to be around 130s. They keep close eye on the BPs PTA 171 when can back from hospital. They are stable   Cardiac Imaging  ECHO: Date 06/01/2023, EF 60-65%  Changes Since Last Visit   Hospitalizations/ED visits (last 6 months):  -- Date 03/18/2024, CC worsening SOB Medication changes: NA  Pertinent Labs  Lab Results  Component Value Date   NA 140 03/20/2024   CL 95 (L) 03/20/2024   K 3.1 (L) 03/20/2024   CO2 34 (H) 03/20/2024   BUN 22 03/20/2024   CREATININE 1.06 (H)  03/20/2024   GFRNONAA 50 (L) 03/20/2024   CALCIUM  8.2 (L) 03/20/2024   PHOS 3.6 11/03/2022   ALBUMIN 3.5 03/18/2024   GLUCOSE 92 03/20/2024   CHA2DS2-VASc Score = 5   This indicates a 7.2% annual risk of stroke. The patient's score is based upon: CHF History: 1 HTN History: 1 Diabetes History: 0 Stroke History: 0 Vascular Disease History: 0 Age Score: 2 Gender Score: 1  ASSESSMENT  Reason for today's visit   To assess current guideline directed medical therapy. This is a first-time follow-up after hospitalization. There is limited interventions to be made due to their orthostatic hypotension.   Guideline-Directed Medical Therapy Evaluation  ACEi/ARB/ARNi Patient is not on therapy because not indicated, hypotension  Beta-Blocker  Patient is not on therapy because not indicated, hypotension  MRA Patient is not on therapy because orthostatic hypotension  SGLT-2 Patient is not on therapy because orthostatic hypotension  Loop  Increase dose today  [] Yes [x] No Safety Concerns [x]  Hypotension [x]  Hypokalemia []  Increased Scr   Heart Failure Symptoms & Volume Status   Dyspnea on exertion [] Yes [x] No  Fatigue, sleeps at night, but feels tired during the day  [x] Yes [] No  Lower extremity edema, compression socks [] Yes [x] No  Weight gain (> 5 lbs) [] Yes [x] No  Cough or wheezing; at baseline  [x] Yes [] No  Abdominal bloating/Discomfort [] Yes [x] No  Early satiety or poor appetite [] Yes [x] No  Dizziness of lightheadedness, when run low and headaches when run high  [x] Yes [] No  # Pillows used at night: 1 pillow  at night   Adherence Assessment  Do you ever forget to take your medication? [] Yes [x] No  Do you ever skip doses due to side effects? [] Yes [x] No  Do you have trouble affording your medicines? [] Yes [x] No  Are you ever unable to pick up your medication due to transportation difficulties? [] Yes [x] No  Do you ever stop taking your medications because you  don't believe they are helping? [] Yes [x] No   Adherence strategy:  -- Phone alarm and keep them in there bottles and carry what they need  Barriers to obtaining medications: No barriers identified  Did you take your medications before your appointment today: [x] Yes [] No  Preventative Care   Parameter Most Recent Result Goal/Status Current Medications  LDL 71 < 70 - 55 mg/dL At goal Statin: Atorvastatin  10 mg daily  BP Control Low < 130/80 Low Additional: Fludrocortisone  0.1 mg 1 tablets BID, midodrine  10 mg TID  ECHO 60-65% 6 - 12 months NA   PLAN  Medication Interventions:  -- Continue current regimen per NP -- Patient's orthostatic hypotension limits their ability to be on GDMT  Preventative Care Follow-up: NA Lab or imaging orders:  -- Consider BMET today   Time spent: 15 minutes  Thank you for allowing pharmacy to participate in this patient's care.   Tricia Edwards K Tricia Edwards, Pharm.D. Pharmacy Resident 03/24/2024 8:39 AM

## 2024-03-24 NOTE — Telephone Encounter (Signed)
 Pt arrived back in office via personal wheelchair by pt daughter and another Char[employer] fundraiser. RN stated that she was in the bathroom and the pt "passed out". Pt alert and oriented x4. Pt states that she did not fall or hit/ hurt any part of her body. Pt's daughter states "she does this all the time. Its just time for her medications" vitals obtained. Bp 110/61, heart rate 53. Denies any pain. Pt's daughter states that this is her normal and that she felt comfortable taking her home and did not want to take her to the Emergency Department. No further concerns at this time.

## 2024-03-25 ENCOUNTER — Encounter: Payer: Self-pay | Admitting: Family

## 2024-03-25 LAB — BASIC METABOLIC PANEL WITH GFR
BUN/Creatinine Ratio: 16 (ref 12–28)
BUN: 21 mg/dL (ref 10–36)
CO2: 25 mmol/L (ref 20–29)
Calcium: 8.9 mg/dL (ref 8.7–10.3)
Chloride: 96 mmol/L (ref 96–106)
Creatinine, Ser: 1.32 mg/dL — ABNORMAL HIGH (ref 0.57–1.00)
Glucose: 87 mg/dL (ref 70–99)
Potassium: 4.1 mmol/L (ref 3.5–5.2)
Sodium: 141 mmol/L (ref 134–144)
eGFR: 38 mL/min/{1.73_m2} — ABNORMAL LOW (ref >59)

## 2024-03-27 NOTE — Anesthesia Postprocedure Evaluation (Signed)
 Anesthesia Post Note  Patient: Tricia Edwards  Procedure(s) Performed: CARDIOVERSION  Patient location during evaluation: Specials Recovery Anesthesia Type: General Level of consciousness: awake and alert Pain management: pain level controlled Vital Signs Assessment: post-procedure vital signs reviewed and stable Respiratory status: spontaneous breathing, nonlabored ventilation, respiratory function stable and patient connected to nasal cannula oxygen Cardiovascular status: blood pressure returned to baseline and stable Postop Assessment: no apparent nausea or vomiting Anesthetic complications: no   No notable events documented.   Last Vitals:  Vitals:   03/17/24 0750 03/17/24 0800  BP: (!) 151/87 (!) 162/78  Pulse:    Resp: 15 16  Temp:    SpO2: 100% 96%    Last Pain:  Vitals:   03/17/24 0815  TempSrc:   PainSc: 0-No pain                 Vanice Genre

## 2024-03-30 ENCOUNTER — Encounter: Payer: Self-pay | Admitting: Cardiology

## 2024-03-30 ENCOUNTER — Ambulatory Visit: Admitting: Cardiology

## 2024-03-30 ENCOUNTER — Ambulatory Visit: Attending: Cardiology | Admitting: Cardiology

## 2024-03-30 VITALS — BP 160/82 | HR 57 | Ht 67.0 in | Wt 174.6 lb

## 2024-03-30 DIAGNOSIS — I951 Orthostatic hypotension: Secondary | ICD-10-CM | POA: Diagnosis not present

## 2024-03-30 DIAGNOSIS — I480 Paroxysmal atrial fibrillation: Secondary | ICD-10-CM | POA: Diagnosis not present

## 2024-03-30 DIAGNOSIS — I350 Nonrheumatic aortic (valve) stenosis: Secondary | ICD-10-CM | POA: Diagnosis present

## 2024-03-30 DIAGNOSIS — I10 Essential (primary) hypertension: Secondary | ICD-10-CM | POA: Diagnosis present

## 2024-03-30 DIAGNOSIS — R55 Syncope and collapse: Secondary | ICD-10-CM | POA: Diagnosis present

## 2024-03-30 DIAGNOSIS — E782 Mixed hyperlipidemia: Secondary | ICD-10-CM | POA: Insufficient documentation

## 2024-03-30 DIAGNOSIS — N1832 Chronic kidney disease, stage 3b: Secondary | ICD-10-CM | POA: Diagnosis present

## 2024-03-30 DIAGNOSIS — I5032 Chronic diastolic (congestive) heart failure: Secondary | ICD-10-CM | POA: Diagnosis present

## 2024-03-30 DIAGNOSIS — D509 Iron deficiency anemia, unspecified: Secondary | ICD-10-CM | POA: Insufficient documentation

## 2024-03-30 MED ORDER — AMIODARONE HCL 200 MG PO TABS: 200.0000 mg | ORAL_TABLET | Freq: Two times a day (BID) | ORAL | 3 refills | Status: AC

## 2024-03-30 MED ORDER — FLUDROCORTISONE ACETATE 0.1 MG PO TABS: 0.1000 mg | ORAL_TABLET | Freq: Every day | ORAL | 3 refills | Status: DC

## 2024-03-30 NOTE — Patient Instructions (Signed)
 Medication Instructions:  Your physician recommends the following medication changes.  CHANGE: Amiodarone  200 mg two times dail. Prednisone  eye drops - 1 drop each eye twice daily Florinef  1 tablet daily   *If you need a refill on your cardiac medications before your next appointment, please call your pharmacy*  Lab Work: Your provider would like for you to have following labs drawn today CMP< CBC< MAG< TSH.   If you have labs (blood work) drawn today and your tests are completely normal, you will receive your results only by: MyChart Message (if you have MyChart) OR A paper copy in the mail If you have any lab test that is abnormal or we need to change your treatment, we will call you to review the results.  Testing/Procedures: No test ordered today   Follow-Up: At [facility], you and your health needs are our priority.  As part of our continuing mission to provide you with exceptional heart care, our providers are all part of one team.  This team includes your primary Cardiologist (physician) and Advanced Practice Providers or APPs (Physician Assistants and Nurse Practitioners) who all work together to provide you with the care you need, when you need it.  Your next appointment:   6 week(s)  Provider:   Timothy Gollan, MD

## 2024-03-30 NOTE — Progress Notes (Signed)
 Cardiology Office Note:  .   Date:  03/30/2024  ID:  [name], DOB 1930/06/21, MRN [id number] PCP: [facility]   HeartCare Providers Cardiologist:  None    History of Present Illness: .   Jenielle Matuszak is a 89 y.o. female with a past medical history of persistent atrial fibrillation status post cardioversion (08/2021) and MI, orthostatic hypotension with recurrent syncope, chronic HFpEF, hypertension, hyperlipidemia, who is being seen today for follow-up of her HFpEF and atrial fibrillation.   Patient long history of orthostasis managed by cardiology symmetric juices on high-dose Florinef  with midodrine  3 times daily.  She was admitted in 12/2021 for syncope felt to be vasovagal versus A-fib RVR with drops of blood pressure.  Upon evaluation by cardiology she was back in normal sinus rhythm she had mild troponin elevation felt to be from A-fib RVR with suspected underlying ischemia.  Amiodarone  and digoxin  were held.  Family did not want to pursue ischemic testing.  She was seen in 04/2022 and denied any recurrent episodes of near syncope or syncope.  She was taking Florinef  0.1 mg twice daily and midodrine  10 mg 3 times daily.  She had been wearing thigh-high compression stockings, an abdominal binder and staying hydrated.  Unfortunately she was readmitted in July 2023 for atrial fibrillation with RVR, syncope, and acute heart failure.  It was felt heart failure was exacerbated due to A-fib rates.  She was initially placed on Cardizem  with resolution of A-fib.  She was given 1 dose of Lasix  20 mg.  She was in the emergency department 10/23 for dizziness and atrial fibrillation.  Pulse was reported at 120-160 bpm with a blood pressure 120/92.  EKG showed A-fib with a heart rate of 121.  She was given an injection of cortisone 5 mg and diltiazem  tablet 30 mg.  Dr. Theodis Fiscal was consulted and recommended Cardizem  30 mg twice a day.  She was seen in the clinic 09/06/2022 and  was doing well.  She did continue taking Cardizem  30 mg 3 times a day and appears to be in atrial fibrillation.  EKG demonstrated A-fib at a rate of 77 on that visit.  She was continued on apixaban  5 mg twice daily.  Cardizem  was stopped and she was loaded with amiodarone  400 mg twice daily for 1 week then 200 mg twice daily x 1 week and then 200 mg after.  She was then scheduled for follow-up with Dr. Gollan.  She was evaluated in the emergency department in December for wrist pain and hypertension.  Blood pressure was found to be 192/95, pulse of 61, respirations of 18, temperature 98.3.  She was released home.  She presented back to the [facility] emergency department on 01/23/2024 with complaints of near syncope.  She had 3 episodes per family where she had fallen out.  Patient complained of left-sided numbness with a history of atrial fibrillation.  Blood pressure was found to be 113/78, pulse of 58, respirations of 19, temperature of 98.  Pertinent labs revealed hemoglobin of 9.8, potassium 3.3, serum creatinine 1.24, calcium  8.6, BNP of 506.1.  She was treated with 500 cc normal saline bolus and discharged home.  She was followed in clinic by Dr. Gollan on 02/10/2024 and had been having issues with shortness of breath and wheezing and was subsequently started on furosemide  by her primary care provider.  It was recommended she restart amiodarone  40 mg twice daily for 1 week and then down to 200 mg twice daily.  She was to have an EKG in 2 weeks if she remained in atrial fibrillation consider repeat cardioversion.  She was then seen in clinic/14/25 accompanied by her daughter states overall she was doing well.  She continued to complain of symptoms of fatigue but stated her wheezing had improved.  She was scheduled for upcoming cataract surgery.  She was also scheduled for cardioversion.  Cardioversion was completed on 03/17/2024 which she was shocked 1 time 150 J and converted to sinus rhythm.  There were no  immediate complications and she tolerated the procedure well.   She presented to [facility] emergency department on 03/18/2024 with worsening shortness of breath, acute hypoxic respiratory failure with low O2 sats.  Oxygen has been titrated 2 to 4 L nasal cannula to maintain oxygen saturations greater than 90%.  Throughout her hospital stay was weaned down to room air and her hypoxia resolved.  She had acute exacerbation of HFpEF that responded well to IV diuresis.  She was discharged on home furosemide  20 mg for the next 3 days and then transition back to as needed doses.  She was continued on amiodarone  and apixaban .  She followed with advanced heart failure after hospital discharge from 03/24/2024 continued shortness of breath with associated fatigue, productive cough, dizziness, and low blood pressure.  She was recommended to take furosemide  20 mg Monday Wednesday Friday and additional 20 mg as needed along with potassium 20 mEq daily she was scheduled for BMP at on the same day.  Unfortunately she had fluctuations in her blood pressure that did not allow for initiation of MRA therapy with reduced mobility SGLT2 inhibitors were deferred.  She returns to clinic today accompanied by her daughter.  She continues to have some shortness of breath and fatigue but it has improved since her recent hospitalization.  She has been taking her furosemide  on Monday, Wednesday, and Friday.  Her weight has remained stable.  She continues to wear compression stockings.  She has continued to take her medications as prescribed.  She has recently taken her midodrine  and her Florinef  this morning prior to her appointment so her blood pressure is slightly up but she does drop significantly with her orthostasis with positional changes.  She recently underwent cardioversion procedure and tolerated it well without any adverse side effects.  She did follow-up with advanced heart failure but we will now see her on a as needed basis.  ROS:  10 point review of system has been reviewed and considered negative except ones been listed in the HPI  Studies Reviewed: Aaron Aas   EKG Interpretation Date/Time:  Tuesday Mar 30 2024 08:11:06 EDT Ventricular Rate:  57 PR Interval:  184 QRS Duration:  94 QT Interval:  496 QTC Calculation: 482 R Axis:   -24  Text Interpretation: Sinus bradycardia Minimal voltage criteria for LVH, may be normal variant ( Cornell product ) Nonspecific ST abnormality When compared with ECG of 24-Mar-2024 11:22, PR interval has decreased T wave inversion no longer evident in Anterior leads Confirmed by Ronald Cockayne ([id number]) on 03/30/2024 8:13:30 AM    2D echo 06/01/2023 1. Left ventricular ejection fraction, by estimation, is 60 to 65%. The  left ventricle has normal function. The left ventricle has no regional  wall motion abnormalities. There is mild left ventricular hypertrophy.  Left ventricular diastolic parameters  are indeterminate.   2. Right ventricular systolic function is normal. The right ventricular  size is normal. There is normal pulmonary artery systolic pressure.   3. Left atrial  size was moderately dilated.   4. The mitral valve is normal in structure. Mild to moderate mitral valve  regurgitation. No evidence of mitral stenosis.   5. The aortic valve is normal in structure. Aortic valve regurgitation is  not visualized. Mild aortic valve stenosis. Aortic valve area, by VTI  measures 1.99 cm. Aortic valve mean gradient measures 8.0 mmHg.   6. The inferior vena cava is normal in size with greater than 50%  respiratory variability, suggesting right atrial pressure of 3 mmHg.    2D echo 01/18/2021 1. Left ventricular ejection fraction, by estimation, is 60 to 65%. The  left ventricle has normal function. The left ventricle has no regional  wall motion abnormalities. There is moderate left ventricular hypertrophy.  Left ventricular diastolic  parameters are consistent with Grade I diastolic  dysfunction (impaired  relaxation).   2. Right ventricular systolic function is normal. The right ventricular  size is normal.   3. Left atrial size was mildly dilated.    Risk Assessment/Calculations:    CHA2DS2-VASc Score = 5   This indicates a 7.2% annual risk of stroke. The patient's score is based upon: CHF History: 1 HTN History: 1 Diabetes History: 0 Stroke History: 0 Vascular Disease History: 0 Age Score: 2 Gender Score: 1          Physical Exam:   VS:  BP (!) 160/82 (BP Location: Left Arm, Patient Position: Sitting, Cuff Size: Normal)   Pulse (!) 57   Ht 5\' 7"  (1.702 m)   Wt 174 lb 9.6 oz (79.2 kg)   SpO2 94%   BMI 27.35 kg/m    Wt Readings from Last 3 Encounters:  03/30/24 174 lb 9.6 oz (79.2 kg)  03/24/24 173 lb 3.2 oz (78.6 kg)  03/20/24 175 lb 7.8 oz (79.6 kg)    GEN: Well nourished, well developed in no acute distress NECK: No JVD; No carotid bruits CARDIAC: RRR, bradycardic, no murmurs, rubs, gallops RESPIRATORY:  Clear to auscultation without rales, wheezing or rhonchi  ABDOMEN: Soft, non-tender, non-distended EXTREMITIES:  No edema; No deformity, compression stockings on to bilateral lower extremities  ASSESSMENT AND PLAN: .   Persistent atrial fibrillation maintaining sinus rhythm after DCCV.  EKG today reveals sinus pericardia with a rate of 57 with LVH and nonspecific ST changes.  No significant changes noted from prior studies.  She is continued on apixaban  5 mg twice daily for CHA2DS2-VASc score of at least 5 for stroke prophylaxis.  She is also continued on amiodarone  200 mg daily.  She has been sent for updated lab work of CMP, CBC, mag, and TSH today.  She is not currently on beta-blocker therapy due to baseline bradycardia.  Orthostatic hypotension with a long history of orthostasis and syncope with a blood pressure today of 160/82.  She has taken her Florinef  and midodrine  prior to arrival as she takes Florinef  0.1 mg 1 tablet twice daily and  midodrine  10 mg 3 times daily.  If blood pressures 1 5160 systolic she takes 5 mg of over 160 she does not take her midodrine  at all.  She has also been continued on furosemide  20 mg Monday Wednesday and Friday and as needed.  She has been encouraged to continue to monitor pressures 1 to 2 hours postmedication administration as well.  She has extreme dips in blood pressure as when she is less then 130 systolic then she often times is too weak to stand.  Chronic HFpEF with recent exacerbation requiring hospitalization.  She was diuresed and weaned off of oxygen therapy.  Last echocardiogram was completed 05/2023 with an EF of 60 to 65%, no RWMA, mild LVH, mild MR, mild aortic valve stenosis with aortic valve area measuring 1.99 cm by VTI the aortic valve gradient of 8 mmHg.  We are unable to initiate MRA therapy or SGLT2 inhibitor therapy due to orthostatic hypotension.  She does appear to be euvolemic on exam today.  She continues to suffer from NYHA class II symptoms.  Mixed hyperlipidemia where she continued on atorvastatin  10 mg daily.  Last LDL was 71.  Ongoing management per PCP.  Iron deficiency anemia with a CBC ordered today as she is on blood thinning medications.  She also continues to have ongoing management per PCP.  Mild aortic valve stenosis with aortic valve area by VTI measuring 1.99 cm, aortic valve mean gradient measuring 8.0 mmHg.  Will continue to follow with surveillance studies last echocardiogram can be completed in July 2024.  CKD 3b noted at recent advanced heart failure follow-up with a serum creatinine of 1.32 her baseline serum creatinine is 1.06-1.2.  Repeat labs ordered today with explanation of dehydration and swelling given to daughter and patient.  If continued elevated serum creatinine may have to back off of the diuretic therapy.    Dispo: Patient to return to clinic to see MD/APP in 6 to 8 weeks or sooner for reevaluation  Signed, Breya Cass, NP

## 2024-03-31 LAB — COMPREHENSIVE METABOLIC PANEL WITH GFR
ALT: 36 IU/L — ABNORMAL HIGH (ref 0–32)
AST: 34 IU/L (ref 0–40)
Albumin: 3.9 g/dL (ref 3.6–4.6)
Alkaline Phosphatase: 145 IU/L — ABNORMAL HIGH (ref 44–121)
BUN/Creatinine Ratio: 15 (ref 12–28)
BUN: 17 mg/dL (ref 10–36)
Bilirubin Total: 0.3 mg/dL (ref 0.0–1.2)
CO2: 24 mmol/L (ref 20–29)
Calcium: 8.5 mg/dL — ABNORMAL LOW (ref 8.7–10.3)
Chloride: 100 mmol/L (ref 96–106)
Creatinine, Ser: 1.12 mg/dL — ABNORMAL HIGH (ref 0.57–1.00)
Globulin, Total: 2.1 g/dL (ref 1.5–4.5)
Glucose: 83 mg/dL (ref 70–99)
Potassium: 4 mmol/L (ref 3.5–5.2)
Sodium: 140 mmol/L (ref 134–144)
Total Protein: 6 g/dL (ref 6.0–8.5)
eGFR: 46 mL/min/{1.73_m2} — ABNORMAL LOW (ref >59)

## 2024-03-31 LAB — CBC
Hematocrit: 33.3 % — ABNORMAL LOW (ref 34.0–46.6)
Hemoglobin: 10.3 g/dL — ABNORMAL LOW (ref 11.1–15.9)
MCH: 30.6 pg (ref 26.6–33.0)
MCHC: 30.9 g/dL — ABNORMAL LOW (ref 31.5–35.7)
MCV: 99 fL — ABNORMAL HIGH (ref 79–97)
Platelets: 263 10*3/uL (ref 150–450)
RBC: 3.37 x10E6/uL — ABNORMAL LOW (ref 3.77–5.28)
RDW: 13.4 % (ref 11.7–15.4)
WBC: 4.9 10*3/uL (ref 3.4–10.8)

## 2024-03-31 LAB — MAGNESIUM: Magnesium: 2.2 mg/dL (ref 1.6–2.3)

## 2024-03-31 LAB — TSH: TSH: 1.16 u[IU]/mL (ref 0.450–4.500)

## 2024-04-01 ENCOUNTER — Ambulatory Visit: Payer: Self-pay

## 2024-04-01 NOTE — Progress Notes (Signed)
 Kidney function has remained stable, potassium continues to remain improved, blood counts have remained stable at 10.3, magnesium  improved at 2.2, thyroid  function remains normal.  No changes to current medication regimen at this time.

## 2024-05-09 NOTE — Progress Notes (Unsigned)
 Cardiology Office Note  Date:  05/10/2024   ID:  Tricia Edwards, DOB Oct 17, 1930, MRN [id number]  PCP:  [facility]   Chief Complaint  Patient presents with   6 week follow up     Patient c/o a decrease in blood pressure this am with feeling some dizziness.     HPI:  89 y.o. female with medical history significant for  paroxysmal A-fib on anticoagulation,  history of cardioversion 2023 orthostatic hypotension with episodes of syncope and near syncope,  chronic labile blood pressure on midodrine  and fludrocortisone  daily  trigeminal neuralgia on carbamazepine   brought into the ER January 08, 2022 by her daughter for evaluation after she had several syncopal episodes that were witnessed. She presents today for office follow-up for near-syncope, syncope, atrial fibrillation  Last seen by myself in clinic 3/25 In atrial fibrillation at that time, reloaded on amiodarone  cardioversion procedure 4/25   [facility] emergency department on 03/18/2024 with worsening shortness of breath , CHF responded well to IV diuresis   In f/u, recommended to take furosemide  20 mg Monday Wednesday Friday   No SOB, unsteady gait Remains on amiodarone  200 BID Not a good candidate for beta-blockers or calcium  channel blockers  Leaving for [place]  in 10 days, goes to stay with family for few weeks   hospital dec 2024 in [place]  with admission for 10 days  PNA, confusion, CHF,   into atrial fibrillation on EKG early January 2025 In A-fib with symptoms of shortness of breath and wheezing  2/25: Continued wheezing, SOB, PMD started lasix    ER visit January 23, 2024, in atrial fibrillation Near-syncope, orthostasis on Lasix  Levaquin and steroids Witnessed syncope while sitting on toilet, several episodes that day  EKG personally reviewed by myself on todays visit EKG Interpretation Date/Time:  Monday May 10 2024 11:24:50 EDT Ventricular Rate:  55 PR Interval:  166 QRS  Duration:  90 QT Interval:  508 QTC Calculation: 485 R Axis:   -20  Text Interpretation: Sinus bradycardia Moderate voltage criteria for LVH, may be normal variant ( R in aVL , Cornell product ) When compared with ECG of 30-Mar-2024 08:11, No significant change was found Confirmed by Perla Lye [phone]) on 05/10/2024 11:30:20 AM    Admitted to the hospital July 2023 atrial fibrillation with RVR Syncopal episode with A-fib Treated with Cardizem  infusion  In the emergency room September 08, 2022 for atrial fibrillation Given diltiazem  30 mg twice daily  Seen in clinic September 10, 2022 by one of our providers On that visit was taking diltiazem  30 3 times daily On that visit was in atrial fibrillation Was started on amiodarone  400 twice daily for 1 week then 200 twice daily 1 week then 200 after that  Admitted in July 2023 for A-fib with RVR, syncope, acute heart failure.  It was felt acute heart failure exacerbated A-fib rates.  She was initially placed on Cardizem  with resolution of A-fib. Patient was given 1 dose of Lasix  20 mg.   ER visit 09/08/2022 for dizziness and A-fib.  Pulse reported 120-160.  Blood pressure was 120/92, pulse rate 126 bpm.  EKG showed A-fib with a heart rate of 121.  Patient was given injection of cortisone 5 mg and diltiazem  tablet 30 mg.  Dr. Raford was consulted who recommended Cardizem  30 mg twice a day.  Prior syncope episodes  first episode while sitting on the commode.  Patient states that she had strained to have a bowel movement and subsequently felt dizzy and  lightheaded.  Per her daughter she had a transient loss of consciousness and she took her back to her room and put her in bed. Patient had 2 more episodes while ambulatory and each time she felt dizzy and lightheaded and went limp.  Her daughter was concerned about the frequency of these episodes and states that she usually has 1 but never this many in 1 day. Patient has been on antibiotic therapy  for UTI but denies having any fever, no chills, no nausea, no vomiting, no abdominal pain, no leg swelling, no headache, no cough, no palpitations, no diaphoresis, no blurred vision, no focal deficit. She received 2 L of IV fluids in the ER Presenting in atrial fibrillation, converted to sinus rhythm in the hospital after digoxin  given Family declined amiodarone   Echocardiogram showing LVH, small LV cavity, morbid obesity, deconditioned Long history of orthostasis, near syncope and syncope dating back several years, managed by cardiology in [place]  -Discussed various strategies with him including taking water, midodrine  first thing in the morning before getting up Avoiding hot meals, large meals especially if she has not had her midodrine  -Consider abdominal binder, compression hose Staying well-hydrated Checking orthostatics on a regular basis especially before going out shopping Extra midodrine  as needed for orthostatic numbers  Zio monitor Apr 10, 2022 reviewed in detail Patient had a min HR of 57 bpm, max HR of 160 bpm, and avg HR of 71 bpm.   115 Supraventricular Tachycardia runs occurred, the run with the fastest interval lasting 5 beats with a max rate of 160 bpm, the longest lasting 14 beats with an avg rate of 92 bpm. (Not patient triggered)   Isolated SVEs were rare (<1.0%), SVE Couplets were rare (<1.0%), and SVE Triplets were rare (<1.0%). Isolated VEs were occasional (1.2%, 16466), VE Couplets were rare (<1.0%, 33), and no VE Triplets  were present. Ventricular Bigeminy and Trigeminy were present.    Patient triggered events (3) associated with normal sinus rhythm, rare PVC   PMH:   has a past medical history of Benign paroxysmal positional vertigo, CHF (congestive heart failure) (HCC), Chronic diastolic (congestive) heart failure (HCC), Chronic orthostatic hypotension, COPD (chronic obstructive pulmonary disease) (HCC), Dry eyes, GERD (gastroesophageal reflux disease),  Hepatic steatosis, History of GI bleed, syncope, Hypertension, Mild aortic stenosis by prior echocardiogram, Mild concentric left ventricular hypertrophy (LVH), Moderate mitral regurgitation by prior echocardiogram, Orthostatic hypotension, Paroxysmal A-fib (HCC), Stable angina pectoris (HCC), Stage 3a chronic kidney disease (CKD) (HCC), TIA (transient ischemic attack), Trigeminal neuralgia, Wears dentures, and Wears hearing aid in both ears.  PSH:    Past Surgical History:  Procedure Laterality Date   APPENDECTOMY     CARDIOVERSION N/A 10/31/2022   Procedure: CARDIOVERSION;  Surgeon: Perla Evalene PARAS, MD;  Location: [facility] ORS;  Service: Cardiovascular;  Laterality: N/A;   CARDIOVERSION N/A 03/17/2024   Procedure: CARDIOVERSION;  Surgeon: Perla Evalene PARAS, MD;  Location: [facility] OR[facility];  Service: Cardiovascular;  Laterality: N/A;   CATARACT EXTRACTION W/ INTRAOCULAR LENS IMPLANT Right 2016   In [place]    CATARACT EXTRACTION W/PHACO Left 03/02/2024   Procedure: PHACOEMULSIFICATION, CATARACT, WITH IOL INSERTION 12.23 00:58.9;  Surgeon: Jaye Fallow, MD;  Location: [facility];  Service: Ophthalmology;  Laterality: Left;   KNEE SURGERY      Current Outpatient Medications  Medication Sig Dispense Refill   amiodarone  (PACERONE ) 200 MG tablet Take 1 tablet (200 mg total) by mouth 2 (two) times daily. 180 tablet 3   atorvastatin  (LIPITOR) 10 MG tablet Take  10 mg by mouth daily.     Calcium  Carb-Cholecalciferol  (CALCIUM  600/VITAMIN D  PO) Take 1 tablet by mouth daily. 600 mg / 20 mcg     cyanocobalamin 1000 MCG tablet Take 1,000 mcg by mouth daily.     ELIQUIS  5 MG TABS tablet Take 5 mg by mouth 2 (two) times daily.     ferrous sulfate  324 MG TBEC Take 324 mg by mouth 2 (two) times daily.     fludrocortisone  (FLORINEF ) 0.1 MG tablet Take 0.1 mg by mouth 2 (two) times daily.     furosemide  (LASIX ) 20 MG tablet Take 1 tablet (20 mg total) by mouth every Monday, Wednesday, and Friday. May  take 1 tablet ( 20 MG) daily as needed 60 tablet 1   gabapentin  (NEURONTIN ) 300 MG capsule Take 300-600 mg by mouth 3 (three) times daily. Take 300 in the morning and afternoon and 600 mg at bedtime     ipratropium-albuterol  (DUONEB) 0.5-2.5 (3) MG/3ML SOLN Take 3 mLs by nebulization every 8 (eight) hours as needed (shortness of breath).     midodrine  (PROAMATINE ) 10 MG tablet Take 10 mg by mouth 3 (three) times daily. If Bp is 150-160 take 5 mg of over take nothing     Multiple Vitamins-Minerals (PRESERVISION AREDS) TABS Take 1 tablet by mouth 2 (two) times daily.     omeprazole  (PRILOSEC) 20 MG capsule Take 1 capsule (20 mg total) by mouth 2 (two) times daily before a meal. 60 capsule 0   Oxcarbazepine  (TRILEPTAL ) 300 MG tablet Take 300 mg by mouth 2 (two) times daily.     polyethylene glycol powder (MIRALAX ) 17 GM/SCOOP powder Take 17 g by mouth daily as needed for mild constipation.     potassium chloride  SA (KLOR-CON  M) 20 MEQ tablet Take 20 mEq by mouth in the morning.     senna (SENOKOT) 8.6 MG TABS tablet Take 1 tablet by mouth in the morning.     prednisoLONE-Moxifloxacin  1-0.5 % SOLN Place 1 drop into both eyes 2 (two) times daily. With Bromfenac 0.075% (Patient not taking: Reported on 05/10/2024)     No current facility-administered medications for this visit.    Allergies:   Pneumovax [pneumococcal polysaccharide vaccine]   Social History:  The patient  reports that she quit smoking about 40 years ago. Her smoking use included cigarettes. She started smoking about 75 years ago. She has a 3.5 pack-year smoking history. She has never used smokeless tobacco. She reports that she does not currently use alcohol. She reports that she does not use drugs.   Family History:   family history includes Intracerebral hemorrhage in her brother; Kidney cancer in her sister.   Review of Systems: Review of Systems  Constitutional: Negative.   HENT: Negative.    Respiratory: Negative.     Cardiovascular: Negative.   Gastrointestinal: Negative.   Musculoskeletal: Negative.   Neurological: Negative.   Psychiatric/Behavioral: Negative.    All other systems reviewed and are negative.   PHYSICAL EXAM: VS:  BP (!) 100/44 (BP Location: Left Arm, Patient Position: Sitting, Cuff Size: Normal)   Pulse (!) 55   Ht 5' 7 (1.702 m)   Wt 174 lb 8 oz (79.2 kg)   SpO2 96%   BMI 27.33 kg/m  , BMI Body mass index is 27.33 kg/m. Constitutional:  oriented to person, place, and time. No distress.  HENT:  Head: Grossly normal Eyes:  no discharge. No scleral icterus.  Neck: No JVD, no carotid bruits  Cardiovascular:  Regular rate and rhythm, no murmurs appreciated Pulmonary/Chest: Clear to auscultation bilaterally, no wheezes or rales Abdominal: Soft.  no distension.  no tenderness.  Musculoskeletal: Normal range of motion Neurological:  normal muscle tone. Coordination normal. No atrophy Skin: Skin warm and dry Psychiatric: normal affect, pleasant  Recent Labs: 03/18/2024: B Natriuretic Peptide 396.1 03/30/2024: ALT 36; BUN 17; Creatinine, Ser 1.12; Hemoglobin 10.3; Magnesium  2.2; Platelets 263; Potassium 4.0; Sodium 140; TSH 1.160   Lipid Panel No results found for: CHOL, HDL, LDLCALC, TRIG   Wt Readings from Last 3 Encounters:  05/10/24 174 lb 8 oz (79.2 kg)  03/30/24 174 lb 9.6 oz (79.2 kg)  03/24/24 173 lb 3.2 oz (78.6 kg)     ASSESSMENT AND PLAN:  Problem List Items Addressed This Visit       Cardiology Problems   Chronic orthostatic hypotension   Relevant Orders   EKG 12-Lead (Completed)   Chronic diastolic congestive heart failure (HCC)   Relevant Orders   EKG 12-Lead (Completed)   HTN (hypertension)   Relevant Orders   EKG 12-Lead (Completed)   Paroxysmal atrial fibrillation (HCC) - Primary   Relevant Orders   EKG 12-Lead (Completed)     Other   Iron deficiency anemia   Syncope and collapse   Stage 3 chronic kidney disease (HCC)   Other Visit  Diagnoses       Hyperlipidemia, mixed         Mild aortic stenosis         Orthostatic hypotension           Paroxysmal atrial fibrillation Several cardioversions in the past, most recently April 2025 Maintaining normal sinus rhythm, continue amiodarone  200 twice daily Not a good candidate for beta-blockers, calcium  channel blockers given hypotension = Continue Eliquis   Orthostatic hypotension Long history of orthostasis On fludrocortisone  and midodrine  10 mg 3 times daily  Anemia On Eliquis , hemoglobin stable  Diastolic CHF In the setting of recurrent atrial fibrillation Continue Lasix  20 mg 3 days a week Stable BMP  Signed, Velinda Lunger, M.D., Ph.D. [facility] [place], [place] [phone]

## 2024-05-10 ENCOUNTER — Ambulatory Visit: Attending: Cardiovascular Disease | Admitting: Cardiovascular Disease

## 2024-05-10 VITALS — BP 100/44 | HR 55 | Ht 67.0 in | Wt 174.5 lb

## 2024-05-10 DIAGNOSIS — N1832 Chronic kidney disease, stage 3b: Secondary | ICD-10-CM | POA: Insufficient documentation

## 2024-05-10 DIAGNOSIS — I951 Orthostatic hypotension: Secondary | ICD-10-CM | POA: Diagnosis present

## 2024-05-10 DIAGNOSIS — I10 Essential (primary) hypertension: Secondary | ICD-10-CM | POA: Diagnosis not present

## 2024-05-10 DIAGNOSIS — I480 Paroxysmal atrial fibrillation: Secondary | ICD-10-CM | POA: Insufficient documentation

## 2024-05-10 DIAGNOSIS — I5032 Chronic diastolic (congestive) heart failure: Secondary | ICD-10-CM | POA: Diagnosis not present

## 2024-05-10 DIAGNOSIS — R55 Syncope and collapse: Secondary | ICD-10-CM | POA: Insufficient documentation

## 2024-05-10 DIAGNOSIS — D509 Iron deficiency anemia, unspecified: Secondary | ICD-10-CM | POA: Insufficient documentation

## 2024-05-10 DIAGNOSIS — E782 Mixed hyperlipidemia: Secondary | ICD-10-CM | POA: Insufficient documentation

## 2024-05-10 DIAGNOSIS — I350 Nonrheumatic aortic (valve) stenosis: Secondary | ICD-10-CM | POA: Insufficient documentation

## 2024-05-10 NOTE — Patient Instructions (Addendum)
 Medication Instructions:  No changes  If you need a refill on your cardiac medications before your next appointment, please call your pharmacy.    Lab work: No new labs needed   Testing/Procedures: No new testing needed   Follow-Up: At [facility], you and your health needs are our priority.  As part of our continuing mission to provide you with exceptional heart care, we have created designated Provider Care Teams.  These Care Teams include your primary Cardiologist (physician) and Advanced Practice Providers (APPs -  Physician Assistants and Nurse Practitioners) who all work together to provide you with the care you need, when you need it.  You will need a follow up appointment in 6 months  Providers on your designated Care Team:   Nicolasa Ducking, NP Eula Listen, PA-C Cadence Fransico Michael, [place]  COVID-19 Vaccine Information can be found at: [email or web address] For questions related to vaccine distribution or appointments, please email [email or web address] or call [phone].

## 2024-05-14 ENCOUNTER — Ambulatory Visit: Admitting: Family Medicine

## 2024-05-14 ENCOUNTER — Encounter: Payer: Self-pay | Admitting: Family Medicine

## 2024-05-14 VITALS — BP 106/56 | HR 49 | Resp 16 | Ht 67.0 in | Wt 174.0 lb

## 2024-05-14 DIAGNOSIS — E042 Nontoxic multinodular goiter: Secondary | ICD-10-CM

## 2024-05-14 DIAGNOSIS — R718 Other abnormality of red blood cells: Secondary | ICD-10-CM

## 2024-05-14 DIAGNOSIS — H8110 Benign paroxysmal vertigo, unspecified ear: Secondary | ICD-10-CM

## 2024-05-14 DIAGNOSIS — D649 Anemia, unspecified: Secondary | ICD-10-CM

## 2024-05-14 DIAGNOSIS — E876 Hypokalemia: Secondary | ICD-10-CM

## 2024-05-14 DIAGNOSIS — E559 Vitamin D deficiency, unspecified: Secondary | ICD-10-CM

## 2024-05-14 DIAGNOSIS — E538 Deficiency of other specified B group vitamins: Secondary | ICD-10-CM

## 2024-05-14 DIAGNOSIS — Z8719 Personal history of other diseases of the digestive system: Secondary | ICD-10-CM | POA: Insufficient documentation

## 2024-05-14 DIAGNOSIS — N1831 Chronic kidney disease, stage 3a: Secondary | ICD-10-CM

## 2024-05-14 DIAGNOSIS — I4819 Other persistent atrial fibrillation: Secondary | ICD-10-CM

## 2024-05-14 DIAGNOSIS — I951 Orthostatic hypotension: Secondary | ICD-10-CM | POA: Diagnosis not present

## 2024-05-14 DIAGNOSIS — G500 Trigeminal neuralgia: Secondary | ICD-10-CM

## 2024-05-14 DIAGNOSIS — I2510 Atherosclerotic heart disease of native coronary artery without angina pectoris: Secondary | ICD-10-CM

## 2024-05-14 DIAGNOSIS — Z7689 Persons encountering health services in other specified circumstances: Secondary | ICD-10-CM

## 2024-05-14 MED ORDER — MECLIZINE HCL 12.5 MG PO TABS: 12.5000 mg | ORAL_TABLET | Freq: Three times a day (TID) | ORAL | 1 refills | Status: AC | PRN

## 2024-05-14 NOTE — Assessment & Plan Note (Signed)
 Chronic, stable. Taking gabapentin  300 mg in the morning and 600 mg in the evening (recent stop of 300 mg midday by Dr. Lane). Taking oxcarbazepine  300 mg bid

## 2024-05-14 NOTE — Assessment & Plan Note (Addendum)
 Taking one tablet potassium cl 20 mEq in the morning.

## 2024-05-14 NOTE — Progress Notes (Addendum)
 New patient visit   Patient: Tricia Edwards   DOB: [birth year]   89 y.o. Female  MRN: [id number] Visit Date: 05/14/2024  Today's healthcare provider: LAURAINE LOISE BUOY, DO   Chief Complaint  Patient presents with   Establish Care    Previous provider: [facility]. Dr. Francie in [place]   Dizziness   Subjective    Tricia Edwards is a 89 y.o. female who presents today as a new patient to establish care.  HPI HPI     Establish Care    Additional comments: Previous provider: [facility]. Dr. Francie in [place]      Last edited by Wilfred Hargis RAMAN, CMA on 05/14/2024  2:33 PM.      Tricia Edwards is a 89 year old female with chronic orthostatic hypotension and atrial fibrillation who presents for establishment of care and management of dizziness. She is accompanied by her daughter.  She experiences frequent episodes of dizziness and lightheadedness, particularly when standing, due to chronic orthostatic hypotension. Her blood pressure has been recorded as low as 106 mmHg, raising concerns about further drops when standing. She is currently taking midodrine  10 mg three times a day and fludrocortisone  0.1 mg twice a day to manage her symptoms. Despite this regimen, she experiences episodes of dizziness daily, sometimes more than once. The use of Lasix  on Monday, Wednesday, and Friday exacerbates her condition, leading to more concerning drops in blood pressure on these days.  She has a history of atrial fibrillation and underwent a cardioversion procedure, which has helped maintain her heart rhythm. She is on amiodarone  and Eliquis  twice a day for atrial fibrillation. No recent episodes of abnormal heartbeat or chest pain are reported; however, her daughter reports she historically doesn't notice when she is in atrial fibrillation.  She has a history of chronic heart failure and was recently hospitalized for acute on chronic heart failure. She takes Lasix  on Monday,  Wednesday, and Friday. No recent swelling in legs or shortness of breath is reported.  She does wear compression stockings and recently decreased the level of compression to a mid-range level, at the recommendation of her cardiologist.  She notes that her legs constantly felt numb prior to the change.  She has a history of left-sided trigeminal neuralgia, for which she underwent radiation treatment approximately eight months ago. Since then, her symptoms have significantly improved, and she is currently on gabapentin  and oxcarbazepine , with her dosage being gradually reduced by her neurologist, Dr. Lane.  She has a history of anemia, previously requiring transfusions. Her hemoglobin levels have been stable recently, and she takes iron supplements twice daily. She underwent an endoscopy about a year ago for upper GI bleeding, during which they reports some veins were cauterized. She also reports a history of low potassium, which is currently managed with potassium supplements once daily.  She has a history of osteoporosis and received Reclast in 2020. She is not currently on any specific treatment for osteoporosis. She experiences shoulder pain, which she attributes to her sleeping position, and does not use a pillow. She also reports occasional constipation, managed with Miralax  and dietary changes.  She has a history of fatty liver and mild interstitial thickening in the lungs. She denies any significant respiratory symptoms. She also has a history of thyroid  nodules, but no follow-up has been conducted as her thyroid  function tests have been normal.     Past Medical History:  Diagnosis Date   Anemia  Arthritis    Asthma    Benign paroxysmal positional vertigo    Blood transfusion without reported diagnosis    Cancer (HCC)    basil cell nose   Cataract    bilateral   CHF (congestive heart failure) (HCC)    Chronic diastolic (congestive) heart failure (HCC)    Chronic orthostatic  hypotension    COPD (chronic obstructive pulmonary disease) (HCC)    Dry eyes    GERD (gastroesophageal reflux disease)    Heart murmur    Hepatic steatosis    History of GI bleed    HTN (hypertension) 05/31/2023   Hx of syncope    Hypertension    Mild aortic stenosis by prior echocardiogram    Mild concentric left ventricular hypertrophy (LVH)    Moderate mitral regurgitation by prior echocardiogram    Orthostatic hypotension    Paroxysmal A-fib (HCC)    Stable angina pectoris (HCC)    Stage 3a chronic kidney disease (CKD) (HCC)    TIA (transient ischemic attack)    no deficits   Trigeminal neuralgia    Wears dentures    full upper and lower   Wears hearing aid in both ears    has both.  Only wears right   Past Surgical History:  Procedure Laterality Date   APPENDECTOMY     CARDIOVERSION N/A 10/31/2022   Procedure: CARDIOVERSION;  Surgeon: Perla Evalene PARAS, MD;  Location: [facility];  Service: Cardiovascular;  Laterality: N/A;   CARDIOVERSION N/A 03/17/2024   Procedure: CARDIOVERSION;  Surgeon: Perla Evalene PARAS, MD;  Location: [facility];  Service: Cardiovascular;  Laterality: N/A;   CATARACT EXTRACTION W/ INTRAOCULAR LENS IMPLANT Right 2016   In [place]    CATARACT EXTRACTION W/PHACO Left 03/02/2024   Procedure: PHACOEMULSIFICATION, CATARACT, WITH IOL INSERTION 12.23 00:58.9;  Surgeon: Jaye Fallow, MD;  Location: [facility];  Service: Ophthalmology;  Laterality: Left;   EYE SURGERY     JOINT REPLACEMENT     KNEE SURGERY     Family Status  Relation Name Status   Mother Unknown Deceased   Father Unknown Deceased   Sister  (Not Specified)   Brother  (Not Specified)  No partnership data on file   Family History  Problem Relation Age of Onset   Kidney cancer Sister    Intracerebral hemorrhage Brother    Social History   Socioeconomic History   Marital status: Widowed    Spouse name: Not on file   Number of children: Not on file   Years of  education: Not on file   Highest education level: Not on file  Occupational History   Not on file  Tobacco Use   Smoking status: Former    Current packs/day: 0.00    Average packs/day: 0.1 packs/day for 35.0 years (3.5 ttl pk-yrs)    Types: Cigarettes    Start date: 42    Quit date: 49    Years since quitting: 40.5   Smokeless tobacco: Never  Vaping Use   Vaping status: Never Used  Substance and Sexual Activity   Alcohol use: Not Currently   Drug use: Never   Sexual activity: Not Currently  Other Topics Concern   Not on file  Social History Narrative   Not on file   Social Drivers of Health   Financial Resource Strain: Low Risk  (05/14/2024)   Overall Financial Resource Strain (CARDIA)    Difficulty of Paying Living Expenses: Not hard at all  Food Insecurity: No Food Insecurity (  04/21/2024)   Received from [facility]   Hunger Vital Sign    Within the past 12 months, you worried that your food would run out before you got the money to buy more.: Never true    Within the past 12 months, the food you bought just didn't last and you didn't have money to get more.: Never true  Transportation Needs: No Transportation Needs (04/21/2024)   Received from [facility] - Transportation    In the past 12 months, has lack of transportation kept you from medical appointments or from getting medications?: No    Lack of Transportation (Non-Medical): No  Physical Activity: Not on File (12/24/2019)   Received from [facility]   Physical Activity    Physical Activity: 0  Stress: Not on File (12/24/2019)   Received from [facility]   Stress    Stress: 0  Social Connections: Unknown (03/19/2024)   Social Connection and Isolation Panel    Frequency of Communication with Friends and Family: More than three times a week    Frequency of Social Gatherings with Friends and Family: Once a week    Attends Religious Services: Never    Database administrator or  Organizations: Not on file    Attends Banker Meetings: Never    Marital Status: Patient unable to answer   Outpatient Medications Prior to Visit  Medication Sig   amiodarone  (PACERONE ) 200 MG tablet Take 1 tablet (200 mg total) by mouth 2 (two) times daily.   atorvastatin  (LIPITOR) 10 MG tablet Take 10 mg by mouth daily.   Calcium  Carb-Cholecalciferol  (CALCIUM  600/VITAMIN D  PO) Take 1 tablet by mouth daily. 600 mg / 20 mcg   cyanocobalamin 1000 MCG tablet Take 1,000 mcg by mouth daily.   ELIQUIS  5 MG TABS tablet Take 5 mg by mouth 2 (two) times daily.   ferrous sulfate  324 MG TBEC Take 324 mg by mouth 2 (two) times daily.   fludrocortisone  (FLORINEF ) 0.1 MG tablet Take 0.1 mg by mouth 2 (two) times daily.   furosemide  (LASIX ) 20 MG tablet Take 1 tablet (20 mg total) by mouth every Monday, Wednesday, and Friday. May take 1 tablet ( 20 MG) daily as needed   gabapentin  (NEURONTIN ) 300 MG capsule Take 300-600 mg by mouth 3 (three) times daily. Take 300 in the morning and afternoon and 600 mg at bedtime (Patient taking differently: Take 300-600 mg by mouth 3 (three) times daily. Take 300 in the morning and 600 mg at bedtime)   ipratropium-albuterol  (DUONEB) 0.5-2.5 (3) MG/3ML SOLN Take 3 mLs by nebulization every 8 (eight) hours as needed (shortness of breath).   midodrine  (PROAMATINE ) 10 MG tablet Take 10 mg by mouth 3 (three) times daily. If Bp is 150-160 take 5 mg of over take nothing   Multiple Vitamins-Minerals (PRESERVISION AREDS) TABS Take 1 tablet by mouth 2 (two) times daily.   omeprazole  (PRILOSEC) 20 MG capsule Take 1 capsule (20 mg total) by mouth 2 (two) times daily before a meal.   Oxcarbazepine  (TRILEPTAL ) 300 MG tablet Take 300 mg by mouth 2 (two) times daily.   polyethylene glycol powder (MIRALAX ) 17 GM/SCOOP powder Take 17 g by mouth daily as needed for mild constipation.   potassium chloride  SA (KLOR-CON  M) 20 MEQ tablet Take 20 mEq by mouth in the morning.    prednisoLONE-Moxifloxacin  1-0.5 % SOLN Place 1 drop into both eyes 2 (two) times daily. With Bromfenac 0.075%  senna (SENOKOT) 8.6 MG TABS tablet Take 1 tablet by mouth in the morning.   No facility-administered medications prior to visit.   Allergies  Allergen Reactions   Pneumovax [Pneumococcal Polysaccharide Vaccine]     Other reaction(s): Severe arm swelling    Immunization History  Administered Date(s) Administered   Influenza, High Dose Seasonal PF 09/22/2015, 08/15/2018, 08/21/2021, 12/09/2023   Influenza,inj,Quad PF,6+ Mos 08/13/2016, 09/29/2017, 08/23/2020   Influenza,inj,quad, With Preservative 08/16/2019   Influenza-Unspecified 09/18/2011, 10/08/2012, 07/29/2013, 08/10/2014, 09/22/2015, 08/15/2018, 09/23/2022   PFIZER(Purple Top)SARS-COV-2 Vaccination 12/24/2019, 01/14/2020, 11/10/2020   Pneumococcal Conjugate-13 10/20/2013   Pneumococcal Polysaccharide-23 08/03/2003   Td 08/03/2003, 11/08/2015   Td (Adult),5 Lf Tetanus Toxid, Preservative Free 11/08/2015   Zoster, Live 10/18/2008    Health Maintenance  Topic Date Due   COVID-19 Vaccine (4 - 2024-25 season) 08/18/2024 (Originally 07/20/2023)   DEXA SCAN  05/14/2025 (Originally 06/17/1997)   Zoster Vaccines- Shingrix (1 of 2) 05/14/2025 (Originally 06/18/1949)   INFLUENZA VACCINE  06/18/2024   Medicare Annual Wellness (AWV)  12/08/2024   DTaP/Tdap/Td (4 - Tdap) 11/07/2025   Pneumococcal Vaccine: 50+ Years  Completed   Hepatitis B Vaccines  Aged Out   HPV VACCINES  Aged Out   Meningococcal B Vaccine  Aged Out    Patient Care Team: Loveda Colaizzi, Lauraine SAILOR, DO as PCP - General (Family Medicine)    Objective    BP (!) 106/56 (BP Location: Left Arm, Patient Position: Sitting, Cuff Size: Normal)   Pulse (!) 49   Resp 16   Ht 5' 7 (1.702 m)   Wt 174 lb (78.9 kg)   SpO2 97%   BMI 27.25 kg/m     Physical Exam Vitals and nursing note reviewed.  Constitutional:      General: She is not in acute distress.     Appearance: Normal appearance.  HENT:     Head: Normocephalic and atraumatic.  Eyes:     General: No scleral icterus.    Conjunctiva/sclera: Conjunctivae normal.  Cardiovascular:     Rate and Rhythm: Normal rate.  Pulmonary:     Effort: Pulmonary effort is normal.  Neurological:     Mental Status: She is alert and oriented to person, place, and time. Mental status is at baseline.  Psychiatric:        Mood and Affect: Mood normal.        Behavior: Behavior normal.      Depression Screen    05/14/2024    2:32 PM 04/29/2023    2:51 PM  PHQ 2/9 Scores  PHQ - 2 Score 0 0  PHQ- 9 Score 3    No results found for any visits on 05/14/24.  Assessment & Plan     Establishing care with new doctor, encounter for  Chronic orthostatic hypotension  Benign paroxysmal positional vertigo, unspecified laterality -     Meclizine  HCl; Take 1 tablet (12.5 mg total) by mouth 3 (three) times daily as needed for dizziness.  Dispense: 30 tablet; Refill: 1  Persistent atrial fibrillation (HCC)  History of GI bleed Assessment & Plan: Had endocsopy approx. 2024 and hemoglobin has improved since.   Anemia, unspecified type  Elevated MCV  Atherosclerosis of native coronary artery of native heart without angina pectoris  Chronic kidney disease, stage 3a (HCC)  Vitamin B12 deficiency -     Vitamin B12  Vitamin D  deficiency -     VITAMIN D  25 Hydroxy (Vit-D Deficiency, Fractures)  Multiple thyroid  nodules  Hypokalemia Assessment & Plan:  Taking one tablet potassium cl 20 mEq in the morning.   Trigeminal neuralgia of left side of face Assessment & Plan: Chronic, stable. Taking gabapentin  300 mg in the morning and 600 mg in the evening (recent stop of 300 mg midday by Dr. Lane). Taking oxcarbazepine  300 mg bid  - previously had radiation done to treat it as well, when in [place] , approx. 8 months ago (OCT2024).  Pain has improved since then. Medications being weaned by Dr.  Lane.       Establishing care with a new doctor, encounter for Up-to-date on COVID-19 and flu vaccinations. Pending mammogram and colonoscopy screenings. Shingles vaccine coverage may have changed. - Ensure mammogram is completed as scheduled. - Consider re-evaluating shingles vaccine coverage under new Medicare guidelines. - Continue regular colonoscopy screenings as per previous schedule.  Chronic Orthostatic Hypotension; history benign paroxysmal positional vertigo Chronic orthostatic hypotension with low blood pressure exacerbated by Lasix , increasing fall risk. - Continue midodrine  10 mg TID. - Continue fludrocortisone  BID. - Monitor blood pressure closely, especially on Lasix  days. - Consider discussing potential reduction in Eliquis  dosage with cardiologist due to fall risk.  Persistent Atrial Fibrillation, status post cardioversion; history of GI bleed Atrial fibrillation post-cardioversion, currently in sinus rhythm. Eliquis  dose reduction considered given age and GI bleed history but not indicated by creatinine levels and weight. Recommended discussion with cardiology regarding potential appropriateness of decreased dose given regular hypotensive episodes. Will defer to specialist management. - Continue amiodarone  as per cardiologist's advice. - Continue Eliquis  5 mg BID. - Monitor for AFib recurrence.  Chronic Heart Failure with preserved ejection fraction Chronic heart failure with preserved ejection fraction, well-managed with no recent symptoms. Pulmonary congestion likely cause of lung changes noted on previous imaging, as there is no consistent indication of lung changes suggestive of COPD. - Continue current heart failure management regimen. - Monitor for fluid retention or worsening symptoms. - Defer to cardiology management.  Atherosclerosis Continue to optimize risk factors.  Chronic kidney disease stage 3a Continue to optimize risk factors.  Chronic  Anemia; elevated MCV Chronic anemia stable with iron supplementation and previous GI intervention. - Continue iron supplementation. - Monitor hemoglobin levels regularly. - Will recheck vitamin B12 level given history of deficiency  Trigeminal Neuralgia Trigeminal neuralgia improved post-radiation. Gabapentin  and oxcarbazepine  to be weaned under Dr. Clement guidance. - Continue gabapentin  and oxcarbazepine  as prescribed. - Follow up with Dr. Lane in September for dosage adjustment.  Osteoporosis Osteoporosis with previous Reclast treatment, no recent fractures or pain. - Monitor bone health and consider future management as needed. Declined DEXA scan.  Thyroid  Nodules Thyroid  nodules with normal function tests, no further imaging due to age and lack of symptoms. - Monitor thyroid  function tests as needed. - Patient and her daughter (who is her caregiver) declined further imaging given overall stability and desire to avoid invasive interventions where possible.     Return in about 2 months (around 07/14/2024) for Chronic f/u, bloodwork.     I discussed the assessment and treatment plan with the patient  The patient was provided an opportunity to ask questions and all were answered. The patient agreed with the plan and demonstrated an understanding of the instructions.   The patient was advised to call back or seek an in-person evaluation if the symptoms worsen or if the condition fails to improve as anticipated.    LAURAINE LOISE BUOY, DO  [facility] [phone] (phone) [phone] (fax)  [facility]

## 2024-05-14 NOTE — Assessment & Plan Note (Signed)
 Had endocsopy approx. 2024 and hemoglobin has improved since.

## 2024-05-14 NOTE — Assessment & Plan Note (Addendum)
 Chronic, stable. Taking gabapentin  300 mg in the morning and 600 mg in the evening (recent stop of 300 mg midday by Dr. Lane). Taking oxcarbazepine  300 mg bid  - previously had radiation done to treat it as well, when in [place] , approx. 8 months ago (OCT2024).  Pain has improved since then. Medications being weaned by Dr. Lane.

## 2024-07-03 ENCOUNTER — Telehealth: Payer: Self-pay | Admitting: Student

## 2024-07-03 MED ORDER — MIDODRINE HCL 10 MG PO TABS: 10.0000 mg | ORAL_TABLET | Freq: Three times a day (TID) | ORAL | 0 refills | Status: AC

## 2024-07-03 NOTE — Telephone Encounter (Signed)
   Patient's daughter called answering service requesting refill of Midodrine .  Called and spoke with daughter Ronal (okay per DPR).  Daughter is requesting emergency refill of her Midodrine .  Patient is currently up in [place]  with her other daughter and is about to run out of her Midodrine .  She also follows with a Cardiologist (Dr. Deretha) in [place]  and he is the one that actually fills this.  They have tried to contact his office over the last couple days but have not been able to get this filled yet.  Therefore, daughter was calling requesting a short prescription until they can work it out with Dr. Gleda office. Will provide 2-week prescription with 0 refills.  This should give them plenty of time to get future refills worked out with Dr. Gleda office.  Will send to [facility] here in [place]  and then they can transfer the prescription to a [facility] in [place] .  Gavin Faivre E Tyler Cubit, PA-C 07/03/2024 12:37 PM

## 2024-07-14 ENCOUNTER — Ambulatory Visit: Admitting: Family Medicine

## 2024-12-20 ENCOUNTER — Ambulatory Visit: Admitting: Cardiovascular Disease

## 2025-03-08 ENCOUNTER — Ambulatory Visit: Admitting: Cardiovascular Disease
